# Patient Record
Sex: Male | Born: 1965 | Race: Black or African American | Hispanic: No | Marital: Single | Smoking: Former smoker
Health system: Southern US, Community
[De-identification: ages and names within clinical notes are randomized; demographics above are authoritative.]

## PROBLEM LIST (undated history)

## (undated) DIAGNOSIS — J45909 Unspecified asthma, uncomplicated: Secondary | ICD-10-CM

## (undated) DIAGNOSIS — K429 Umbilical hernia without obstruction or gangrene: Secondary | ICD-10-CM

## (undated) DIAGNOSIS — J189 Pneumonia, unspecified organism: Secondary | ICD-10-CM

## (undated) DIAGNOSIS — M1990 Unspecified osteoarthritis, unspecified site: Secondary | ICD-10-CM

## (undated) DIAGNOSIS — I10 Essential (primary) hypertension: Secondary | ICD-10-CM

## (undated) DIAGNOSIS — M4306 Spondylolysis, lumbar region: Secondary | ICD-10-CM

## (undated) DIAGNOSIS — C61 Malignant neoplasm of prostate: Secondary | ICD-10-CM

## (undated) DIAGNOSIS — E119 Type 2 diabetes mellitus without complications: Secondary | ICD-10-CM

## (undated) HISTORY — DX: Unspecified osteoarthritis, unspecified site: M19.90

## (undated) HISTORY — DX: Unspecified asthma, uncomplicated: J45.909

## (undated) HISTORY — DX: Essential (primary) hypertension: I10

---

## 1997-05-27 HISTORY — PX: SPINE SURGERY: SHX786

## 2016-06-20 ENCOUNTER — Ambulatory Visit: Payer: Self-pay | Admitting: Orthopedic Surgery

## 2016-06-27 ENCOUNTER — Encounter: Payer: Self-pay | Admitting: Orthopedic Surgery

## 2016-06-27 ENCOUNTER — Ambulatory Visit (INDEPENDENT_AMBULATORY_CARE_PROVIDER_SITE_OTHER): Payer: Self-pay | Admitting: Orthopedic Surgery

## 2016-06-27 ENCOUNTER — Ambulatory Visit (INDEPENDENT_AMBULATORY_CARE_PROVIDER_SITE_OTHER): Payer: 59

## 2016-06-27 VITALS — BP 169/111 | HR 93 | Wt 312.0 lb

## 2016-06-27 DIAGNOSIS — M25562 Pain in left knee: Secondary | ICD-10-CM

## 2016-06-27 DIAGNOSIS — M76899 Other specified enthesopathies of unspecified lower limb, excluding foot: Secondary | ICD-10-CM | POA: Diagnosis not present

## 2016-06-27 MED ORDER — PREDNISONE 10 MG PO TABS: 10.0000 mg | ORAL_TABLET | Freq: Two times a day (BID) | ORAL | 1 refills | Status: DC

## 2016-06-27 NOTE — Progress Notes (Signed)
Chief Complaint  Patient presents with  . 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 The knee is making noise. It's a dull pain is moderate in severity   Review of Systems  Musculoskeletal: Positive for back pain, joint pain and myalgias.  Neurological: Positive for tingling and sensory change.  All other systems reviewed and are negative.   Medical problems reported by the patient none  Surgeries reported by the patient 1998 back surgery  Family history reported by the patient no history of diabetes hypertension or heart disease Social History  Substance Use Topics  . Smoking status: Not on file  . Smokeless tobacco: Not on file  . Alcohol use Not on file   No outpatient prescriptions have been marked as taking for the 06/27/16 encounter (Office Visit) with Carole Civil, MD.    BP (!) 169/111   Pulse 93   Wt (!) 312 lb (141.5 kg)   Physical Exam  Constitutional: He is oriented to person, place, and time. He appears well-developed and well-nourished. No distress.  Cardiovascular: Normal rate and intact distal pulses.   Neurological: He is alert and oriented to person, place, and time.  Skin: Skin is warm and dry. No rash noted. He is not diaphoretic. No erythema. No pallor.  Psychiatric: He has a normal mood and affect. His behavior is normal. Judgment and thought content normal.    Ortho Exam No assistive gait needed no limping noted  Right knee is tender at the superior pole of patella with normal range of motion. All ligaments were tested and were stable. Strength was normal in the quadriceps tendon. Had normal skin without rash. Distally there was no peripheral edema normal pulses and sensation was intact and normal.  Right knee full range of motion and ligament  stable to anterior posterior drawer testing as well as collateral ligaments  ASSESSMENT: My personal interpretation of the images:  Is that he has moderate arthritis of the medial compartment  My diagnosis is quadriceps tendinitis    PLAN Plan is for prednisone 10 mg twice a day for 10 days of no improvement after 6 weeks and return for reevaluation  Arther Abbott, MD 06/27/2016 2:55 PM  .meds

## 2016-07-18 ENCOUNTER — Ambulatory Visit (INDEPENDENT_AMBULATORY_CARE_PROVIDER_SITE_OTHER): Payer: 59 | Admitting: Family Medicine

## 2016-07-18 ENCOUNTER — Encounter: Payer: Self-pay | Admitting: Family Medicine

## 2016-07-18 VITALS — BP 164/100 | HR 92 | Temp 98.8°F | Resp 20 | Ht 69.5 in | Wt 311.1 lb

## 2016-07-18 DIAGNOSIS — Z7689 Persons encountering health services in other specified circumstances: Secondary | ICD-10-CM

## 2016-07-18 DIAGNOSIS — Z1211 Encounter for screening for malignant neoplasm of colon: Secondary | ICD-10-CM | POA: Diagnosis not present

## 2016-07-18 DIAGNOSIS — Z23 Encounter for immunization: Secondary | ICD-10-CM

## 2016-07-18 DIAGNOSIS — M47816 Spondylosis without myelopathy or radiculopathy, lumbar region: Secondary | ICD-10-CM | POA: Insufficient documentation

## 2016-07-18 DIAGNOSIS — R030 Elevated blood-pressure reading, without diagnosis of hypertension: Secondary | ICD-10-CM | POA: Diagnosis not present

## 2016-07-18 NOTE — Progress Notes (Signed)
Chief Complaint  Patient presents with  . 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  Is overweight and this is discussed.  No colonoscopy in the past, this is scheduled Shots not up to date.  Getting a TdaP and a flu shot today  Recent visit to ortho for knee pain and is improving on a course of prednisone    Patient Active Problem List   Diagnosis Date Noted  . Morbid obesity ([facility]) 07/18/2016  . Elevated blood pressure reading 07/18/2016    Outpatient Encounter Prescriptions as of 07/18/2016  Medication Sig  . predniSONE (DELTASONE) 10 MG tablet Take 1 tablet (10 mg total) by mouth 2 (two) times daily with a meal.   No facility-administered encounter medications on file as of 07/18/2016.     Past Medical History:  Diagnosis Date  . Arthritis   . Asthma   . Hypertension     Past Surgical History:  Procedure Laterality Date  . SPINE SURGERY  05/1997   back fusion    Social History   Social History  . Marital status: Single    Spouse name: N/A  . Number of children: 0  . Years of education: 12   Occupational History  . truck driver    Social History Main Topics  . Smoking status: Never Smoker  . Smokeless tobacco: Never Used  . Alcohol use No  . Drug use: No  . Sexual activity: Not Currently   Other Topics Concern  . Not on file   Social History Narrative   Lives with brother    Family History  Problem Relation Age of Onset  . Heart disease Mother   . Stroke Mother   . Hypertension Mother   . Asthma Father   . Hypertension Brother     Review of Systems  Constitutional: Negative for chills, fever and weight loss.  HENT: Negative for congestion and hearing loss.   Eyes: Negative for blurred vision and pain.  Respiratory: Negative for cough and shortness of  breath.   Cardiovascular: Negative for chest pain and leg swelling.  Gastrointestinal: Negative for abdominal pain, constipation, diarrhea and heartburn.  Genitourinary: Negative for dysuria and frequency.  Musculoskeletal: Negative for falls, joint pain and myalgias.  Neurological: Negative for dizziness, seizures and headaches.  Psychiatric/Behavioral: Negative for depression. The patient is not nervous/anxious and does not have insomnia.     BP (!) 164/100 (BP Location: Right Arm, Patient Position: Sitting, Cuff Size: Large)   Pulse 92   Temp 98.8 F (37.1 C) (Oral)   Resp 20   Ht 5' 9.5" (1.765 m)   Wt (!) 311 lb 1.3 oz (141.1 kg)   SpO2 98%   BMI 45.28 kg/m   Physical Exam  Constitutional: He is oriented to person, place, and time. He appears well-developed and well-nourished.  obese  HENT:  Head: Normocephalic and atraumatic.  Right Ear: External ear normal.  Left Ear: External ear normal.  Mouth/Throat: Oropharynx is clear and moist.  Eyes: Conjunctivae are normal. Pupils are equal, round, and reactive to light.  Neck: Normal range of motion. Neck supple. No thyromegaly present.  Cardiovascular: Normal rate, regular rhythm and normal heart sounds.   Pulmonary/Chest: Effort normal and breath sounds normal. No respiratory distress.  Abdominal: Soft.  Bowel sounds are normal.  Musculoskeletal: Normal range of motion. He exhibits edema.  tr  Lymphadenopathy:    He has no cervical adenopathy.  Neurological: He is alert and oriented to person, place, and time.  Gait mildly antalgic  Skin: Skin is warm and dry.  Psychiatric: He has a normal mood and affect. His behavior is normal. Thought content normal.  Nursing note and vitals reviewed. ASSESSMENT/PLAN:   1. Encounter to establish care with new doctor   2. Elevated blood pressure reading Discussed likely hypertension.  Discussed lifestyle changes to reduce BP. - Hemoglobin A1c - Comprehensive metabolic panel -  CBC - Lipid panel - Vitamin D (25 hydroxy) - Urinalysis, Routine w reflex microscopic  3. Morbid obesity (HCC)  - Hemoglobin A1c - Comprehensive metabolic panel - CBC - Lipid panel - Vitamin D (25 hydroxy)  4. Colon cancer screening  - Ambulatory referral to Gastroenterology  5. Need for prophylactic vaccination and inoculation against influenza  - Flu Vaccine QUAD 36+ mos IM  6. Need for Tdap vaccination  - Tdap vaccine greater than or equal to 7yo IM   Patient Instructions  Need to followup on the blood pressure  Will schedule a colonoscopy  Need fasting blood work  See me in a month     Hypertension Hypertension, commonly called high blood pressure, is when the force of blood pumping through your arteries is too strong. Your arteries are the blood vessels that carry blood from your heart throughout your body. A blood pressure reading consists of a higher number over a lower number, such as 110/72. The higher number (systolic) is the pressure inside your arteries when your heart pumps. The lower number (diastolic) is the pressure inside your arteries when your heart relaxes. Ideally you want your blood pressure below 120/80. Hypertension forces your heart to work harder to pump blood. Your arteries may become narrow or stiff. Having untreated or uncontrolled hypertension can cause heart attack, stroke, kidney disease, and other problems. RISK FACTORS Some risk factors for high blood pressure are controllable. Others are not.  Risk factors you cannot control include:   Race. You may be at higher risk if you are African American.  Age. Risk increases with age.  Gender. Men are at higher risk than women before age 2 years. After age 16, women are at higher risk than men. Risk factors you can control include:  Not getting enough exercise or physical activity.  Being overweight.  Getting too much fat, sugar, calories, or salt in your diet.  Drinking too much  alcohol. SIGNS AND SYMPTOMS Hypertension does not usually cause signs or symptoms. Extremely high blood pressure (hypertensive crisis) may cause headache, anxiety, shortness of breath, and nosebleed. DIAGNOSIS To check if you have hypertension, your health care provider will measure your blood pressure while you are seated, with your arm held at the level of your heart. It should be measured at least twice using the same arm. Certain conditions can cause a difference in blood pressure between your right and left arms. A blood pressure reading that is higher than normal on one occasion does not mean that you need treatment. If it is not clear whether you have high blood pressure, you may be asked to return on a different day to have your blood pressure checked again. Or, you may be asked to monitor your blood pressure at home for 1 or more weeks. TREATMENT Treating high blood pressure includes making lifestyle changes and possibly taking medicine.  Living a healthy lifestyle can help lower high blood pressure. You may need to change some of your habits. Lifestyle changes may include:  Following the DASH diet. This diet is high in fruits, vegetables, and whole grains. It is low in salt, red meat, and added sugars.  Keep your sodium intake below 2,300 mg per day.  Getting at least 30-45 minutes of aerobic exercise at least 4 times per week.  Losing weight if necessary.  Not smoking.  Limiting alcoholic beverages.  Learning ways to reduce stress. Your health care provider may prescribe medicine if lifestyle changes are not enough to get your blood pressure under control, and if one of the following is true:  You are 106-64 years of age and your systolic blood pressure is above 140.  You are 60 years of age or older, and your systolic blood pressure is above 150.  Your diastolic blood pressure is above 90.  You have diabetes, and your systolic blood pressure is over [id number] or your diastolic blood  pressure is over 90.  You have kidney disease and your blood pressure is above 140/90.  You have heart disease and your blood pressure is above 140/90. Your personal target blood pressure may vary depending on your medical conditions, your age, and other factors. HOME CARE INSTRUCTIONS  Have your blood pressure rechecked as directed by your health care provider.   Take medicines only as directed by your health care provider. Follow the directions carefully. Blood pressure medicines must be taken as prescribed. The medicine does not work as well when you skip doses. Skipping doses also puts you at risk for problems.  Do not smoke.   Monitor your blood pressure at home as directed by your health care provider. SEEK MEDICAL CARE IF:   You think you are having a reaction to medicines taken.  You have recurrent headaches or feel dizzy.  You have swelling in your ankles.  You have trouble with your vision. SEEK IMMEDIATE MEDICAL CARE IF:  You develop a severe headache or confusion.  You have unusual weakness, numbness, or feel faint.  You have severe chest or abdominal pain.  You vomit repeatedly.  You have trouble breathing. MAKE SURE YOU:   Understand these instructions.  Will watch your condition.  Will get help right away if you are not doing well or get worse.   This information is not intended to replace advice given to you by your health care provider. Make sure you discuss any questions you have with your health care provider.   Document Released: 09/12/2005 Document Revised: 01/27/2015 Document Reviewed: 07/05/2013 Elsevier Interactive Patient Education 2016 [place] DASH stands for "Dietary Approaches to Stop Hypertension." The DASH eating plan is a healthy eating plan that has been shown to reduce high blood pressure (hypertension). Additional health benefits may include reducing the risk of type 2 diabetes mellitus, heart disease, and  stroke. The DASH eating plan may also help with weight loss. WHAT DO I NEED TO KNOW ABOUT THE DASH EATING PLAN? For the DASH eating plan, you will follow these general guidelines:  Choose foods with a percent daily value for sodium of less than 5% (as listed on the food label).  Use salt-free seasonings or herbs instead of table salt or sea salt.  Check with your health care provider or pharmacist before using salt substitutes.  Eat lower-sodium products, often labeled as "lower sodium" or "no salt added."  Eat fresh foods.  Eat more  vegetables, fruits, and low-fat dairy products.  Choose whole grains. Look for the word "whole" as the first word in the ingredient list.  Choose fish and skinless chicken or [place] more often than red meat. Limit fish, poultry, and meat to 6 oz (170 g) each day.  Limit sweets, desserts, sugars, and sugary drinks.  Choose heart-healthy fats.  Limit cheese to 1 oz (28 g) per day.  Eat more home-cooked food and less restaurant, buffet, and fast food.  Limit fried foods.  Cook foods using methods other than frying.  Limit canned vegetables. If you do use them, rinse them well to decrease the sodium.  When eating at a restaurant, ask that your food be prepared with less salt, or no salt if possible. WHAT FOODS CAN I EAT? Seek help from a dietitian for individual calorie needs. Grains Whole grain or whole wheat bread. Brown rice. Whole grain or whole wheat pasta. Quinoa, bulgur, and whole grain cereals. Low-sodium cereals. Corn or whole wheat flour tortillas. Whole grain cornbread. Whole grain crackers. Low-sodium crackers. Vegetables Fresh or frozen vegetables (raw, steamed, roasted, or grilled). Low-sodium or reduced-sodium tomato and vegetable juices. Low-sodium or reduced-sodium tomato sauce and paste. Low-sodium or reduced-sodium canned vegetables.  Fruits All fresh, canned (in natural juice), or frozen fruits. Meat and Other Protein  Products Ground beef (85% or leaner), grass-fed beef, or beef trimmed of fat. Skinless chicken or [place]. Ground chicken or [place]. Pork trimmed of fat. All fish and seafood. Eggs. Dried beans, peas, or lentils. Unsalted nuts and seeds. Unsalted canned beans. Dairy Low-fat dairy products, such as skim or 1% milk, 2% or reduced-fat cheeses, low-fat ricotta or cottage cheese, or plain low-fat yogurt. Low-sodium or reduced-sodium cheeses. Fats and Oils Tub margarines without trans fats. Light or reduced-fat mayonnaise and salad dressings (reduced sodium). Avocado. Safflower, olive, or canola oils. Natural peanut or almond butter. Other Unsalted popcorn and pretzels. The items listed above may not be a complete list of recommended foods or beverages. Contact your dietitian for more options. WHAT FOODS ARE NOT RECOMMENDED? Grains White bread. White pasta. White rice. Refined cornbread. Bagels and croissants. Crackers that contain trans fat. Vegetables Creamed or fried vegetables. Vegetables in a cheese sauce. Regular canned vegetables. Regular canned tomato sauce and paste. Regular tomato and vegetable juices. Fruits Dried fruits. Canned fruit in light or heavy syrup. Fruit juice. Meat and Other Protein Products Fatty cuts of meat. Ribs, chicken wings, bacon, sausage, bologna, salami, chitterlings, fatback, hot dogs, bratwurst, and packaged luncheon meats. Salted nuts and seeds. Canned beans with salt. Dairy Whole or 2% milk, cream, half-and-half, and cream cheese. Whole-fat or sweetened yogurt. Full-fat cheeses or blue cheese. Nondairy creamers and whipped toppings. Processed cheese, cheese spreads, or cheese curds. Condiments Onion and garlic salt, seasoned salt, table salt, and sea salt. Canned and packaged gravies. Worcestershire sauce. Tartar sauce. Barbecue sauce. Teriyaki sauce. Soy sauce, including reduced sodium. Steak sauce. Fish sauce. Oyster sauce. Cocktail sauce. Horseradish. Ketchup and  mustard. Meat flavorings and tenderizers. Bouillon cubes. Hot sauce. Tabasco sauce. Marinades. Taco seasonings. Relishes. Fats and Oils Butter, stick margarine, lard, shortening, ghee, and bacon fat. Coconut, palm kernel, or palm oils. Regular salad dressings. Other Pickles and olives. Salted popcorn and pretzels. The items listed above may not be a complete list of foods and beverages to avoid. Contact your dietitian for more information. WHERE CAN I FIND MORE INFORMATION? National Heart, Lung, and Blood Institute: travelstabloid.com   This information is not intended to replace  advice given to you by your health care provider. Make sure you discuss any questions you have with your health care provider.   Document Released: 09/01/2011 Document Revised: 10/03/2014 Document Reviewed: 07/17/2013 Elsevier Interactive Patient Education 2016 [employer]    Raylene Everts, MD

## 2016-07-18 NOTE — Patient Instructions (Addendum)
Need to followup on the blood pressure  Will schedule a colonoscopy  Need fasting blood work  See me in a month     Hypertension Hypertension, commonly called high blood pressure, is when the force of blood pumping through your arteries is too strong. Your arteries are the blood vessels that carry blood from your heart throughout your body. A blood pressure reading consists of a higher number over a lower number, such as 110/72. The higher number (systolic) is the pressure inside your arteries when your heart pumps. The lower number (diastolic) is the pressure inside your arteries when your heart relaxes. Ideally you want your blood pressure below 120/80. Hypertension forces your heart to work harder to pump blood. Your arteries may become narrow or stiff. Having untreated or uncontrolled hypertension can cause heart attack, stroke, kidney disease, and other problems. RISK FACTORS Some risk factors for high blood pressure are controllable. Others are not.  Risk factors you cannot control include:   Race. You may be at higher risk if you are African American.  Age. Risk increases with age.  Gender. Men are at higher risk than women before age 65 years. After age 24, women are at higher risk than men. Risk factors you can control include:  Not getting enough exercise or physical activity.  Being overweight.  Getting too much fat, sugar, calories, or salt in your diet.  Drinking too much alcohol. SIGNS AND SYMPTOMS Hypertension does not usually cause signs or symptoms. Extremely high blood pressure (hypertensive crisis) may cause headache, anxiety, shortness of breath, and nosebleed. DIAGNOSIS To check if you have hypertension, your health care provider will measure your blood pressure while you are seated, with your arm held at the level of your heart. It should be measured at least twice using the same arm. Certain conditions can cause a difference in blood pressure between your  right and left arms. A blood pressure reading that is higher than normal on one occasion does not mean that you need treatment. If it is not clear whether you have high blood pressure, you may be asked to return on a different day to have your blood pressure checked again. Or, you may be asked to monitor your blood pressure at home for 1 or more weeks. TREATMENT Treating high blood pressure includes making lifestyle changes and possibly taking medicine. Living a healthy lifestyle can help lower high blood pressure. You may need to change some of your habits. Lifestyle changes may include:  Following the DASH diet. This diet is high in fruits, vegetables, and whole grains. It is low in salt, red meat, and added sugars.  Keep your sodium intake below 2,300 mg per day.  Getting at least 30-45 minutes of aerobic exercise at least 4 times per week.  Losing weight if necessary.  Not smoking.  Limiting alcoholic beverages.  Learning ways to reduce stress. Your health care provider may prescribe medicine if lifestyle changes are not enough to get your blood pressure under control, and if one of the following is true:  You are 84-67 years of age and your systolic blood pressure is above 140.  You are 44 years of age or older, and your systolic blood pressure is above 150.  Your diastolic blood pressure is above 90.  You have diabetes, and your systolic blood pressure is over [id number] or your diastolic blood pressure is over 90.  You have kidney disease and your blood pressure is above 140/90.  You have heart disease  and your blood pressure is above 140/90. Your personal target blood pressure may vary depending on your medical conditions, your age, and other factors. HOME CARE INSTRUCTIONS  Have your blood pressure rechecked as directed by your health care provider.   Take medicines only as directed by your health care provider. Follow the directions carefully. Blood pressure medicines must be  taken as prescribed. The medicine does not work as well when you skip doses. Skipping doses also puts you at risk for problems.  Do not smoke.   Monitor your blood pressure at home as directed by your health care provider. SEEK MEDICAL CARE IF:   You think you are having a reaction to medicines taken.  You have recurrent headaches or feel dizzy.  You have swelling in your ankles.  You have trouble with your vision. SEEK IMMEDIATE MEDICAL CARE IF:  You develop a severe headache or confusion.  You have unusual weakness, numbness, or feel faint.  You have severe chest or abdominal pain.  You vomit repeatedly.  You have trouble breathing. MAKE SURE YOU:   Understand these instructions.  Will watch your condition.  Will get help right away if you are not doing well or get worse.   This information is not intended to replace advice given to you by your health care provider. Make sure you discuss any questions you have with your health care provider.   Document Released: 09/12/2005 Document Revised: 01/27/2015 Document Reviewed: 07/05/2013 Elsevier Interactive Patient Education 2016 [place] DASH stands for "Dietary Approaches to Stop Hypertension." The DASH eating plan is a healthy eating plan that has been shown to reduce high blood pressure (hypertension). Additional health benefits may include reducing the risk of type 2 diabetes mellitus, heart disease, and stroke. The DASH eating plan may also help with weight loss. WHAT DO I NEED TO KNOW ABOUT THE DASH EATING PLAN? For the DASH eating plan, you will follow these general guidelines:  Choose foods with a percent daily value for sodium of less than 5% (as listed on the food label).  Use salt-free seasonings or herbs instead of table salt or sea salt.  Check with your health care provider or pharmacist before using salt substitutes.  Eat lower-sodium products, often labeled as "lower sodium" or "no  salt added."  Eat fresh foods.  Eat more vegetables, fruits, and low-fat dairy products.  Choose whole grains. Look for the word "whole" as the first word in the ingredient list.  Choose fish and skinless chicken or [place] more often than red meat. Limit fish, poultry, and meat to 6 oz (170 g) each day.  Limit sweets, desserts, sugars, and sugary drinks.  Choose heart-healthy fats.  Limit cheese to 1 oz (28 g) per day.  Eat more home-cooked food and less restaurant, buffet, and fast food.  Limit fried foods.  Cook foods using methods other than frying.  Limit canned vegetables. If you do use them, rinse them well to decrease the sodium.  When eating at a restaurant, ask that your food be prepared with less salt, or no salt if possible. WHAT FOODS CAN I EAT? Seek help from a dietitian for individual calorie needs. Grains Whole grain or whole wheat bread. Brown rice. Whole grain or whole wheat pasta. Quinoa, bulgur, and whole grain cereals. Low-sodium cereals. Corn or whole wheat flour tortillas. Whole grain cornbread. Whole grain crackers. Low-sodium crackers. Vegetables Fresh or frozen vegetables (raw, steamed, roasted, or grilled). Low-sodium or reduced-sodium tomato and  vegetable juices. Low-sodium or reduced-sodium tomato sauce and paste. Low-sodium or reduced-sodium canned vegetables.  Fruits All fresh, canned (in natural juice), or frozen fruits. Meat and Other Protein Products Ground beef (85% or leaner), grass-fed beef, or beef trimmed of fat. Skinless chicken or [place]. Ground chicken or [place]. Pork trimmed of fat. All fish and seafood. Eggs. Dried beans, peas, or lentils. Unsalted nuts and seeds. Unsalted canned beans. Dairy Low-fat dairy products, such as skim or 1% milk, 2% or reduced-fat cheeses, low-fat ricotta or cottage cheese, or plain low-fat yogurt. Low-sodium or reduced-sodium cheeses. Fats and Oils Tub margarines without trans fats. Light or reduced-fat  mayonnaise and salad dressings (reduced sodium). Avocado. Safflower, olive, or canola oils. Natural peanut or almond butter. Other Unsalted popcorn and pretzels. The items listed above may not be a complete list of recommended foods or beverages. Contact your dietitian for more options. WHAT FOODS ARE NOT RECOMMENDED? Grains White bread. White pasta. White rice. Refined cornbread. Bagels and croissants. Crackers that contain trans fat. Vegetables Creamed or fried vegetables. Vegetables in a cheese sauce. Regular canned vegetables. Regular canned tomato sauce and paste. Regular tomato and vegetable juices. Fruits Dried fruits. Canned fruit in light or heavy syrup. Fruit juice. Meat and Other Protein Products Fatty cuts of meat. Ribs, chicken wings, bacon, sausage, bologna, salami, chitterlings, fatback, hot dogs, bratwurst, and packaged luncheon meats. Salted nuts and seeds. Canned beans with salt. Dairy Whole or 2% milk, cream, half-and-half, and cream cheese. Whole-fat or sweetened yogurt. Full-fat cheeses or blue cheese. Nondairy creamers and whipped toppings. Processed cheese, cheese spreads, or cheese curds. Condiments Onion and garlic salt, seasoned salt, table salt, and sea salt. Canned and packaged gravies. Worcestershire sauce. Tartar sauce. Barbecue sauce. Teriyaki sauce. Soy sauce, including reduced sodium. Steak sauce. Fish sauce. Oyster sauce. Cocktail sauce. Horseradish. Ketchup and mustard. Meat flavorings and tenderizers. Bouillon cubes. Hot sauce. Tabasco sauce. Marinades. Taco seasonings. Relishes. Fats and Oils Butter, stick margarine, lard, shortening, ghee, and bacon fat. Coconut, palm kernel, or palm oils. Regular salad dressings. Other Pickles and olives. Salted popcorn and pretzels. The items listed above may not be a complete list of foods and beverages to avoid. Contact your dietitian for more information. WHERE CAN I FIND MORE INFORMATION? National Heart, Lung, and  Blood Institute: travelstabloid.com   This information is not intended to replace advice given to you by your health care provider. Make sure you discuss any questions you have with your health care provider.   Document Released: 09/01/2011 Document Revised: 10/03/2014 Document Reviewed: 07/17/2013 Elsevier Interactive Patient Education [employer].

## 2016-08-10 ENCOUNTER — Telehealth: Payer: Self-pay

## 2016-08-10 NOTE — Telephone Encounter (Signed)
Pt received triage letter from DS. Please call [phone]

## 2016-08-11 ENCOUNTER — Telehealth: Payer: Self-pay

## 2016-08-11 LAB — URINALYSIS, ROUTINE W REFLEX MICROSCOPIC
BILIRUBIN URINE: NEGATIVE
Glucose, UA: NEGATIVE
Hgb urine dipstick: NEGATIVE
KETONES UR: NEGATIVE
Leukocytes, UA: NEGATIVE
NITRITE: NEGATIVE
PROTEIN: NEGATIVE
Specific Gravity, Urine: 1.023 (ref 1.001–1.035)
pH: 5 (ref 5.0–8.0)

## 2016-08-11 LAB — CBC
HCT: 44.2 % (ref 38.5–50.0)
Hemoglobin: 14.8 g/dL (ref 13.2–17.1)
MCH: 30.6 pg (ref 27.0–33.0)
MCHC: 33.5 g/dL (ref 32.0–36.0)
MCV: 91.3 fL (ref 80.0–100.0)
MPV: 10.7 fL (ref 7.5–12.5)
PLATELETS: 384 10*3/uL (ref 140–400)
RBC: 4.84 MIL/uL (ref 4.20–5.80)
RDW: 13.6 % (ref 11.0–15.0)
WBC: 9 10*3/uL (ref 3.8–10.8)

## 2016-08-11 LAB — HEMOGLOBIN A1C
Hgb A1c MFr Bld: 7 % — ABNORMAL HIGH (ref <5.7)
Mean Plasma Glucose: 154 mg/dL

## 2016-08-12 LAB — LIPID PANEL
CHOL/HDL RATIO: 4.6 ratio (ref <5.0)
CHOLESTEROL: 170 mg/dL (ref <200)
HDL: 37 mg/dL — AB (ref >40)
LDL Cholesterol: 117 mg/dL — ABNORMAL HIGH (ref <100)
Triglycerides: 78 mg/dL (ref <150)
VLDL: 16 mg/dL (ref <30)

## 2016-08-12 LAB — COMPREHENSIVE METABOLIC PANEL
ALBUMIN: 4 g/dL (ref 3.6–5.1)
ALT: 15 U/L (ref 9–46)
AST: 12 U/L (ref 10–35)
Alkaline Phosphatase: 74 U/L (ref 40–115)
BILIRUBIN TOTAL: 0.5 mg/dL (ref 0.2–1.2)
BUN: 19 mg/dL (ref 7–25)
CALCIUM: 9.6 mg/dL (ref 8.6–10.3)
CO2: 29 mmol/L (ref 20–31)
Chloride: 102 mmol/L (ref 98–110)
Creat: 1.24 mg/dL (ref 0.70–1.33)
Glucose, Bld: 131 mg/dL — ABNORMAL HIGH (ref 65–99)
Potassium: 4.3 mmol/L (ref 3.5–5.3)
Sodium: 138 mmol/L (ref 135–146)
Total Protein: 7.1 g/dL (ref 6.1–8.1)

## 2016-08-12 LAB — URINE CULTURE: Organism ID, Bacteria: NO GROWTH

## 2016-08-12 NOTE — Telephone Encounter (Signed)
PREPOPIK-DRINK WATER TO KEEP URINE LIGHT YELLOW.  Full Liquid Diet A high-calorie, high-protein supplement should be used to meet your nutritional requirements when the full liquid diet is continued for more than 2 or 3 days. If this diet is to be used for an extended period of time (more than 7 days), a multivitamin should be considered.  Breads and Starches  Allowed: None are allowed   Avoid: Any others.    Potatoes/Pasta/Rice  Allowed: ANY ITEM AS A SOUP OR SMALL PLATE OF MASHED POTATOES OR SCRAMBLED EGGS. (DO NOT EAT MORE THAN ONE SERVING ON THE DAY BEFORE COLONOSCOPY).      Vegetables  Allowed: Strained tomato or vegetable juice. Vegetables pureed in soup.   Avoid: Any others.    Fruit  Allowed: Any strained fruit juices and fruit drinks. Include 1 serving of citrus or vitamin C-enriched fruit juice daily.   Avoid: Any others.  Meat and Meat Substitutes  Allowed: Egg  Avoid: Any meat, fish, or fowl. All cheese.  Milk  Allowed: SOY Milk beverages, including milk shakes and instant breakfast mixes. Smooth yogurt.   Avoid: Any others. Avoid dairy products if not tolerated.    Soups and Combination Foods  Allowed: Broth, strained cream soups. Strained, broth-based soups.   Avoid: Any others.    Desserts and Sweets  Allowed: flavored gelatin, tapioca, ice cream, sherbet, smooth pudding, junket, fruit ices, frozen ice pops, pudding pops, frozen fudge pops, chocolate syrup. Sugar, honey, jelly, syrup.   Avoid: Any others.  Fats and Oils  Allowed: Margarine, butter, cream, sour cream, oils.   Avoid: Any others.  Beverages  Allowed: All.   Avoid: None.  Condiments  Allowed: Iodized salt, pepper, spices, flavorings. Cocoa powder.   Avoid: Any others.    SAMPLE MEAL PLAN Breakfast   cup orange juice.   1 OR 2 EGGS  1 cup milk.   1 cup beverage (coffee or tea).   Cream or sugar, if desired.    Midmorning Snack  2 SCRAMBLED OR HARD  BOILED EGG   Lunch  1 cup cream soup.    cup fruit juice.   1 cup milk.    cup custard.   1 cup beverage (coffee or tea).   Cream or sugar, if desired.    Midafternoon Snack  1 cup milk shake.  Dinner  1 cup cream soup.    cup fruit juice.   1 cup MILK    cup pudding.   1 cup beverage (coffee or tea).   Cream or sugar, if desired.  Evening Snack  1 cup supplement.  To increase calories, add sugar, cream, butter, or margarine if possible. Nutritional supplements will also increase the total calories.

## 2016-08-12 NOTE — Telephone Encounter (Signed)
Gastroenterology Pre-Procedure Review  Request Date: 08/11/2016  Requesting Physician: Dr. Oneida Alar   PATIENT REVIEW QUESTIONS: The patient responded to the following health history questions as indicated:    1. Diabetes Melitis: no 2. Joint replacements in the past 12 months: no 3. Major health problems in the past 3 months: no 4. Has an artificial valve or MVP: no 5. Has a defibrillator: no 6. Has been advised in past to take antibiotics in advance of a procedure like teeth cleaning: no 7. Family history of colon cancer: no  8. Alcohol Use: Maybe once a week he will have 4-5 beers 9. History of sleep apnea: no  10. History of coronary artery or other vascular stents placed within the last 12 months: no    MEDICATIONS & ALLERGIES:    Patient reports the following regarding taking any blood thinners:   Plavix? no Aspirin? no Coumadin? no Brilinta? no Xarelto? no Eliquis? no Pradaxa? no Savaysa? no Effient? no  Patient confirms/reports the following medications:  Current Outpatient Prescriptions  Medication Sig Dispense Refill  . naproxen sodium (ANAPROX) 220 MG tablet Take 220 mg by mouth 2 (two) times daily with a meal. Takes only as needed    . predniSONE (DELTASONE) 10 MG tablet Take 1 tablet (10 mg total) by mouth 2 (two) times daily with a meal. (Patient not taking: Reported on 08/11/2016) 20 tablet 1   No current facility-administered medications for this visit.     Patient confirms/reports the following allergies:  No Known Allergies  No orders of the defined types were placed in this encounter.   AUTHORIZATION INFORMATION Primary Insurance:   ID #:   Group #:  Pre-Cert / Auth required:  Pre-Cert / Auth #:   Secondary Insurance: ,  ID #: ,  Group #:  Pre-Cert / Auth required: Pre-Cert / Auth #:  SCHEDULE INFORMATION: Procedure has been scheduled as follows:  Date: 09/16/2016            Time:  1:00 PM Location: [facility] Short Stay  This  Gastroenterology Pre-Precedure Review Form is being routed to the following provider: Dr. Oneida Alar

## 2016-08-13 LAB — VITAMIN D 25 HYDROXY (VIT D DEFICIENCY, FRACTURES): VIT D 25 HYDROXY: 12 ng/mL — AB (ref 30–100)

## 2016-08-15 ENCOUNTER — Other Ambulatory Visit: Payer: Self-pay

## 2016-08-15 ENCOUNTER — Encounter: Payer: Self-pay | Admitting: Family Medicine

## 2016-08-15 DIAGNOSIS — Z1211 Encounter for screening for malignant neoplasm of colon: Secondary | ICD-10-CM

## 2016-08-15 MED ORDER — SOD PICOSULFATE-MAG OX-CIT ACD 10-3.5-12 MG-GM-GM PO PACK: 1.0000 | PACK | Freq: Once | ORAL | 0 refills | Status: AC

## 2016-08-15 NOTE — Telephone Encounter (Signed)
See separate triage.  

## 2016-08-15 NOTE — Telephone Encounter (Signed)
Rx sent to the pharmacy and instructions mailed to pt.  

## 2016-08-22 ENCOUNTER — Ambulatory Visit: Payer: 59 | Admitting: Family Medicine

## 2016-08-23 MED ORDER — PEG 3350-KCL-NA BICARB-NACL 420 G PO SOLR: 4000.0000 mL | ORAL | 0 refills | Status: DC

## 2016-08-23 NOTE — Addendum Note (Signed)
Addended by: Everardo All on: 08/23/2016 11:43 AM   Modules accepted: Orders

## 2016-08-23 NOTE — Telephone Encounter (Signed)
Prepopik not covered by insurance. Sent in [place] and mailed new instructions with note to use these instructions and to call if questions.

## 2016-09-13 ENCOUNTER — Telehealth: Payer: Self-pay

## 2016-09-13 NOTE — Telephone Encounter (Signed)
Info submitted online for the PA for the screening colonoscopy. Ref # [id number].

## 2016-09-16 ENCOUNTER — Encounter (HOSPITAL_COMMUNITY)
Admission: RE | Disposition: A | Discharge status: Home / Self Care | Payer: Self-pay | Source: Ambulatory Visit | Attending: Gastroenterology

## 2016-09-16 ENCOUNTER — Encounter (HOSPITAL_COMMUNITY): Payer: Self-pay | Admitting: *Deleted

## 2016-09-16 ENCOUNTER — Ambulatory Visit (HOSPITAL_COMMUNITY)
Admission: RE | Admit: 2016-09-16 | Discharge: 2016-09-16 | Disposition: A | Discharge status: Home / Self Care | Payer: 59 | Source: Ambulatory Visit | Attending: Gastroenterology | Admitting: Gastroenterology

## 2016-09-16 DIAGNOSIS — J45909 Unspecified asthma, uncomplicated: Secondary | ICD-10-CM | POA: Insufficient documentation

## 2016-09-16 DIAGNOSIS — D122 Benign neoplasm of ascending colon: Secondary | ICD-10-CM | POA: Insufficient documentation

## 2016-09-16 DIAGNOSIS — Q438 Other specified congenital malformations of intestine: Secondary | ICD-10-CM | POA: Diagnosis not present

## 2016-09-16 DIAGNOSIS — Z1211 Encounter for screening for malignant neoplasm of colon: Secondary | ICD-10-CM | POA: Insufficient documentation

## 2016-09-16 DIAGNOSIS — K648 Other hemorrhoids: Secondary | ICD-10-CM | POA: Diagnosis not present

## 2016-09-16 DIAGNOSIS — D124 Benign neoplasm of descending colon: Secondary | ICD-10-CM | POA: Diagnosis not present

## 2016-09-16 DIAGNOSIS — K635 Polyp of colon: Secondary | ICD-10-CM | POA: Diagnosis not present

## 2016-09-16 DIAGNOSIS — Z1212 Encounter for screening for malignant neoplasm of rectum: Secondary | ICD-10-CM | POA: Diagnosis not present

## 2016-09-16 DIAGNOSIS — I10 Essential (primary) hypertension: Secondary | ICD-10-CM | POA: Insufficient documentation

## 2016-09-16 DIAGNOSIS — K621 Rectal polyp: Secondary | ICD-10-CM

## 2016-09-16 DIAGNOSIS — Z7689 Persons encountering health services in other specified circumstances: Secondary | ICD-10-CM

## 2016-09-16 DIAGNOSIS — D123 Benign neoplasm of transverse colon: Secondary | ICD-10-CM

## 2016-09-16 HISTORY — PX: COLONOSCOPY: SHX5424

## 2016-09-16 HISTORY — PX: POLYPECTOMY: SHX5525

## 2016-09-16 SURGERY — COLONOSCOPY
Anesthesia: Moderate Sedation

## 2016-09-16 MED ORDER — MIDAZOLAM HCL 5 MG/5ML IJ SOLN
INTRAMUSCULAR | Status: DC | PRN
  Administered 2016-09-16: 1 mg via INTRAVENOUS
  Administered 2016-09-16 (×2): 2 mg via INTRAVENOUS

## 2016-09-16 MED ORDER — MEPERIDINE HCL 100 MG/ML IJ SOLN
INTRAMUSCULAR | Status: AC
  Filled 2016-09-16: qty 2

## 2016-09-16 MED ORDER — MEPERIDINE HCL 100 MG/ML IJ SOLN
INTRAMUSCULAR | Status: DC | PRN
  Administered 2016-09-16 (×3): 25 mg via INTRAVENOUS

## 2016-09-16 MED ORDER — MIDAZOLAM HCL 5 MG/5ML IJ SOLN
INTRAMUSCULAR | Status: AC
  Filled 2016-09-16: qty 10

## 2016-09-16 MED ORDER — STERILE WATER FOR IRRIGATION IR SOLN
Status: DC | PRN
  Administered 2016-09-16: 2.5 mL

## 2016-09-16 MED ORDER — SODIUM CHLORIDE 0.9 % IV SOLN
INTRAVENOUS | Status: DC
  Administered 2016-09-16: 1000 mL via INTRAVENOUS

## 2016-09-16 NOTE — Discharge Instructions (Signed)
You had 8 polyps removed. You have internal hemorrhoids.   CONTINUE YOUR WEIGHT LOSS EFFORTS. . WHILE I DO NOT WANT TO ALARM YOU, YOUR BODY MASS INDEX IS OVER 40 WHICH MEANS YOU ARE MORBIDLY OBESE.  OBESITY DRIVES CANCER GENES AND IS ASSOCIATED WITH AN INCREASE RISK FOR ALL CANCERS, INCLUDING ESOPHAGEAL AND COLON CANCER. LOSE TEN TO TWENTY POUNDS.  DRINK WATER TO KEEP YOUR URINE LIGHT YELLOW.  FOLLOW A HIGH FIBER DIET. AVOID ITEMS THAT CAUSE BLOATING & GAS. SEE INFO BELOW.  YOUR BIOPSY RESULTS WILL BE AVAILABLE IN MY CHART AFTER DEC 28 AND MY OFFICE WILL CONTACT YOU IN 10-14 DAYS WITH YOUR RESULTS.   Next colonoscopy in 3 years. YOUR SISTERS, BROTHERS, CHILDREN, AND PARENTS NEED TO HAVE A COLONOSCOPY STARTING AT THE AGE OF 40.    Colonoscopy Care After Read the instructions outlined below and refer to this sheet in the next week. These discharge instructions provide you with general information on caring for yourself after you leave the hospital. While your treatment has been planned according to the most current medical practices available, unavoidable complications occasionally occur. If you have any problems or questions after discharge, call DR. Jakyiah Briones, [phone].  ACTIVITY  You may resume your regular activity, but move at a slower pace for the next 24 hours.   Take frequent rest periods for the next 24 hours.   Walking will help get rid of the air and reduce the bloated feeling in your belly (abdomen).   No driving for 24 hours (because of the medicine (anesthesia) used during the test).   You may shower.   Do not sign any important legal documents or operate any machinery for 24 hours (because of the anesthesia used during the test).    NUTRITION  Drink plenty of fluids.   You may resume your normal diet as instructed by your doctor.   Begin with a light meal and progress to your normal diet. Heavy or fried foods are harder to digest and may make you feel sick to your  stomach (nauseated).   Avoid alcoholic beverages for 24 hours or as instructed.    MEDICATIONS  You may resume your normal medications.   WHAT YOU CAN EXPECT TODAY  Some feelings of bloating in the abdomen.   Passage of more gas than usual.   Spotting of blood in your stool or on the toilet paper  .  IF YOU HAD POLYPS REMOVED DURING THE COLONOSCOPY:  Eat a soft diet IF YOU HAVE NAUSEA, BLOATING, ABDOMINAL PAIN, OR VOMITING.    FINDING OUT THE RESULTS OF YOUR TEST Not all test results are available during your visit. DR. Oneida Alar WILL CALL YOU WITHIN 14 DAYS OF YOUR PROCEDUE WITH YOUR RESULTS. Do not assume everything is normal if you have not heard from DR. Jaysean Manville, CALL HER OFFICE AT [phone].  SEEK IMMEDIATE MEDICAL ATTENTION AND CALL THE OFFICE: [phone] IF:  You have more than a spotting of blood in your stool.   Your belly is swollen (abdominal distention).   You are nauseated or vomiting.   You have a temperature over 101F.   You have abdominal pain or discomfort that is severe or gets worse throughout the day.   High-Fiber Diet A high-fiber diet changes your normal diet to include more whole grains, legumes, fruits, and vegetables. Changes in the diet involve replacing refined carbohydrates with unrefined foods. The calorie level of the diet is essentially unchanged. The Dietary Reference Intake (recommended amount) for adult  males is 38 grams per day. For adult females, it is 25 grams per day. Pregnant and lactating women should consume 28 grams of fiber per day. Fiber is the intact part of a plant that is not broken down during digestion. Functional fiber is fiber that has been isolated from the plant to provide a beneficial effect in the body. PURPOSE  Increase stool bulk.   Ease and regulate bowel movements.   Lower cholesterol.   REDUCE RISK OF COLON CANCER  INDICATIONS THAT YOU NEED MORE FIBER  Constipation and hemorrhoids.   Uncomplicated  diverticulosis (intestine condition) and irritable bowel syndrome.   Weight management.   As a protective measure against hardening of the arteries (atherosclerosis), diabetes, and cancer.   GUIDELINES FOR INCREASING FIBER IN THE DIET  Start adding fiber to the diet slowly. A gradual increase of about 5 more grams (2 slices of whole-wheat bread, 2 servings of most fruits or vegetables, or 1 bowl of high-fiber cereal) per day is best. Too rapid an increase in fiber may result in constipation, flatulence, and bloating.   Drink enough water and fluids to keep your urine clear or pale yellow. Water, juice, or caffeine-free drinks are recommended. Not drinking enough fluid may cause constipation.   Eat a variety of high-fiber foods rather than one type of fiber.   Try to increase your intake of fiber through using high-fiber foods rather than fiber pills or supplements that contain small amounts of fiber.   The goal is to change the types of food eaten. Do not supplement your present diet with high-fiber foods, but replace foods in your present diet.   INCLUDE A VARIETY OF FIBER SOURCES  Replace refined and processed grains with whole grains, canned fruits with fresh fruits, and incorporate other fiber sources. White rice, white breads, and most bakery goods contain little or no fiber.   Brown whole-grain rice, buckwheat oats, and many fruits and vegetables are all good sources of fiber. These include: broccoli, Brussels sprouts, cabbage, cauliflower, beets, sweet potatoes, white potatoes (skin on), carrots, tomatoes, eggplant, squash, berries, fresh fruits, and dried fruits.   Cereals appear to be the richest source of fiber. Cereal fiber is found in whole grains and bran. Bran is the fiber-rich outer coat of cereal grain, which is largely removed in refining. In whole-grain cereals, the bran remains. In breakfast cereals, the largest amount of fiber is found in those with "bran" in their names.  The fiber content is sometimes indicated on the label.   You may need to include additional fruits and vegetables each day.   In baking, for 1 cup white flour, you may use the following substitutions:   1 cup whole-wheat flour minus 2 tablespoons.   1/2 cup white flour plus 1/2 cup whole-wheat flour.   Polyps, Colon  A polyp is extra tissue that grows inside your body. Colon polyps grow in the large intestine. The large intestine, also called the colon, is part of your digestive system. It is a long, hollow tube at the end of your digestive tract where your body makes and stores stool. Most polyps are not dangerous. They are benign. This means they are not cancerous. But over time, some types of polyps can turn into cancer. Polyps that are smaller than a pea are usually not harmful. But larger polyps could someday become or may already be cancerous. To be safe, doctors remove all polyps and test them.   PREVENTION There is not one sure way  to prevent polyps. You might be able to lower your risk of getting them if you:  Eat more fruits and vegetables and less fatty food.   Do not smoke.   Avoid alcohol.   Exercise every day.   Lose weight if you are overweight.   Eating more calcium and folate can also lower your risk of getting polyps. Some foods that are rich in calcium are milk, cheese, and broccoli. Some foods that are rich in folate are chickpeas, kidney beans, and spinach.   Hemorrhoids Hemorrhoids are dilated (enlarged) veins around the rectum. Sometimes clots will form in the veins. This makes them swollen and painful. These are called thrombosed hemorrhoids. Causes of hemorrhoids include:  Constipation.   Straining to have a bowel movement.   HEAVY LIFTING  HOME CARE INSTRUCTIONS  Eat a well balanced diet and drink 6 to 8 glasses of water every day to avoid constipation. You may also use a bulk laxative.   Avoid straining to have bowel movements.   Keep anal area  dry and clean.   Do not use a donut shaped pillow or sit on the toilet for long periods. This increases blood pooling and pain.   Move your bowels when your body has the urge; this will require less straining and will decrease pain and pressure.

## 2016-09-16 NOTE — Op Note (Signed)
[facility] Patient Name: Robert Mcintyre Procedure Date: 09/16/2016 12:40 PM MRN: [id number] Date of Birth: 1966/02/29 Attending MD: Barney Drain , MD CSN: [id number] Age: 50 Admit Type: Outpatient Procedure:                Colonoscopy WITH COLD FORCEPS/COLD SNARE/SNARE                            CAUTERY POLYPECTOMY Indications:              Screening for colorectal malignant neoplasm Providers:                Barney Drain, MD, Charlyne Petrin RN, RN, Isabella Stalling, Technician Referring MD:             Lysle Morales Medicines:                Meperidine 75 mg IV, Midazolam 5 mg IV Complications:            No immediate complications. Estimated Blood Loss:     Estimated blood loss was minimal                           . Procedure:                Pre-Anesthesia Assessment:                           - Prior to the procedure, a History and Physical                            was performed, and patient medications and                            allergies were reviewed. The patient's tolerance of                            previous anesthesia was also reviewed. The risks                            and benefits of the procedure and the sedation                            options and risks were discussed with the patient.                            All questions were answered, and informed consent                            was obtained. Prior Anticoagulants: The patient has                            taken naproxen. ASA Grade Assessment: II - A  patient with mild systemic disease. After reviewing                            the risks and benefits, the patient was deemed in                            satisfactory condition to undergo the procedure.                            After obtaining informed consent, the colonoscope                            was passed under direct vision. Throughout the                            procedure, the  patient's blood pressure, pulse, and                            oxygen saturations were monitored continuously. The                            Colonoscope was introduced through the anus and                            advanced to the the cecum, identified by                            appendiceal orifice and ileocecal valve. The                            ileocecal valve, appendiceal orifice, and rectum                            were photographed. The colonoscopy was somewhat                            difficult due to a tortuous colon. Successful                            completion of the procedure was aided by increasing                            the dose of sedation medication, straightening and                            shortening the scope to obtain bowel loop reduction                            and COLOWRAP. The patient tolerated the procedure                            fairly well. The quality of the bowel preparation  was excellent. Scope In: 1:18:37 PM Scope Out: 1:47:09 PM Scope Withdrawal Time: 0 hours 26 minutes 16 seconds  Total Procedure Duration: 0 hours 28 minutes 32 seconds  Findings:      Four sessile polyps were found in the rectum, descending colon, hepatic       flexure and ascending colon. The polyps were 3 to 5 mm in size. These       polyps were removed with a cold snare. Resection and retrieval were       complete. Bleeding after removal of hepatic flexure polyp and cautery       applied to base of site and hemostasis acheived.      Three sessile polyps were found in the descending colon and hepatic       flexure(2). The polyps were 5 to 7 mm in size. These polyps were removed       with a hot snare. Resection and retrieval were complete.      A 3 mm polyp was found in the sigmoid colon. The polyp was sessile. The       polyp was removed with a cold biopsy forceps. Resection and retrieval       were complete.      Internal  hemorrhoids were found. The hemorrhoids were moderate. Impression:               - EIGHT polyps removed                           - Internal hemorrhoids. Moderate Sedation:      Moderate (conscious) sedation was administered by the endoscopy nurse       and supervised by the endoscopist. The following parameters were       monitored: oxygen saturation, heart rate, blood pressure, and response       to care. Total physician intraservice time was 40 minutes. Recommendation:           - High fiber diet. LOSE WEIGHT.                           - Continue present medications.                           - Await pathology results.                           - Repeat colonoscopy in 3 years for surveillance                            WITH COLOWRAP.                           - Patient has a contact number available for                            emergencies. The signs and symptoms of potential                            delayed complications were discussed with the  patient. Return to normal activities tomorrow.                            Written discharge instructions were provided to the                            patient. Procedure Code(s):        --- Professional ---                           [phone], Colonoscopy, flexible; with removal of                            tumor(s), polyp(s), or other lesion(s) by snare                            technique                           45380, 59, Colonoscopy, flexible; with biopsy,                            single or multiple                           99152, Moderate sedation services provided by the                            same physician or other qualified health care                            professional performing the diagnostic or                            therapeutic service that the sedation supports,                            requiring the presence of an independent trained                            observer to  assist in the monitoring of the                            patient's level of consciousness and physiological                            status; initial 15 minutes of intraservice time,                            patient age 21 years or older                           [phone], Moderate sedation services; each additional                            15 minutes intraservice  time                           [phone], Moderate sedation services; each additional                            15 minutes intraservice time Diagnosis Code(s):        --- Professional ---                           Z12.11, Encounter for screening for malignant                            neoplasm of colon                           K62.1, Rectal polyp                           D12.2, Benign neoplasm of ascending colon                           D12.4, Benign neoplasm of descending colon                           D12.3, Benign neoplasm of transverse colon (hepatic                            flexure or splenic flexure)                           D12.5, Benign neoplasm of sigmoid colon                           K64.8, Other hemorrhoids CPT copyright 2016 [employer]. All rights reserved. The codes documented in this report are preliminary and upon coder review may  be revised to meet current compliance requirements. Barney Drain, MD Barney Drain, MD 09/16/2016 2:03:34 PM This report has been signed electronically. Number of Addenda: 0

## 2016-09-16 NOTE — H&P (Signed)
Primary Care Physician:  Raylene Everts, MD Primary Gastroenterologist:  Dr. Oneida Alar  Pre-Procedure History & Physical: HPI:  Robert Mcintyre is a 50 y.o. male here for [place].  Past Medical History:  Diagnosis Date  . Arthritis   . Asthma   . Hypertension     Past Surgical History:  Procedure Laterality Date  . SPINE SURGERY  05/1997   back fusion    Prior to Admission medications   Medication Sig Start Date End Date Taking? Authorizing Provider  naproxen sodium (ANAPROX) 220 MG tablet Take 220 mg by mouth 2 (two) times daily as needed.    Yes Historical Provider, MD    Allergies as of 08/15/2016  . (No Known Allergies)    Family History  Problem Relation Age of Onset  . Heart disease Mother   . Stroke Mother   . Hypertension Mother   . Asthma Father   . Hypertension Brother     Social History   Social History  . Marital status: Single    Spouse name: N/A  . Number of children: 0  . Years of education: 12   Occupational History  . truck driver    Social History Main Topics  . Smoking status: Never Smoker  . Smokeless tobacco: Never Used  . Alcohol use Yes     Comment: occasional  . Drug use: No  . Sexual activity: Not Currently   Other Topics Concern  . Not on file   Social History Narrative   Lives with brother    Review of Systems: See HPI, otherwise negative ROS   Physical Exam: BP (!) 151/93   Pulse 86   Temp 98.5 F (36.9 C) (Oral)   Resp 18   Ht 5\' 8"  (1.727 m)   Wt 300 lb (136.1 kg)   SpO2 100%   BMI 45.61 kg/m  General:   Alert,  pleasant and cooperative in NAD Head:  Normocephalic and atraumatic. Neck:  Supple; Lungs:  Clear throughout to auscultation.    Heart:  Regular rate and rhythm. Abdomen:  Soft, nontender and nondistended. Normal bowel sounds, without guarding, and without rebound.   Neurologic:  Alert and  oriented x4;  grossly normal neurologically.  Impression/Plan:      SCREENING  Plan:  1. TCS TODAY. DISCUSSED PROCEDURE, BENEFITS, & RISKS: < 1% chance of medication reaction, bleeding, perforation, or rupture of spleen/liver.

## 2016-09-20 ENCOUNTER — Encounter: Payer: Self-pay | Admitting: Family Medicine

## 2016-09-20 DIAGNOSIS — D126 Benign neoplasm of colon, unspecified: Secondary | ICD-10-CM | POA: Insufficient documentation

## 2016-09-21 ENCOUNTER — Encounter (HOSPITAL_COMMUNITY): Payer: Self-pay | Admitting: Gastroenterology

## 2016-09-22 ENCOUNTER — Telehealth: Payer: Self-pay | Admitting: Gastroenterology

## 2016-09-22 NOTE — Telephone Encounter (Signed)
ON RECALL  °

## 2016-09-22 NOTE — Telephone Encounter (Signed)
Please call pt. HE had MORE THAN THREE simple adenomas removed.   CONTINUE YOUR WEIGHT LOSS EFFORTS.   DRINK WATER TO KEEP YOUR URINE LIGHT YELLOW.  FOLLOW A HIGH FIBER DIET. AVOID ITEMS THAT CAUSE BLOATING & GAS.   Next colonoscopy in 3 years. YOUR SISTERS, BROTHERS, CHILDREN, AND PARENTS NEED TO HAVE A COLONOSCOPY STARTING AT THE AGE OF 40.

## 2016-09-22 NOTE — Telephone Encounter (Signed)
Pt is aware.  

## 2016-11-07 ENCOUNTER — Ambulatory Visit: Payer: 59 | Admitting: Family Medicine

## 2016-11-08 ENCOUNTER — Ambulatory Visit (INDEPENDENT_AMBULATORY_CARE_PROVIDER_SITE_OTHER): Payer: 59 | Admitting: Family Medicine

## 2016-11-08 ENCOUNTER — Encounter: Payer: Self-pay | Admitting: Family Medicine

## 2016-11-08 VITALS — BP 150/102 | HR 82 | Temp 98.9°F | Resp 18 | Ht 69.5 in | Wt 311.1 lb

## 2016-11-08 DIAGNOSIS — R030 Elevated blood-pressure reading, without diagnosis of hypertension: Secondary | ICD-10-CM

## 2016-11-08 NOTE — Progress Notes (Signed)
    Chief Complaint  Patient presents with  . 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 Tubular adenoma of colon 09/20/2016  . Morbid obesity ([place]) 07/18/2016  . Elevated blood pressure reading 07/18/2016  . 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 naproxen sodium (ANAPROX) 220 MG tablet Take 220 mg by mouth 2 (two) times daily as needed.    No facility-administered encounter medications on file as of 11/08/2016.     No Known Allergies  Review of Systems  Constitutional: Negative for activity change and appetite change.  HENT: Negative for congestion and dental problem.   Eyes: Negative for photophobia and visual disturbance.  Respiratory: Negative for cough and shortness of breath.   Cardiovascular: Negative for chest pain and palpitations.  Gastrointestinal: Negative for constipation and diarrhea.  Genitourinary: Negative for difficulty urinating and frequency.  Musculoskeletal: Negative for arthralgias and back pain.  Neurological: Negative for dizziness and headaches.  Hematological: Does not bruise/bleed easily.  Psychiatric/Behavioral: Positive for sleep disturbance. Negative for dysphoric mood.    BP (!) 150/102 (BP Location: Right Arm, Patient Position: Sitting, Cuff Size: Large)   Pulse 82   Temp 98.9 F (37.2 C) (Temporal)   Resp 18   Ht 5' 9.5" (1.765 m)   Wt (!) 311 lb 1.9 oz (141.1 kg)   SpO2 97%   BMI 45.29 kg/m   Physical Exam  Constitutional: He is oriented to person, place, and time. He appears well-developed and well-nourished.  obese  HENT:  Head: Normocephalic and atraumatic.  Right Ear: External ear normal.  Left Ear: External ear normal.    Mouth/Throat: Oropharynx is clear and moist.  Eyes: Conjunctivae are normal. Pupils are equal, round, and reactive to light.  Neck: Normal range of motion. Neck supple. No thyromegaly present.  Cardiovascular: Normal rate, regular rhythm and normal heart sounds.   Pulmonary/Chest: Effort normal and breath sounds normal. No respiratory distress.  Abdominal: Soft. Bowel sounds are normal.  Musculoskeletal: Normal range of motion. He exhibits edema.  trace  Lymphadenopathy:    He has no cervical adenopathy.  Neurological: He is alert and oriented to person, place, and time.  Gait normal  Skin: Skin is warm and dry.  Psychiatric: He has a normal mood and affect. His behavior is normal. Thought content normal.  Nursing note and vitals reviewed.   ASSESSMENT/PLAN:  1. Elevated blood pressure reading  - CBC - Comprehensive metabolic panel - Hemoglobin A1c - Lipid panel - VITAMIN D 25 Hydroxy (Vit-D Deficiency, Fractures) - Urinalysis, Routine w reflex microscopic  2. Morbid obesity ([place])  -   Patient Instructions  Need to try to lose weight Make better choices regarding fast food and portions Try to walk daily when you are able Need to check labs and blood pressure in 3 months Call sooner for problems    Raylene Everts, MD

## 2016-11-08 NOTE — Patient Instructions (Addendum)
Need to try to lose weight Make better choices regarding fast food and portions Try to walk daily when you are able Need to check labs and blood pressure in 3 months Call sooner for problems

## 2017-02-03 ENCOUNTER — Ambulatory Visit: Payer: 59 | Admitting: Family Medicine

## 2017-03-20 ENCOUNTER — Ambulatory Visit (INDEPENDENT_AMBULATORY_CARE_PROVIDER_SITE_OTHER): Payer: 59 | Admitting: Family Medicine

## 2017-03-20 ENCOUNTER — Ambulatory Visit (HOSPITAL_COMMUNITY)
Admission: RE | Admit: 2017-03-20 | Discharge: 2017-03-20 | Disposition: A | Discharge status: Home / Self Care | Payer: 59 | Source: Ambulatory Visit | Attending: Family Medicine | Admitting: Family Medicine

## 2017-03-20 ENCOUNTER — Encounter: Payer: Self-pay | Admitting: Family Medicine

## 2017-03-20 VITALS — BP 158/108 | HR 88 | Temp 98.1°F | Resp 20 | Ht 70.0 in | Wt 323.0 lb

## 2017-03-20 DIAGNOSIS — I10 Essential (primary) hypertension: Secondary | ICD-10-CM | POA: Diagnosis not present

## 2017-03-20 DIAGNOSIS — M47816 Spondylosis without myelopathy or radiculopathy, lumbar region: Secondary | ICD-10-CM | POA: Diagnosis not present

## 2017-03-20 DIAGNOSIS — Z981 Arthrodesis status: Secondary | ICD-10-CM | POA: Insufficient documentation

## 2017-03-20 DIAGNOSIS — R030 Elevated blood-pressure reading, without diagnosis of hypertension: Secondary | ICD-10-CM

## 2017-03-20 DIAGNOSIS — M545 Low back pain: Secondary | ICD-10-CM | POA: Diagnosis present

## 2017-03-20 MED ORDER — AMLODIPINE BESYLATE 5 MG PO TABS: 5.0000 mg | ORAL_TABLET | Freq: Every day | ORAL | 3 refills | Status: DC

## 2017-03-20 MED ORDER — MELOXICAM 15 MG PO TABS: 15.0000 mg | ORAL_TABLET | Freq: Every day | ORAL | 0 refills | Status: DC

## 2017-03-20 MED ORDER — GABAPENTIN 300 MG PO CAPS: 300.0000 mg | ORAL_CAPSULE | Freq: Every day | ORAL | 0 refills | Status: DC

## 2017-03-20 NOTE — Patient Instructions (Signed)
Activity as tolerated Walk every day that you are able  Take the mobic daily This is an anti inflammatory pain medicine Take with food Do not take advil or aleve while on the mobic  Take the gabapentin at night an hour before bed If it does not cause drowsiness, take twice a day This is for nerve pain  Take the amlodipine once a day This is for blood pressure  Get the back x ray today I have ordered PT Let me know when you need time off

## 2017-03-20 NOTE — Progress Notes (Signed)
Chief Complaint  Patient presents with  . Follow-up   Patient is here for routine follow-up. He is complaining of low back pain today. He has a history of a lumbar fusion 20 years ago. For the last month and a half he's had increased back pain. He has a "stinging" sensation around his incision. Pain with movement. Pain into both legs. He has continued working. He takes occasional Aleve. He had no injury or accident, but thinks this may have followed a twisting movement at work. He states usually his back pain is about a "4", now it is going up to about a "9". He has difficulty sleeping at night. No bowel or bladder complaint. He's had a history of elevated blood pressure. 4 out of his 5 last blood pressures have been elevated. He has been resistant to the diagnosis of hypertension. Today explained to him he has hypertension, and needs medication. I'm starting him on amlodipine 5 mg daily. He will follow up in 2 weeks for blood pressure check. Patient has morbid obesity. He's gained weight since his last visit. I discussed with him the importance of diet and exercise to try to reduce his weight and better control his blood pressure. He has not been exercising because of his back pain.  Patient Active Problem List   Diagnosis Date Noted  . Tubular adenoma of colon 09/20/2016  . Morbid obesity ([place]) 07/18/2016  . Elevated blood pressure reading 07/18/2016  . 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    Outpatient Encounter Prescriptions as of 03/20/2017  Medication Sig  . naproxen sodium (ANAPROX) 220 MG tablet Take 220 mg by mouth 2 (two) times daily as needed.   Marland Kitchen amLODipine (NORVASC) 5 MG tablet Take 1 tablet (5 mg total) by mouth daily.  Marland Kitchen gabapentin (NEURONTIN) 300 MG capsule Take 1 capsule (300 mg total) by mouth at bedtime.  . meloxicam (MOBIC) 15 MG tablet Take 1 tablet (15 mg total) by mouth daily.   No facility-administered encounter medications on file as of  03/20/2017.     No Known Allergies  Review of Systems  Constitutional: Positive for activity change and unexpected weight change.  HENT: Negative.  Negative for congestion and dental problem.   Eyes: Negative.  Negative for visual disturbance.  Respiratory: Negative.  Negative for cough and shortness of breath.   Cardiovascular: Negative.  Negative for chest pain and palpitations.  Gastrointestinal: Negative for constipation and diarrhea.  Genitourinary: Negative for difficulty urinating and frequency.  Musculoskeletal: Positive for arthralgias and back pain.    BP (!) 158/108 (BP Location: Right Arm, Patient Position: Sitting, Cuff Size: Large)   Pulse 88   Temp 98.1 F (36.7 C) (Temporal)   Resp 20   Ht 5\' 10"  (1.778 m)   Wt (!) 323 lb 0.6 oz (146.5 kg)   SpO2 97%   BMI 46.35 kg/m   Physical Exam  Constitutional: He is oriented to person, place, and time. He appears well-developed and well-nourished.  obese  HENT:  Head: Normocephalic and atraumatic.  Right Ear: External ear normal.  Left Ear: External ear normal.  Mouth/Throat: Oropharynx is clear and moist.  Eyes: Conjunctivae are normal. Pupils are equal, round, and reactive to light.  Neck: Normal range of motion. Neck supple. No thyromegaly present.  Cardiovascular: Normal rate, regular rhythm and normal heart sounds.   Pulmonary/Chest: Effort normal and breath sounds normal. No respiratory distress.  Abdominal: Soft. Bowel sounds are normal.  Musculoskeletal: Normal range  of motion. He exhibits edema.  trace edema. Back is straight and symmetric. Well healed lumbar scar. Tenderness diffusely throughout the lumbar spine, lumbar muscles, and posterior pelvis. No muscle spasm. Range of motion is intact. Strength sensation reflexes normal in both lower extremities. Straight leg raise does not elicit radicular pain  Lymphadenopathy:    He has no cervical adenopathy.  Neurological: He is alert and oriented to person,  place, and time.  Gait normal  Skin: Skin is warm and dry.  Psychiatric: He has a normal mood and affect. His behavior is normal. Thought content normal.  Nursing note and vitals reviewed.   ASSESSMENT/PLAN:  1. Spondylosis of lumbar region without myelopathy or radiculopathy Likely mechanical back pain. - Ambulatory referral to Physical Therapy - DG Lumbar Spine Complete; Future  2. Elevated blood pressure reading Diagnosed with essential hypertension. To start amlodipine daily. 3. Morbid obesity Discussed the importance of diet and exercise.  Patient Instructions  Activity as tolerated Walk every day that you are able  Take the mobic daily This is an anti inflammatory pain medicine Take with food Do not take advil or aleve while on the mobic  Take the gabapentin at night an hour before bed If it does not cause drowsiness, take twice a day This is for nerve pain  Take the amlodipine once a day This is for blood pressure  Get the back x ray today I have ordered PT Let me know when you need time off     Raylene Everts, MD

## 2017-03-22 ENCOUNTER — Telehealth: Payer: Self-pay

## 2017-03-22 NOTE — Telephone Encounter (Signed)
-----   Message from Raylene Everts, MD sent at 03/20/2017 10:41 AM EDT ----- Back x ray looks pretty good.  Hopeful that he will see improvement with conservative treatment.

## 2017-03-22 NOTE — Telephone Encounter (Signed)
Called Robert Mcintyre, aware of x ray results.

## 2017-06-16 ENCOUNTER — Ambulatory Visit: Payer: 59 | Admitting: Family Medicine

## 2017-07-12 ENCOUNTER — Ambulatory Visit: Payer: 59 | Admitting: Family Medicine

## 2017-07-17 ENCOUNTER — Ambulatory Visit: Payer: 59 | Admitting: Family Medicine

## 2017-07-24 ENCOUNTER — Telehealth: Payer: Self-pay | Admitting: Family Medicine

## 2017-07-24 NOTE — Telephone Encounter (Signed)
Moved patient up on the schedule do to scheduling conflict Left message.

## 2017-07-28 ENCOUNTER — Telehealth: Payer: Self-pay | Admitting: *Deleted

## 2017-07-28 ENCOUNTER — Ambulatory Visit: Payer: 59 | Admitting: Family Medicine

## 2017-07-28 ENCOUNTER — Encounter: Payer: Self-pay | Admitting: Family Medicine

## 2017-07-28 ENCOUNTER — Ambulatory Visit (INDEPENDENT_AMBULATORY_CARE_PROVIDER_SITE_OTHER): Payer: BLUE CROSS/BLUE SHIELD | Admitting: Family Medicine

## 2017-07-28 VITALS — BP 142/90 | HR 106 | Resp 16 | Ht 70.0 in | Wt 324.4 lb

## 2017-07-28 DIAGNOSIS — G8929 Other chronic pain: Secondary | ICD-10-CM

## 2017-07-28 DIAGNOSIS — J208 Acute bronchitis due to other specified organisms: Secondary | ICD-10-CM

## 2017-07-28 DIAGNOSIS — M4726 Other spondylosis with radiculopathy, lumbar region: Secondary | ICD-10-CM | POA: Diagnosis not present

## 2017-07-28 DIAGNOSIS — Z23 Encounter for immunization: Secondary | ICD-10-CM | POA: Diagnosis not present

## 2017-07-28 DIAGNOSIS — M545 Low back pain, unspecified: Secondary | ICD-10-CM

## 2017-07-28 DIAGNOSIS — I10 Essential (primary) hypertension: Secondary | ICD-10-CM | POA: Diagnosis not present

## 2017-07-28 MED ORDER — TRAMADOL HCL 50 MG PO TABS: 100.0000 mg | ORAL_TABLET | Freq: Four times a day (QID) | ORAL | 0 refills | Status: DC | PRN

## 2017-07-28 MED ORDER — METHOCARBAMOL 750 MG PO TABS: 750.0000 mg | ORAL_TABLET | Freq: Four times a day (QID) | ORAL | 0 refills | Status: DC | PRN

## 2017-07-28 MED ORDER — BENZONATATE 200 MG PO CAPS: 200.0000 mg | ORAL_CAPSULE | Freq: Two times a day (BID) | ORAL | 0 refills | Status: DC | PRN

## 2017-07-28 NOTE — Telephone Encounter (Signed)
When does this patient need to follow up?

## 2017-07-28 NOTE — Progress Notes (Signed)
Chief Complaint  Patient presents with  . Cough    chest congestion non productive, SOB, rib pain x 3 days   . Back Pain    radiating lower back pain into legs. rates pain 7/10   Here with 2 separate medical problems. First he states that he has been having a cough for 5 days.  He has some clear runny nose, some stuffy nose.  No sore throat.  Yesterday headaches.  No fever sweats or chills.  He does feel very tired.  His chest feels congested but is not coughing up any sputum.  He has tried to work in spite of this illness but today feels quite exhausted.  No underlying COPD, cigarette smoking, asthma or known allergies Patient also states he has chronic low back pain.  This is become more painful, and more limiting gradually for the last month or so.  He has pain in his low back every day.  When he exerts himself the pain radiates into his both legs, left greater than right.  Sometimes his legs will feel numb or weak.  He states that he has difficulty with performing his job when his back is bothering him.  He is taking naproxen for pain.  Previously was on Mobic.  He does not feel like either 1 of these  helps very much.  He did have back surgery about 20 years ago.  This feels lower than his prior back problem, with a little less leg problem.  I discussed with him that this is been going on for many months.  It is worse.  I think that he needs to see a spine specialist.  He likely needs additional imaging.  I am going to provide him with Robaxin as needed and tramadol as needed, and a few days off of work.  I am going to place a referral to [facility].  Activity limits are discussed with him.  Patient Active Problem List   Diagnosis Date Noted  . Essential hypertension 03/20/2017  . Tubular adenoma of colon 09/20/2016  . Morbid obesity ([place]) 07/18/2016  . 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 meloxicam (MOBIC) 15 MG tablet Take 1 tablet (15 mg total) by mouth daily.  . naproxen sodium (ANAPROX) 220 MG tablet Take 220 mg by mouth 2 (two) times daily as needed.   Marland Kitchen amLODipine (NORVASC) 5 MG tablet Take 1 tablet (5 mg total) by mouth daily.  . benzonatate (TESSALON) 200 MG capsule Take 1 capsule (200 mg total) by mouth 2 (two) times daily as needed for cough.  . gabapentin (NEURONTIN) 300 MG capsule Take 1 capsule (300 mg total) by mouth at bedtime. (Patient not taking: Reported on 07/28/2017)  . methocarbamol (ROBAXIN-750) 750 MG tablet Take 1 tablet (750 mg total) by mouth every 6 (six) hours as needed for muscle spasms.  . traMADol (ULTRAM) 50 MG tablet Take 2 tablets (100 mg total) by mouth every 6 (six) hours as needed.   No facility-administered encounter medications on file as of 07/28/2017.     No Known Allergies  Review of Systems  Constitutional: Positive for fatigue. Negative for activity change, appetite change, chills, fever and unexpected weight change.  HENT: Positive for congestion and rhinorrhea. Negative for postnasal drip, sinus pain and sore throat.   Eyes: Negative for redness and visual disturbance.  Respiratory: Positive for cough. Negative for shortness  of breath.   Cardiovascular: Negative for chest pain, palpitations and leg swelling.  Gastrointestinal: Negative for diarrhea, nausea and vomiting.  Musculoskeletal: Positive for back pain and gait problem. Negative for neck pain and neck stiffness.  Skin: Negative for rash.  Neurological: Positive for weakness and headaches. Negative for dizziness.  Psychiatric/Behavioral: Positive for sleep disturbance. Negative for dysphoric mood. The patient is not nervous/anxious.     BP (!) 142/90   Pulse (!) 106   Resp 16   Ht 5\' 10"  (1.778 m)   Wt (!) 324 lb 6.4 oz (147.1 kg)   SpO2 95%   BMI 46.55 kg/m   Physical Exam  Constitutional: He is oriented to person, place, and time. He appears  well-developed and well-nourished.  Obese.  Acutely ill, holding tissue to mouth.  Frequent coughing.  HENT:  Head: Normocephalic and atraumatic.  Right Ear: External ear normal.  Left Ear: External ear normal.  Mouth/Throat: Oropharynx is clear and moist.  Clear rhinorrhea  Eyes: Pupils are equal, round, and reactive to light. Conjunctivae are normal.  Neck: Normal range of motion. Neck supple. No thyromegaly present.  Cardiovascular: Normal rate, regular rhythm and normal heart sounds.   Pulmonary/Chest: Effort normal and breath sounds normal. No respiratory distress.  Few anterior rhonchi  Abdominal: Soft. Bowel sounds are normal.  Musculoskeletal: Normal range of motion. He exhibits edema.  trace edema. Back is straight and symmetric. Well healed lumbar scar. Tenderness diffusely throughout the lumbar spine, lumbar muscles, and posterior pelvis. No muscle spasm. Range of motion is intact. Strength sensation reflexes normal in both lower extremities. Straight leg raise does not elicit radicular pain  Lymphadenopathy:    He has no cervical adenopathy.  Neurological: He is alert and oriented to person, place, and time.  Gait stooped, mildly antalgic  Skin: Skin is warm and dry.  Psychiatric: He has a normal mood and affect. His behavior is normal. Thought content normal.  Nursing note and vitals reviewed.   ASSESSMENT/PLAN:  1. Need for immunization against influenza - Flu Vaccine QUAD 36+ mos IM  2. Essential hypertension Not well controlled, although patient is sick in sleep deprived  3. Viral bronchitis Discussed need to avoid antibiotics.  Symptomatic care  4. Osteoarthritis of spine with radiculopathy, lumbar region  5. Chronic lumbosacral pain With pattern of worsening pain and radiculopathy.  Recommend referral to back specialty.   Patient Instructions  Rest  Push fluids Take the cough medicine as needed  I have prescribed robaxin as a muscle relaxer I have  prescribed tramadol for pain These are to be used as needed for low back pain Use ice or heat Gentle stretching I will refer to [facility]  Off work next week      Raylene Everts, MD

## 2017-07-28 NOTE — Telephone Encounter (Signed)
Every 3 mo

## 2017-07-28 NOTE — Patient Instructions (Signed)
Rest  Push fluids Take the cough medicine as needed  I have prescribed robaxin as a muscle relaxer I have prescribed tramadol for pain These are to be used as needed for low back pain Use ice or heat Gentle stretching I will refer to [facility]  Off work next week

## 2017-07-31 NOTE — Telephone Encounter (Signed)
Patient is scheduled for 10/30/17 at 8:20. patient aware.

## 2017-10-09 DIAGNOSIS — M545 Low back pain: Secondary | ICD-10-CM | POA: Diagnosis not present

## 2017-10-09 DIAGNOSIS — Z981 Arthrodesis status: Secondary | ICD-10-CM | POA: Diagnosis not present

## 2017-10-09 DIAGNOSIS — Z6841 Body Mass Index (BMI) 40.0 and over, adult: Secondary | ICD-10-CM | POA: Diagnosis not present

## 2017-10-09 DIAGNOSIS — I10 Essential (primary) hypertension: Secondary | ICD-10-CM | POA: Diagnosis not present

## 2017-10-13 ENCOUNTER — Ambulatory Visit: Payer: BLUE CROSS/BLUE SHIELD | Admitting: Urology

## 2017-10-13 DIAGNOSIS — C61 Malignant neoplasm of prostate: Secondary | ICD-10-CM | POA: Diagnosis not present

## 2017-10-13 DIAGNOSIS — R350 Frequency of micturition: Secondary | ICD-10-CM

## 2017-10-13 DIAGNOSIS — N5201 Erectile dysfunction due to arterial insufficiency: Secondary | ICD-10-CM

## 2017-10-13 DIAGNOSIS — R3912 Poor urinary stream: Secondary | ICD-10-CM | POA: Diagnosis not present

## 2017-10-13 DIAGNOSIS — R972 Elevated prostate specific antigen [PSA]: Secondary | ICD-10-CM

## 2017-10-13 DIAGNOSIS — N401 Enlarged prostate with lower urinary tract symptoms: Secondary | ICD-10-CM

## 2017-10-13 DIAGNOSIS — N471 Phimosis: Secondary | ICD-10-CM | POA: Diagnosis not present

## 2017-10-19 ENCOUNTER — Other Ambulatory Visit (HOSPITAL_COMMUNITY): Payer: Self-pay | Admitting: Neurosurgery

## 2017-10-19 DIAGNOSIS — M5440 Lumbago with sciatica, unspecified side: Secondary | ICD-10-CM

## 2017-10-30 ENCOUNTER — Ambulatory Visit
Admission: RE | Admit: 2017-10-30 | Discharge: 2017-10-30 | Disposition: A | Discharge status: Home / Self Care | Payer: BLUE CROSS/BLUE SHIELD | Source: Ambulatory Visit | Attending: Neurosurgery | Admitting: Neurosurgery

## 2017-10-30 ENCOUNTER — Ambulatory Visit: Payer: BLUE CROSS/BLUE SHIELD | Admitting: Family Medicine

## 2017-10-30 DIAGNOSIS — M5126 Other intervertebral disc displacement, lumbar region: Secondary | ICD-10-CM | POA: Diagnosis not present

## 2017-10-30 DIAGNOSIS — M5440 Lumbago with sciatica, unspecified side: Secondary | ICD-10-CM

## 2017-10-30 MED ORDER — GADOBENATE DIMEGLUMINE 529 MG/ML IV SOLN
20.0000 mL | Freq: Once | INTRAVENOUS | Status: AC | PRN
  Administered 2017-10-30: 20 mL via INTRAVENOUS

## 2017-11-03 ENCOUNTER — Other Ambulatory Visit: Payer: BLUE CROSS/BLUE SHIELD

## 2017-11-06 DIAGNOSIS — Z024 Encounter for examination for driving license: Secondary | ICD-10-CM | POA: Diagnosis not present

## 2017-11-14 DIAGNOSIS — M545 Low back pain: Secondary | ICD-10-CM | POA: Diagnosis not present

## 2017-11-14 DIAGNOSIS — I10 Essential (primary) hypertension: Secondary | ICD-10-CM | POA: Diagnosis not present

## 2017-11-14 DIAGNOSIS — Z981 Arthrodesis status: Secondary | ICD-10-CM | POA: Diagnosis not present

## 2017-11-14 DIAGNOSIS — Z6841 Body Mass Index (BMI) 40.0 and over, adult: Secondary | ICD-10-CM | POA: Diagnosis not present

## 2017-11-28 DIAGNOSIS — G8929 Other chronic pain: Secondary | ICD-10-CM | POA: Diagnosis not present

## 2017-11-28 DIAGNOSIS — R200 Anesthesia of skin: Secondary | ICD-10-CM | POA: Diagnosis not present

## 2017-11-28 DIAGNOSIS — M5440 Lumbago with sciatica, unspecified side: Secondary | ICD-10-CM | POA: Diagnosis not present

## 2017-11-30 DIAGNOSIS — R200 Anesthesia of skin: Secondary | ICD-10-CM | POA: Diagnosis not present

## 2017-12-01 ENCOUNTER — Telehealth: Payer: Self-pay | Admitting: Family Medicine

## 2017-12-01 DIAGNOSIS — M79606 Pain in leg, unspecified: Secondary | ICD-10-CM

## 2017-12-01 NOTE — Telephone Encounter (Signed)
Neuro referral placed per Dr. Meda Coffee

## 2017-12-04 ENCOUNTER — Encounter: Payer: Self-pay | Admitting: Family Medicine

## 2017-12-15 ENCOUNTER — Other Ambulatory Visit: Payer: Self-pay | Admitting: Urology

## 2017-12-15 ENCOUNTER — Ambulatory Visit (HOSPITAL_COMMUNITY)
Admission: RE | Admit: 2017-12-15 | Discharge: 2017-12-15 | Disposition: A | Discharge status: Home / Self Care | Payer: BLUE CROSS/BLUE SHIELD | Source: Ambulatory Visit | Attending: Urology | Admitting: Urology

## 2017-12-15 DIAGNOSIS — C61 Malignant neoplasm of prostate: Secondary | ICD-10-CM | POA: Diagnosis not present

## 2017-12-15 DIAGNOSIS — R972 Elevated prostate specific antigen [PSA]: Secondary | ICD-10-CM | POA: Diagnosis not present

## 2017-12-15 MED ORDER — LIDOCAINE HCL (PF) 2 % IJ SOLN
10.0000 mL | Freq: Once | INTRAMUSCULAR | Status: AC
  Administered 2017-12-15: 10 mL

## 2017-12-15 MED ORDER — LIDOCAINE HCL (PF) 1 % IJ SOLN
INTRAMUSCULAR | Status: AC
  Administered 2017-12-15: 2.1 mL
  Filled 2017-12-15: qty 5

## 2017-12-15 MED ORDER — CEFTRIAXONE SODIUM 1 G IJ SOLR
1.0000 g | Freq: Once | INTRAMUSCULAR | Status: AC
  Administered 2017-12-15: 1 g via INTRAMUSCULAR

## 2017-12-15 MED ORDER — CEFTRIAXONE SODIUM 1 G IJ SOLR
INTRAMUSCULAR | Status: AC
Start: 2017-12-15 — End: 2017-12-15
  Administered 2017-12-15: 1 g via INTRAMUSCULAR
  Filled 2017-12-15: qty 10

## 2017-12-15 MED ORDER — LIDOCAINE HCL (PF) 2 % IJ SOLN
INTRAMUSCULAR | Status: AC
  Administered 2017-12-15: 10 mL
  Filled 2017-12-15: qty 10

## 2017-12-15 NOTE — Discharge Instructions (Signed)
Transrectal Ultrasound-Guided Biopsy A transrectal ultrasound-guided biopsy is a procedure to take samples of tissue from your prostate. Ultrasound images are used to guide the procedure. It is usually done to check the prostate gland for cancer. What happens before the procedure?  Do not eat or drink after midnight on the night before your procedure.  Take medicines as your doctor tells you.  Your doctor may have you stop taking some medicines 5-7 days before the procedure.  You will be given an enema before your procedure. During an enema, a liquid is put into your butt (rectum) to clear out waste.  You may have lab tests the day of your procedure.  Make plans to have someone drive you home. What happens during the procedure?  You will be given medicine to help you relax before the procedure. An IV tube will be put into one of your veins. It will be used to give fluids and medicine.  You will be given medicine to reduce the risk of infection (antibiotic).  You will be placed on your side.  A probe with gel will be put in your butt. This is used to take pictures of your prostate and the area around it.  A medicine to numb the area is put into your prostate.  A biopsy needle is then inserted and guided to your prostate.  Samples of prostate tissue are taken. The needle is removed.  The samples are sent to a lab to be checked. Results are usually back in 2-3 days. What happens after the procedure?  You will be taken to a room where you will be watched until you are doing okay.  You may have some pain in the area around your butt. You will be given medicines for this.  You may be able to go home the same day. Sometimes, an overnight stay in the hospital is needed. This information is not intended to replace advice given to you by your health care provider. Make sure you discuss any questions you have with your health care provider. Document Released: 08/31/2009 Document Revised:  02/18/2016 Document Reviewed: 05/01/2013 Elsevier Interactive Patient Education  2018 [employer]  

## 2017-12-15 NOTE — Consent Form (Signed)
CLINICAL DATA:    Ultrasound was provided for use by the ordering physician, and a technical  charge was applied by the performing facility.  No radiologist  interpretation/professional services rendered.   

## 2017-12-20 ENCOUNTER — Other Ambulatory Visit: Payer: Self-pay | Admitting: Urology

## 2017-12-21 ENCOUNTER — Other Ambulatory Visit: Payer: Self-pay | Admitting: Urology

## 2017-12-21 DIAGNOSIS — R972 Elevated prostate specific antigen [PSA]: Secondary | ICD-10-CM

## 2017-12-21 DIAGNOSIS — C61 Malignant neoplasm of prostate: Secondary | ICD-10-CM

## 2017-12-22 ENCOUNTER — Ambulatory Visit (HOSPITAL_COMMUNITY)
Admission: RE | Admit: 2017-12-22 | Discharge: 2017-12-22 | Disposition: A | Discharge status: Home / Self Care | Payer: BLUE CROSS/BLUE SHIELD | Source: Ambulatory Visit | Attending: Urology | Admitting: Urology

## 2017-12-22 ENCOUNTER — Other Ambulatory Visit: Payer: Self-pay | Admitting: Urology

## 2017-12-22 ENCOUNTER — Encounter (HOSPITAL_COMMUNITY): Payer: Self-pay

## 2017-12-22 DIAGNOSIS — R972 Elevated prostate specific antigen [PSA]: Secondary | ICD-10-CM | POA: Insufficient documentation

## 2017-12-22 DIAGNOSIS — M129 Arthropathy, unspecified: Secondary | ICD-10-CM | POA: Diagnosis not present

## 2017-12-22 DIAGNOSIS — C61 Malignant neoplasm of prostate: Secondary | ICD-10-CM

## 2017-12-22 MED ORDER — TECHNETIUM TC 99M MEDRONATE IV KIT: 20.0000 | PACK | Freq: Once | INTRAVENOUS | Status: DC | PRN

## 2018-01-12 ENCOUNTER — Ambulatory Visit (HOSPITAL_COMMUNITY)
Admission: RE | Admit: 2018-01-12 | Discharge: 2018-01-12 | Disposition: A | Discharge status: Home / Self Care | Payer: BLUE CROSS/BLUE SHIELD | Source: Ambulatory Visit | Attending: Urology | Admitting: Urology

## 2018-01-12 ENCOUNTER — Encounter (HOSPITAL_COMMUNITY): Payer: Self-pay

## 2018-01-12 DIAGNOSIS — R972 Elevated prostate specific antigen [PSA]: Secondary | ICD-10-CM | POA: Insufficient documentation

## 2018-01-12 DIAGNOSIS — N400 Enlarged prostate without lower urinary tract symptoms: Secondary | ICD-10-CM | POA: Diagnosis not present

## 2018-01-12 DIAGNOSIS — K429 Umbilical hernia without obstruction or gangrene: Secondary | ICD-10-CM | POA: Insufficient documentation

## 2018-01-12 DIAGNOSIS — C61 Malignant neoplasm of prostate: Secondary | ICD-10-CM | POA: Diagnosis not present

## 2018-01-12 MED ORDER — IOPAMIDOL (ISOVUE-300) INJECTION 61%
100.0000 mL | Freq: Once | INTRAVENOUS | Status: AC | PRN
  Administered 2018-01-12: 100 mL via INTRAVENOUS

## 2018-02-16 ENCOUNTER — Ambulatory Visit: Payer: BLUE CROSS/BLUE SHIELD | Admitting: Urology

## 2018-02-16 DIAGNOSIS — R972 Elevated prostate specific antigen [PSA]: Secondary | ICD-10-CM

## 2018-02-16 DIAGNOSIS — C775 Secondary and unspecified malignant neoplasm of intrapelvic lymph nodes: Secondary | ICD-10-CM

## 2018-02-16 DIAGNOSIS — C61 Malignant neoplasm of prostate: Secondary | ICD-10-CM | POA: Diagnosis not present

## 2018-02-21 ENCOUNTER — Other Ambulatory Visit: Payer: Self-pay | Admitting: Urology

## 2018-02-21 DIAGNOSIS — C61 Malignant neoplasm of prostate: Secondary | ICD-10-CM

## 2018-02-23 ENCOUNTER — Ambulatory Visit (HOSPITAL_COMMUNITY)
Admission: RE | Admit: 2018-02-23 | Discharge: 2018-02-23 | Disposition: A | Discharge status: Home / Self Care | Payer: BLUE CROSS/BLUE SHIELD | Source: Ambulatory Visit | Attending: Urology | Admitting: Urology

## 2018-02-23 DIAGNOSIS — C61 Malignant neoplasm of prostate: Secondary | ICD-10-CM

## 2018-02-23 MED ORDER — AXUMIN (FLUCICLOVINE F 18) INJECTION
11.0000 | Freq: Once | INTRAVENOUS | Status: AC
  Administered 2018-02-23: 11 via INTRAVENOUS

## 2018-02-26 ENCOUNTER — Other Ambulatory Visit (HOSPITAL_COMMUNITY): Payer: BLUE CROSS/BLUE SHIELD

## 2018-02-27 DIAGNOSIS — C61 Malignant neoplasm of prostate: Secondary | ICD-10-CM | POA: Diagnosis not present

## 2018-02-28 ENCOUNTER — Ambulatory Visit: Payer: Self-pay | Admitting: Diagnostic Neuroimaging

## 2018-03-05 ENCOUNTER — Other Ambulatory Visit: Payer: Self-pay | Admitting: Urology

## 2018-03-12 DIAGNOSIS — M6281 Muscle weakness (generalized): Secondary | ICD-10-CM | POA: Diagnosis not present

## 2018-03-12 DIAGNOSIS — C61 Malignant neoplasm of prostate: Secondary | ICD-10-CM | POA: Diagnosis not present

## 2018-03-26 DIAGNOSIS — M6281 Muscle weakness (generalized): Secondary | ICD-10-CM | POA: Diagnosis not present

## 2018-03-26 DIAGNOSIS — C61 Malignant neoplasm of prostate: Secondary | ICD-10-CM | POA: Diagnosis not present

## 2018-04-05 ENCOUNTER — Encounter (HOSPITAL_COMMUNITY): Payer: Self-pay

## 2018-04-05 NOTE — Patient Instructions (Signed)
Your procedure is scheduled on: Monday, April 09, 2018   Surgery Time:  7:15AM-10:45AM   Report to [facility] Main  Entrance    Report to admitting at 5:30 AM   Call this number if you have problems the morning of surgery [phone]   Do not eat food or drink liquids :After Midnight.   Do NOT smoke after Midnight   Take these medicines the morning of surgery with A SIP OF WATER: Amlodipine, Tamsulosin                               You may not have any metal on your body including jewelry, and body piercings             Do not wear lotions, powders, perfumes/cologne, or deodorant                           Men may shave face and neck.   Do not bring valuables to the hospital. [place].   Contacts, dentures or bridgework may not be worn into surgery.   Leave suitcase in the car. After surgery it may be brought to your room.   Special Instructions: Bring a copy of your healthcare power of attorney and living will documents         the day of surgery if you haven't scanned them in before.              Please read over the following fact sheets you were given:  [facility] - Preparing for Surgery Before surgery, you can play an important role.  Because skin is not sterile, your skin needs to be as free of germs as possible.  You can reduce the number of germs on your skin by washing with CHG (chlorahexidine gluconate) soap before surgery.  CHG is an antiseptic cleaner which kills germs and bonds with the skin to continue killing germs even after washing. Please DO NOT use if you have an allergy to CHG or antibacterial soaps.  If your skin becomes reddened/irritated stop using the CHG and inform your nurse when you arrive at Short Stay. Do not shave (including legs and underarms) for at least 48 hours prior to the first CHG shower.  You may shave your face/neck.  Please follow these instructions carefully:  1.  Shower  with CHG Soap the night before surgery and the  morning of surgery.  2.  If you choose to wash your hair, wash your hair first as usual with your normal  shampoo.  3.  After you shampoo, rinse your hair and body thoroughly to remove the shampoo.                             4.  Use CHG as you would any other liquid soap.  You can apply chg directly to the skin and wash.  Gently with a scrungie or clean washcloth.  5.  Apply the CHG Soap to your body ONLY FROM THE NECK DOWN.   Do   not use on face/ open                           Wound or  open sores. Avoid contact with eyes, ears mouth and   genitals (private parts).                       Wash face,  Genitals (private parts) with your normal soap.             6.  Wash thoroughly, paying special attention to the area where your    surgery  will be performed.  7.  Thoroughly rinse your body with warm water from the neck down.  8.  DO NOT shower/wash with your normal soap after using and rinsing off the CHG Soap.                9.  Pat yourself dry with a clean towel.            10.  Wear clean pajamas.            11.  Place clean sheets on your bed the night of your first shower and do not  sleep with pets. Day of Surgery : Do not apply any lotions/deodorants the morning of surgery.  Please wear clean clothes to the hospital/surgery center.  FAILURE TO FOLLOW THESE INSTRUCTIONS MAY RESULT IN THE CANCELLATION OF YOUR SURGERY  PATIENT SIGNATURE_________________________________  NURSE SIGNATURE__________________________________  ________________________________________________________________________   Adam Phenix  An incentive spirometer is a tool that can help keep your lungs clear and active. This tool measures how well you are filling your lungs with each breath. Taking long deep breaths may help reverse or decrease the chance of developing breathing (pulmonary) problems (especially infection) following:  A long period of time when  you are unable to move or be active. BEFORE THE PROCEDURE   If the spirometer includes an indicator to show your best effort, your nurse or respiratory therapist will set it to a desired goal.  If possible, sit up straight or lean slightly forward. Try not to slouch.  Hold the incentive spirometer in an upright position. INSTRUCTIONS FOR USE  1. Sit on the edge of your bed if possible, or sit up as far as you can in bed or on a chair. 2. Hold the incentive spirometer in an upright position. 3. Breathe out normally. 4. Place the mouthpiece in your mouth and seal your lips tightly around it. 5. Breathe in slowly and as deeply as possible, raising the piston or the ball toward the top of the column. 6. Hold your breath for 3-5 seconds or for as long as possible. Allow the piston or ball to fall to the bottom of the column. 7. Remove the mouthpiece from your mouth and breathe out normally. 8. Rest for a few seconds and repeat Steps 1 through 7 at least 10 times every 1-2 hours when you are awake. Take your time and take a few normal breaths between deep breaths. 9. The spirometer may include an indicator to show your best effort. Use the indicator as a goal to work toward during each repetition. 10. After each set of 10 deep breaths, practice coughing to be sure your lungs are clear. If you have an incision (the cut made at the time of surgery), support your incision when coughing by placing a pillow or rolled up towels firmly against it. Once you are able to get out of bed, walk around indoors and cough well. You may stop using the incentive spirometer when instructed by your caregiver.  RISKS AND COMPLICATIONS  Take your time so you  do not get dizzy or light-headed.  If you are in pain, you may need to take or ask for pain medication before doing incentive spirometry. It is harder to take a deep breath if you are having pain. AFTER USE  Rest and breathe slowly and easily.  It can be  helpful to keep track of a log of your progress. Your caregiver can provide you with a simple table to help with this. If you are using the spirometer at home, follow these instructions: [place] IF:   You are having difficultly using the spirometer.  You have trouble using the spirometer as often as instructed.  Your pain medication is not giving enough relief while using the spirometer.  You develop fever of 100.5 F (38.1 C) or higher. SEEK IMMEDIATE MEDICAL CARE IF:   You cough up bloody sputum that had not been present before.  You develop fever of 102 F (38.9 C) or greater.  You develop worsening pain at or near the incision site. MAKE SURE YOU:   Understand these instructions.  Will watch your condition.  Will get help right away if you are not doing well or get worse. Document Released: 01/23/2007 Document Revised: 12/05/2011 Document Reviewed: 03/26/2007 [employer] Patient Information 2014 [employer], [place].   ________________________________________________________________________  WHAT IS A BLOOD TRANSFUSION? Blood Transfusion Information  A transfusion is the replacement of blood or some of its parts. Blood is made up of multiple cells which provide different functions.  Red blood cells carry oxygen and are used for blood loss replacement.  White blood cells fight against infection.  Platelets control bleeding.  Plasma helps clot blood.  Other blood products are available for specialized needs, such as hemophilia or other clotting disorders. BEFORE THE TRANSFUSION  Who gives blood for transfusions?   Healthy volunteers who are fully evaluated to make sure their blood is safe. This is blood bank blood. Transfusion therapy is the safest it has ever been in the practice of medicine. Before blood is taken from a donor, a complete history is taken to make sure that person has no history of diseases nor engages in risky social behavior (examples are  intravenous drug use or sexual activity with multiple partners). The donor's travel history is screened to minimize risk of transmitting infections, such as malaria. The donated blood is tested for signs of infectious diseases, such as HIV and hepatitis. The blood is then tested to be sure it is compatible with you in order to minimize the chance of a transfusion reaction. If you or a relative donates blood, this is often done in anticipation of surgery and is not appropriate for emergency situations. It takes many days to process the donated blood. RISKS AND COMPLICATIONS Although transfusion therapy is very safe and saves many lives, the main dangers of transfusion include:   Getting an infectious disease.  Developing a transfusion reaction. This is an allergic reaction to something in the blood you were given. Every precaution is taken to prevent this. The decision to have a blood transfusion has been considered carefully by your caregiver before blood is given. Blood is not given unless the benefits outweigh the risks. AFTER THE TRANSFUSION  Right after receiving a blood transfusion, you will usually feel much better and more energetic. This is especially true if your red blood cells have gotten low (anemic). The transfusion raises the level of the red blood cells which carry oxygen, and this usually causes an energy increase.  The nurse administering  the transfusion will monitor you carefully for complications. HOME CARE INSTRUCTIONS  No special instructions are needed after a transfusion. You may find your energy is better. Speak with your caregiver about any limitations on activity for underlying diseases you may have. SEEK MEDICAL CARE IF:   Your condition is not improving after your transfusion.  You develop redness or irritation at the intravenous (IV) site. SEEK IMMEDIATE MEDICAL CARE IF:  Any of the following symptoms occur over the next 12 hours:  Shaking chills.  You have a  temperature by mouth above 102 F (38.9 C), not controlled by medicine.  Chest, back, or muscle pain.  People around you feel you are not acting correctly or are confused.  Shortness of breath or difficulty breathing.  Dizziness and fainting.  You get a rash or develop hives.  You have a decrease in urine output.  Your urine turns a dark color or changes to pink, red, or brown. Any of the following symptoms occur over the next 10 days:  You have a temperature by mouth above 102 F (38.9 C), not controlled by medicine.  Shortness of breath.  Weakness after normal activity.  The white part of the eye turns yellow (jaundice).  You have a decrease in the amount of urine or are urinating less often.  Your urine turns a dark color or changes to pink, red, or brown. Document Released: 09/09/2000 Document Revised: 12/05/2011 Document Reviewed: 04/28/2008 [facility] Patient Information 2014 [place], [place].  _______________________________________________________________________

## 2018-04-06 ENCOUNTER — Encounter (HOSPITAL_COMMUNITY)
Admission: RE | Admit: 2018-04-06 | Discharge: 2018-04-06 | Disposition: A | Discharge status: Home / Self Care | Payer: BLUE CROSS/BLUE SHIELD | Source: Ambulatory Visit | Attending: Urology | Admitting: Urology

## 2018-04-06 ENCOUNTER — Other Ambulatory Visit: Payer: Self-pay

## 2018-04-06 ENCOUNTER — Encounter (HOSPITAL_COMMUNITY): Payer: Self-pay

## 2018-04-06 DIAGNOSIS — Z01818 Encounter for other preprocedural examination: Secondary | ICD-10-CM | POA: Diagnosis not present

## 2018-04-06 DIAGNOSIS — Z0183 Encounter for blood typing: Secondary | ICD-10-CM | POA: Diagnosis not present

## 2018-04-06 DIAGNOSIS — Z01812 Encounter for preprocedural laboratory examination: Secondary | ICD-10-CM | POA: Insufficient documentation

## 2018-04-06 DIAGNOSIS — C61 Malignant neoplasm of prostate: Secondary | ICD-10-CM | POA: Insufficient documentation

## 2018-04-06 HISTORY — DX: Umbilical hernia without obstruction or gangrene: K42.9

## 2018-04-06 HISTORY — DX: Malignant neoplasm of prostate: C61

## 2018-04-06 HISTORY — DX: Morbid (severe) obesity due to excess calories: E66.01

## 2018-04-06 HISTORY — DX: Spondylolysis, lumbar region: M43.06

## 2018-04-06 LAB — BASIC METABOLIC PANEL
Anion gap: 9 (ref 5–15)
BUN: 13 mg/dL (ref 6–20)
CALCIUM: 9.7 mg/dL (ref 8.9–10.3)
CO2: 28 mmol/L (ref 22–32)
Chloride: 102 mmol/L (ref 98–111)
Creatinine, Ser: 1.24 mg/dL (ref 0.61–1.24)
GFR calc non Af Amer: >60 mL/min (ref >60)
GLUCOSE: 213 mg/dL — AB (ref 70–99)
Potassium: 4 mmol/L (ref 3.5–5.1)
Sodium: 139 mmol/L (ref 135–145)

## 2018-04-06 LAB — CBC
HCT: 44.1 % (ref 39.0–52.0)
Hemoglobin: 14.5 g/dL (ref 13.0–17.0)
MCH: 30.3 pg (ref 26.0–34.0)
MCHC: 32.9 g/dL (ref 30.0–36.0)
MCV: 92.1 fL (ref 78.0–100.0)
PLATELETS: 347 10*3/uL (ref 150–400)
RBC: 4.79 MIL/uL (ref 4.22–5.81)
RDW: 12.7 % (ref 11.5–15.5)
WBC: 7.1 10*3/uL (ref 4.0–10.5)

## 2018-04-06 LAB — ABO/RH: ABO/RH(D): B POS

## 2018-04-06 NOTE — H&P (Signed)
CC/HPI: CC: Prostate Cancer    [name] is a 52 year old gentleman who was initially diagnosed with Gleason 6 prostate cancer in 2011 at [facility] around age 52. His PSA was 13 at that time and he did not follow up with urology. He presented to Dr. Jeffie Pollock in January 2019 with worsening LUTS. His PSA was noted to have increased to above 20 and was 21.9 when repeated. He underwent a TRUS biopsy of the prostate on 12/15/17 that demonstrated upgraded Gleason 3+4=7 adenocarcinoma with 5 out of 12 biopsy cores positive for malignancy.   He works as a Forensic scientist. He does take care of his older brother who can live independently but does not drive and is unable to fully care for himself. However, he does not need to be with him every night.   Family history: None.   Imaging studies:  Bone scan (12/22/17): No obvious metastatic disease.  CT scan pelvis (01/12/18): Isolated non-specific 11 mm left external iliac lymph node.  Fluciclovine PET (02/23/18): No evidence of metastatic disease. Specifically, no uptake in the borderline enlarged left external iliac lymph node.   PMH: He has a history of hypertension.  PSH: No abdominal surgery.   TNM stage: cT1c N0 M0  PSA: 21.9  Gleason score: 3+4=7  Biopsy (12/15/17): 5/12 cores positive  Left: L lateral apex (5%, 3+3=6), L lateral mid (60%, 3+3=6)  Right: R apex (60%, 3+3=6), R lateral mid (5%, 3+3=6), R lateral base (10%, 3+4=7)  Prostate volume: 45 cc   Nomogram  OC disease: 25%  EPE: 72%  SVI: 7%  LNI: 6%  PFS (5 year, 10 year): 61%, 46%   Urinary function: IPSS is 11. His most bothersome symptoms are nocturia, weak stream, and frequency.  Erectile function: SHIM score is 1. He has erectile dysfunction that has been refractory to PDE 5 inhibitors.     ALLERGIES: None   MEDICATIONS: Tamsulosin Hcl 0.4 mg capsule 1 capsule PO Daily  Norvasc 5 mg tablet tablet PO Daily     GU PSH: Complex Uroflow - 02/16/2018 Prostate Needle Biopsy -  12/15/2017, about 2011    NON-GU PSH: Back surgery - 1998 Surgical Pathology, Gross And Microscopic Examination For Prostate Needle - 12/15/2017    GU PMH: Prostate Cancer (Worsening), He has T1c N(?0-1) M0 Gleason 7(3+4) high risk prostate cancer with moderate LUTs and severe ED. I am going to get an Axumin scan to try to better clarify the nodal status and I will repeat the PSA today since I think the last level was an error. I am going try to have him seen in the [facility], but if the nodes are not clearly involved on Axumin he will be best served by surgical therapy I think. If the nodes are positive, he will either need ADT and XRT or possibly a clinical trial with ADT and surgery. - 02/16/2018, - 12/15/2017, He has a history of small volume Gleason 6 prostate cancer with a PSA of 13 in 2011 and no subsequent f/u. I am going to get a PSA today and have him return for a prostate [place] and biopsy. I have reviewed the risks of bleeding, infection and voiding difficulty. Levaquin sent. , - 10/13/2017 BPH w/LUTS, He has BPH with Moderate to severe LUT's. I am going to start him on tamsulosin and reviewed the side effects. - 10/13/2017 ED due to arterial insufficiency, He has ED but we need to sort out the prostate cancer issue before  addressing this condition. - 10/13/2017 Phimosis, He has moderate to severe phimosis and may require circumcision at some point. - 10/13/2017 Urinary Frequency - 10/13/2017 Weak Urinary Stream - 10/13/2017    NON-GU PMH: Hypertension    FAMILY HISTORY: None   SOCIAL HISTORY: Marital Status: Single Preferred Language: English; Race: Black or African American Current Smoking Status: Patient has never smoked.   Tobacco Use Assessment Completed: Used Tobacco in last 30 days? Has never drank.  Drinks 1 caffeinated drink per day. 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    REVIEW OF SYSTEMS:    GU Review Male:   Patient denies have to strain to urinate , burning/ pain with  urination, get up at night to urinate, leakage of urine, frequent urination, trouble starting your streams, hard to postpone urination, and stream starts and stops.  Gastrointestinal (Upper):   Patient denies nausea and vomiting.  Gastrointestinal (Lower):   Patient denies diarrhea and constipation.  Constitutional:   Patient denies fever, night sweats, weight loss, and fatigue.  Skin:   Patient denies skin rash/ lesion and itching.  Eyes:   Patient denies blurred vision and double vision.  Ears/ Nose/ Throat:   Patient denies sore throat and sinus problems.  Hematologic/Lymphatic:   Patient denies swollen glands and easy bruising.  Cardiovascular:   Patient denies leg swelling and chest pains.  Respiratory:   Patient denies cough and shortness of breath.  Endocrine:   Patient denies excessive thirst.  Musculoskeletal:   Patient denies back pain and joint pain.  Neurological:   Patient denies headaches and dizziness.  Psychologic:   Patient denies depression and anxiety.   VITAL SIGNS:      Weight 300 lb / 136.08 kg  Height 69 in / 175.26 cm  BMI 44.3 kg/m    MULTI-SYSTEM PHYSICAL EXAMINATION:    Constitutional: Well-nourished. No physical deformities. Normally developed. Good grooming.  Neck: Neck symmetrical, not swollen. Normal tracheal position.  Respiratory: No labored breathing, no use of accessory muscles. Normal breath sounds. Clear bilaterally.  Cardiovascular: Regular rate and rhythm. No murmur, no gallop. Normal temperature, normal extremity pulses, no swelling, no varicosities.  Lymphatic: No enlargement of neck, axillae, groin.  Skin: No paleness, no jaundice, no cyanosis. No lesion, no ulcer, no rash.  Neurologic / Psychiatric: Oriented to time, oriented to place, oriented to person. No depression, no anxiety, no agitation.  Gastrointestinal: Obese, soft, nondistended.  Eyes: Normal conjunctivae. Normal eyelids.  Ears, Nose, Mouth, and Throat: Left ear no scars, no  lesions, no masses. Right ear no scars, no lesions, no masses. Nose no scars, no lesions, no masses. Normal hearing. Normal lips.  Musculoskeletal: Normal gait and station of head and neck.     ASSESSMENT:      ICD-10 Details  1 GU:   Prostate Cancer - C61    PLAN:       1. High risk, localized prostate cancer:  He does wish to proceed with surgical therapy and has been scheduled for a bilateral nerve-sparing robot assisted laparoscopic radical prostatectomy and pelvic lymphadenectomy.

## 2018-04-06 NOTE — Pre-Procedure Instructions (Signed)
BMP results 04/09/2018 faxed to Dr. Alinda Money via epic.

## 2018-04-07 ENCOUNTER — Other Ambulatory Visit: Payer: Self-pay | Admitting: Family Medicine

## 2018-04-08 MED ORDER — DEXTROSE 5 % IV SOLN
3.0000 g | INTRAVENOUS | Status: AC
  Administered 2018-04-09: 3 g via INTRAVENOUS
  Filled 2018-04-08: qty 3

## 2018-04-08 NOTE — Anesthesia Preprocedure Evaluation (Addendum)
Anesthesia Evaluation  Patient identified by MRN, date of birth, ID band Patient awake    Reviewed: Allergy & Precautions, NPO status , Patient's Chart, lab work & pertinent test results  Airway Mallampati: II  TM Distance: >3 FB Neck ROM: Full    Dental   Pulmonary asthma ,    breath sounds clear to auscultation       Cardiovascular hypertension, Pt. on medications  Rhythm:Regular Rate:Normal     Neuro/Psych negative neurological ROS     GI/Hepatic negative GI ROS, Neg liver ROS,   Endo/Other  Morbid obesity  Renal/GU negative Renal ROS     Musculoskeletal  (+) Arthritis ,   Abdominal   Peds  Hematology negative hematology ROS (+)   Anesthesia Other Findings   Reproductive/Obstetrics                            Lab Results  Component Value Date   WBC 7.1 04/06/2018   HGB 14.5 04/06/2018   HCT 44.1 04/06/2018   MCV 92.1 04/06/2018   PLT 347 04/06/2018   Lab Results  Component Value Date   CREATININE 1.24 04/06/2018   BUN 13 04/06/2018   NA 139 04/06/2018   K 4.0 04/06/2018   CL 102 04/06/2018   CO2 28 04/06/2018    Anesthesia Physical Anesthesia Plan  ASA: III  Anesthesia Plan: General   Post-op Pain Management:    Induction: Intravenous  PONV Risk Score and Plan: 3 and Ondansetron, Dexamethasone, Treatment may vary due to age or medical condition and Midazolam  Airway Management Planned: Oral ETT  Additional Equipment:   Intra-op Plan:   Post-operative Plan: Extubation in OR  Informed Consent: I have reviewed the patients History and Physical, chart, labs and discussed the procedure including the risks, benefits and alternatives for the proposed anesthesia with the patient or authorized representative who has indicated his/her understanding and acceptance.   Dental advisory given  Plan Discussed with: CRNA  Anesthesia Plan Comments:        Anesthesia  Quick Evaluation

## 2018-04-09 ENCOUNTER — Other Ambulatory Visit: Payer: Self-pay

## 2018-04-09 ENCOUNTER — Encounter (HOSPITAL_COMMUNITY)
Admission: RE | Disposition: A | Discharge status: Home / Self Care | Payer: Self-pay | Source: Ambulatory Visit | Attending: Urology

## 2018-04-09 ENCOUNTER — Ambulatory Visit (HOSPITAL_COMMUNITY): Payer: BLUE CROSS/BLUE SHIELD | Admitting: Anesthesiology

## 2018-04-09 ENCOUNTER — Observation Stay (HOSPITAL_COMMUNITY)
Admission: RE | Admit: 2018-04-09 | Discharge: 2018-04-10 | Disposition: A | Discharge status: Home / Self Care | Payer: BLUE CROSS/BLUE SHIELD | Source: Ambulatory Visit | Attending: Urology | Admitting: Urology

## 2018-04-09 ENCOUNTER — Encounter (HOSPITAL_COMMUNITY): Payer: Self-pay | Admitting: *Deleted

## 2018-04-09 DIAGNOSIS — I10 Essential (primary) hypertension: Secondary | ICD-10-CM | POA: Diagnosis not present

## 2018-04-09 DIAGNOSIS — Z6841 Body Mass Index (BMI) 40.0 and over, adult: Secondary | ICD-10-CM | POA: Insufficient documentation

## 2018-04-09 DIAGNOSIS — Z8546 Personal history of malignant neoplasm of prostate: Secondary | ICD-10-CM | POA: Diagnosis present

## 2018-04-09 DIAGNOSIS — J45909 Unspecified asthma, uncomplicated: Secondary | ICD-10-CM | POA: Diagnosis not present

## 2018-04-09 DIAGNOSIS — C61 Malignant neoplasm of prostate: Principal | ICD-10-CM | POA: Insufficient documentation

## 2018-04-09 DIAGNOSIS — Z79899 Other long term (current) drug therapy: Secondary | ICD-10-CM | POA: Insufficient documentation

## 2018-04-09 HISTORY — PX: LYMPHADENECTOMY: SHX5960

## 2018-04-09 HISTORY — PX: ROBOT ASSISTED LAPAROSCOPIC RADICAL PROSTATECTOMY: SHX5141

## 2018-04-09 LAB — HEMOGLOBIN AND HEMATOCRIT, BLOOD
HCT: 40.6 % (ref 39.0–52.0)
Hemoglobin: 13 g/dL (ref 13.0–17.0)

## 2018-04-09 LAB — TYPE AND SCREEN
ABO/RH(D): B POS
Antibody Screen: NEGATIVE

## 2018-04-09 SURGERY — XI ROBOTIC ASSISTED LAPAROSCOPIC RADICAL PROSTATECTOMY LEVEL 3
Anesthesia: General

## 2018-04-09 MED ORDER — LACTATED RINGERS IV SOLN
INTRAVENOUS | Status: DC | PRN
  Administered 2018-04-09 (×2): via INTRAVENOUS

## 2018-04-09 MED ORDER — DIPHENHYDRAMINE HCL 50 MG/ML IJ SOLN: 12.5000 mg | Freq: Four times a day (QID) | INTRAMUSCULAR | Status: DC | PRN

## 2018-04-09 MED ORDER — LIDOCAINE 2% (20 MG/ML) 5 ML SYRINGE
INTRAMUSCULAR | Status: AC
  Filled 2018-04-09: qty 5

## 2018-04-09 MED ORDER — MIDAZOLAM HCL 2 MG/2ML IJ SOLN
INTRAMUSCULAR | Status: AC
  Filled 2018-04-09: qty 2

## 2018-04-09 MED ORDER — PROPOFOL 10 MG/ML IV BOLUS
INTRAVENOUS | Status: AC
  Filled 2018-04-09: qty 20

## 2018-04-09 MED ORDER — KCL IN DEXTROSE-NACL 20-5-0.45 MEQ/L-%-% IV SOLN
INTRAVENOUS | Status: DC
  Administered 2018-04-09 – 2018-04-10 (×3): via INTRAVENOUS
  Filled 2018-04-09 (×3): qty 1000

## 2018-04-09 MED ORDER — LACTATED RINGERS IV SOLN
INTRAVENOUS | Status: DC
  Administered 2018-04-09: 08:00:00 via INTRAVENOUS

## 2018-04-09 MED ORDER — SODIUM CHLORIDE 0.9 % IR SOLN
Status: DC | PRN
  Administered 2018-04-09: 1000 mL via INTRAVESICAL

## 2018-04-09 MED ORDER — PHENYLEPHRINE 40 MCG/ML (10ML) SYRINGE FOR IV PUSH (FOR BLOOD PRESSURE SUPPORT)
PREFILLED_SYRINGE | INTRAVENOUS | Status: AC
  Filled 2018-04-09: qty 10

## 2018-04-09 MED ORDER — DEXAMETHASONE SODIUM PHOSPHATE 10 MG/ML IJ SOLN
INTRAMUSCULAR | Status: AC
  Filled 2018-04-09: qty 1

## 2018-04-09 MED ORDER — FENTANYL CITRATE (PF) 250 MCG/5ML IJ SOLN
INTRAMUSCULAR | Status: AC
  Filled 2018-04-09: qty 5

## 2018-04-09 MED ORDER — ROCURONIUM BROMIDE 10 MG/ML (PF) SYRINGE
PREFILLED_SYRINGE | INTRAVENOUS | Status: AC
  Filled 2018-04-09: qty 10

## 2018-04-09 MED ORDER — DIPHENHYDRAMINE HCL 12.5 MG/5ML PO ELIX: 12.5000 mg | ORAL_SOLUTION | Freq: Four times a day (QID) | ORAL | Status: DC | PRN

## 2018-04-09 MED ORDER — KETOROLAC TROMETHAMINE 15 MG/ML IJ SOLN
15.0000 mg | Freq: Four times a day (QID) | INTRAMUSCULAR | Status: DC
  Administered 2018-04-09 – 2018-04-10 (×4): 15 mg via INTRAVENOUS
  Filled 2018-04-09 (×4): qty 1

## 2018-04-09 MED ORDER — ROCURONIUM BROMIDE 100 MG/10ML IV SOLN
INTRAVENOUS | Status: DC | PRN
  Administered 2018-04-09: 20 mg via INTRAVENOUS
  Administered 2018-04-09: 30 mg via INTRAVENOUS
  Administered 2018-04-09: 20 mg via INTRAVENOUS
  Administered 2018-04-09: 70 mg via INTRAVENOUS

## 2018-04-09 MED ORDER — BUPIVACAINE-EPINEPHRINE 0.25% -1:200000 IJ SOLN
INTRAMUSCULAR | Status: DC | PRN
  Administered 2018-04-09: 30 mL

## 2018-04-09 MED ORDER — FENTANYL CITRATE (PF) 100 MCG/2ML IJ SOLN
INTRAMUSCULAR | Status: DC | PRN
  Administered 2018-04-09: 150 ug via INTRAVENOUS
  Administered 2018-04-09 (×2): 50 ug via INTRAVENOUS

## 2018-04-09 MED ORDER — SUCCINYLCHOLINE CHLORIDE 200 MG/10ML IV SOSY
PREFILLED_SYRINGE | INTRAVENOUS | Status: AC
  Filled 2018-04-09: qty 10

## 2018-04-09 MED ORDER — DEXAMETHASONE SODIUM PHOSPHATE 10 MG/ML IJ SOLN
INTRAMUSCULAR | Status: DC | PRN
  Administered 2018-04-09: 10 mg via INTRAVENOUS

## 2018-04-09 MED ORDER — ONDANSETRON HCL 4 MG/2ML IJ SOLN: 4.0000 mg | Freq: Once | INTRAMUSCULAR | Status: DC | PRN

## 2018-04-09 MED ORDER — ONDANSETRON HCL 4 MG/2ML IJ SOLN
INTRAMUSCULAR | Status: DC | PRN
  Administered 2018-04-09: 4 mg via INTRAVENOUS

## 2018-04-09 MED ORDER — AMLODIPINE BESYLATE 5 MG PO TABS
5.0000 mg | ORAL_TABLET | Freq: Every day | ORAL | Status: DC
  Administered 2018-04-10: 5 mg via ORAL
  Filled 2018-04-09: qty 1

## 2018-04-09 MED ORDER — BUPIVACAINE-EPINEPHRINE (PF) 0.25% -1:200000 IJ SOLN
INTRAMUSCULAR | Status: AC
  Filled 2018-04-09: qty 30

## 2018-04-09 MED ORDER — ORAL CARE MOUTH RINSE
15.0000 mL | Freq: Two times a day (BID) | OROMUCOSAL | Status: DC
  Administered 2018-04-09: 15 mL via OROMUCOSAL

## 2018-04-09 MED ORDER — TRAMADOL HCL 50 MG PO TABS: 50.0000 mg | ORAL_TABLET | Freq: Four times a day (QID) | ORAL | 0 refills | Status: DC | PRN

## 2018-04-09 MED ORDER — ACETAMINOPHEN 325 MG PO TABS: 650.0000 mg | ORAL_TABLET | ORAL | Status: DC | PRN

## 2018-04-09 MED ORDER — ONDANSETRON HCL 4 MG/2ML IJ SOLN
INTRAMUSCULAR | Status: AC
  Filled 2018-04-09: qty 2

## 2018-04-09 MED ORDER — DOCUSATE SODIUM 100 MG PO CAPS
100.0000 mg | ORAL_CAPSULE | Freq: Two times a day (BID) | ORAL | Status: DC
  Administered 2018-04-09 – 2018-04-10 (×2): 100 mg via ORAL
  Filled 2018-04-09 (×2): qty 1

## 2018-04-09 MED ORDER — SUGAMMADEX SODIUM 500 MG/5ML IV SOLN
INTRAVENOUS | Status: DC | PRN
  Administered 2018-04-09: 100 mg via INTRAVENOUS
  Administered 2018-04-09: 300 mg via INTRAVENOUS

## 2018-04-09 MED ORDER — EPHEDRINE SULFATE 50 MG/ML IJ SOLN
INTRAMUSCULAR | Status: DC | PRN
  Administered 2018-04-09: 10 mg via INTRAVENOUS

## 2018-04-09 MED ORDER — HEPARIN SODIUM (PORCINE) 1000 UNIT/ML IJ SOLN
INTRAMUSCULAR | Status: AC
  Filled 2018-04-09: qty 1

## 2018-04-09 MED ORDER — SULFAMETHOXAZOLE-TRIMETHOPRIM 800-160 MG PO TABS: 1.0000 | ORAL_TABLET | Freq: Two times a day (BID) | ORAL | 0 refills | Status: DC

## 2018-04-09 MED ORDER — CEFAZOLIN SODIUM-DEXTROSE 1-4 GM/50ML-% IV SOLN
1.0000 g | Freq: Three times a day (TID) | INTRAVENOUS | Status: AC
  Administered 2018-04-09 (×2): 1 g via INTRAVENOUS
  Filled 2018-04-09 (×2): qty 50

## 2018-04-09 MED ORDER — PROPOFOL 10 MG/ML IV BOLUS
INTRAVENOUS | Status: DC | PRN
  Administered 2018-04-09: 200 mg via INTRAVENOUS

## 2018-04-09 MED ORDER — FENTANYL CITRATE (PF) 100 MCG/2ML IJ SOLN
25.0000 ug | INTRAMUSCULAR | Status: DC | PRN
  Administered 2018-04-09 (×2): 50 ug via INTRAVENOUS

## 2018-04-09 MED ORDER — MIDAZOLAM HCL 5 MG/5ML IJ SOLN
INTRAMUSCULAR | Status: DC | PRN
  Administered 2018-04-09: 2 mg via INTRAVENOUS

## 2018-04-09 MED ORDER — SODIUM CHLORIDE 0.9 % IV SOLN
INTRAVENOUS | Status: DC | PRN
  Administered 2018-04-09: 50 ug/min via INTRAVENOUS

## 2018-04-09 MED ORDER — PHENYLEPHRINE HCL 10 MG/ML IJ SOLN
INTRAMUSCULAR | Status: DC | PRN
  Administered 2018-04-09 (×2): 80 ug via INTRAVENOUS
  Administered 2018-04-09 (×2): 120 ug via INTRAVENOUS

## 2018-04-09 MED ORDER — FENTANYL CITRATE (PF) 100 MCG/2ML IJ SOLN
INTRAMUSCULAR | Status: AC
  Filled 2018-04-09: qty 2

## 2018-04-09 MED ORDER — SUGAMMADEX SODIUM 500 MG/5ML IV SOLN
INTRAVENOUS | Status: AC
  Filled 2018-04-09: qty 5

## 2018-04-09 MED ORDER — STERILE WATER FOR IRRIGATION IR SOLN
Status: DC | PRN
  Administered 2018-04-09: 1000 mL

## 2018-04-09 MED ORDER — LACTATED RINGERS IV SOLN
INTRAVENOUS | Status: DC | PRN
  Administered 2018-04-09: 1000 mL

## 2018-04-09 MED ORDER — LIDOCAINE HCL (CARDIAC) PF 100 MG/5ML IV SOSY
PREFILLED_SYRINGE | INTRAVENOUS | Status: DC | PRN
  Administered 2018-04-09: 100 mg via INTRAVENOUS

## 2018-04-09 MED ORDER — ENOXAPARIN SODIUM 40 MG/0.4ML ~~LOC~~ SOLN: 40.0000 mg | SUBCUTANEOUS | Status: DC

## 2018-04-09 MED ORDER — SODIUM CHLORIDE 0.9 % IV BOLUS
1000.0000 mL | Freq: Once | INTRAVENOUS | Status: AC
  Administered 2018-04-09: 1000 mL via INTRAVENOUS

## 2018-04-09 MED ORDER — MORPHINE SULFATE (PF) 4 MG/ML IV SOLN
2.0000 mg | INTRAVENOUS | Status: DC | PRN
  Administered 2018-04-09: 2 mg via INTRAVENOUS
  Administered 2018-04-09 (×2): 4 mg via INTRAVENOUS
  Filled 2018-04-09 (×3): qty 1

## 2018-04-09 SURGICAL SUPPLY — 57 items
ADH SKN CLS APL DERMABOND .7 (GAUZE/BANDAGES/DRESSINGS) ×2
APPLICATOR COTTON TIP 6IN STRL (MISCELLANEOUS) ×3 IMPLANT
CATH FOLEY 2WAY SLVR 18FR 30CC (CATHETERS) ×3 IMPLANT
CATH ROBINSON RED A/P 16FR (CATHETERS) ×3 IMPLANT
CATH ROBINSON RED A/P 8FR (CATHETERS) ×3 IMPLANT
CATH TIEMANN FOLEY 18FR 5CC (CATHETERS) ×3 IMPLANT
CHLORAPREP W/TINT 26ML (MISCELLANEOUS) ×3 IMPLANT
CLIP VESOLOCK LG 6/CT PURPLE (CLIP) ×6 IMPLANT
COVER SURGICAL LIGHT HANDLE (MISCELLANEOUS) ×3 IMPLANT
COVER TIP SHEARS 8 DVNC (MISCELLANEOUS) ×2 IMPLANT
COVER TIP SHEARS 8MM DA VINCI (MISCELLANEOUS) ×1
CUTTER ECHEON FLEX ENDO 45 340 (ENDOMECHANICALS) ×3 IMPLANT
DECANTER SPIKE VIAL GLASS SM (MISCELLANEOUS) ×3 IMPLANT
DERMABOND ADVANCED (GAUZE/BANDAGES/DRESSINGS) ×1
DERMABOND ADVANCED .7 DNX12 (GAUZE/BANDAGES/DRESSINGS) IMPLANT
DRAPE ARM DVNC X/XI (DISPOSABLE) ×8 IMPLANT
DRAPE COLUMN DVNC XI (DISPOSABLE) ×2 IMPLANT
DRAPE DA VINCI XI ARM (DISPOSABLE) ×4
DRAPE DA VINCI XI COLUMN (DISPOSABLE) ×1
DRAPE SURG IRRIG POUCH 19X23 (DRAPES) ×3 IMPLANT
DRSG TEGADERM 4X4.75 (GAUZE/BANDAGES/DRESSINGS) ×3 IMPLANT
ELECT PENCIL ROCKER SW 15FT (MISCELLANEOUS) ×1 IMPLANT
ELECT REM PT RETURN 15FT ADLT (MISCELLANEOUS) ×3 IMPLANT
GLOVE BIO SURGEON STRL SZ 6.5 (GLOVE) ×3 IMPLANT
GLOVE BIOGEL M STRL SZ7.5 (GLOVE) ×6 IMPLANT
GOWN STRL REUS W/TWL LRG LVL3 (GOWN DISPOSABLE) ×9 IMPLANT
HOLDER FOLEY CATH W/STRAP (MISCELLANEOUS) ×3 IMPLANT
IRRIG SUCT STRYKERFLOW 2 WTIP (MISCELLANEOUS) ×3
IRRIGATION SUCT STRKRFLW 2 WTP (MISCELLANEOUS) ×2 IMPLANT
IV LACTATED RINGERS 1000ML (IV SOLUTION) ×3 IMPLANT
NDL SAFETY ECLIPSE 18X1.5 (NEEDLE) ×2 IMPLANT
NEEDLE HYPO 18GX1.5 SHARP (NEEDLE) ×3
PACK ROBOT UROLOGY CUSTOM (CUSTOM PROCEDURE TRAY) ×3 IMPLANT
RELOAD STAPLE 45 4.1 GRN THCK (STAPLE) ×2 IMPLANT
SEAL CANN UNIV 5-8 DVNC XI (MISCELLANEOUS) ×8 IMPLANT
SEAL XI 5MM-8MM UNIVERSAL (MISCELLANEOUS) ×4
SOLUTION ELECTROLUBE (MISCELLANEOUS) ×3 IMPLANT
STAPLE RELOAD 45 GRN (STAPLE) ×2 IMPLANT
STAPLE RELOAD 45MM GREEN (STAPLE) ×3
SUT ETHILON 3 0 PS 1 (SUTURE) ×3 IMPLANT
SUT MNCRL 3 0 RB1 (SUTURE) ×2 IMPLANT
SUT MNCRL 3 0 VIOLET RB1 (SUTURE) ×2 IMPLANT
SUT MNCRL AB 4-0 PS2 18 (SUTURE) ×6 IMPLANT
SUT MONOCRYL 3 0 RB1 (SUTURE) ×2
SUT VIC AB 0 CT1 27 (SUTURE) ×3
SUT VIC AB 0 CT1 27XBRD ANTBC (SUTURE) ×2 IMPLANT
SUT VIC AB 0 UR5 27 (SUTURE) ×3 IMPLANT
SUT VIC AB 2-0 SH 27 (SUTURE) ×3
SUT VIC AB 2-0 SH 27X BRD (SUTURE) ×2 IMPLANT
SUT VIC AB 3-0 SH 27 (SUTURE) ×3
SUT VIC AB 3-0 SH 27XBRD (SUTURE) IMPLANT
SUT VICRYL 0 UR6 27IN ABS (SUTURE) ×6 IMPLANT
SYR 27GX1/2 1ML LL SAFETY (SYRINGE) ×3 IMPLANT
TOWEL OR 17X26 10 PK STRL BLUE (TOWEL DISPOSABLE) ×3 IMPLANT
TOWEL OR NON WOVEN STRL DISP B (DISPOSABLE) ×3 IMPLANT
TUBING INSUFFLATION 10FT LAP (TUBING) IMPLANT
WATER STERILE IRR 1000ML POUR (IV SOLUTION) ×6 IMPLANT

## 2018-04-09 NOTE — Transfer of Care (Signed)
Immediate Anesthesia Transfer of Care Note  Patient: Robert Mcintyre  Procedure(s) Performed: XI ROBOTIC ASSISTED LAPAROSCOPIC RADICAL PROSTATECTOMY LEVEL 3 (N/A ) LYMPHADENECTOMY (Bilateral )  Patient Location: PACU  Anesthesia Type:General  Level of Consciousness: sedated  Airway & Oxygen Therapy: Patient connected to face mask oxygen  Post-op Assessment: Report given to RN and Post -op Vital signs reviewed and stable  Post vital signs: Reviewed and stable  Last Vitals:  Vitals Value Taken Time  BP 131/72 04/09/2018 11:10 AM  Temp    Pulse 95 04/09/2018 11:12 AM  Resp 20 04/09/2018 11:12 AM  SpO2 96 % 04/09/2018 11:12 AM  Vitals shown include unvalidated device data.  Last Pain:  Vitals:   04/09/18 0550  TempSrc: Oral      Patients Stated Pain Goal: 4 (41/44/36 0165)  Complications: No apparent anesthesia complications

## 2018-04-09 NOTE — Op Note (Signed)
Preoperative diagnosis: Clinically localized adenocarcinoma of the prostate (clinical stage T1c N0 M0)  Postoperative diagnosis: Clinically localized adenocarcinoma of the prostate (clinical stage T1c N0 M0)  Procedure:  1. Robotic assisted laparoscopic radical prostatectomy (bilateral nerve sparing) 2. Bilateral robotic assisted laparoscopic pelvic lymphadenectomy  Surgeon: Pryor Curia. M.D.  Assistant: Debbrah Alar, PA-C  An assistant was required for this surgical procedure.  The duties of the assistant included but were not limited to suctioning, passing suture, camera manipulation, retraction. This procedure would not be able to be performed without an Environmental consultant.  Resident: Dr. Lewie Loron  Anesthesia: General  Complications: None  EBL: 300 mL  IVF:  2000 mL crystalloid  Specimens: 1. Prostate and seminal vesicles 2. Right pelvic lymph nodes 3. Left pelvic lymph nodes  Disposition of specimens: Pathology  Drains: 1. 20 Fr coude catheter 2. # 19 Blake pelvic drain  Indication: Robert Mcintyre is a 52 y.o. year old patient with clinically localized prostate cancer.  After a thorough review of the management options for treatment of prostate cancer, he elected to proceed with surgical therapy and the above procedure(s).  We have discussed the potential benefits and risks of the procedure, side effects of the proposed treatment, the likelihood of the patient achieving the goals of the procedure, and any potential problems that might occur during the procedure or recuperation. Informed consent has been obtained.  Description of procedure:  The patient was taken to the operating room and a general anesthetic was administered. He was given preoperative antibiotics, placed in the dorsal lithotomy position, and prepped and draped in the usual sterile fashion. Next a preoperative timeout was performed. A urethral catheter was placed into the bladder and a site was selected near  the umbilicus for placement of the camera port. This was placed using a standard open Hassan technique which allowed entry into the peritoneal cavity under direct vision and without difficulty. An 8 mm robotic port was placed and a pneumoperitoneum established. The camera was then used to inspect the abdomen and there was no evidence of any intra-abdominal injuries or other abnormalities. The remaining abdominal ports were then placed. 8 mm robotic ports were placed in the right lower quadrant, left lower quadrant, and far left lateral abdominal wall. A 5 mm port was placed in the right upper quadrant and a 12 mm port was placed in the right lateral abdominal wall for laparoscopic assistance. All ports were placed under direct vision without difficulty. The surgical cart was then docked.   Utilizing the cautery scissors, the bladder was reflected posteriorly allowing entry into the space of Retzius and identification of the endopelvic fascia and prostate. The periprostatic fat was then removed from the prostate allowing full exposure of the endopelvic fascia. The endopelvic fascia was then incised from the apex back to the base of the prostate bilaterally and the underlying levator muscle fibers were swept laterally off the prostate thereby isolating the dorsal venous complex. The dorsal vein was then stapled and divided with a 45 mm Flex Echelon stapler. Attention then turned to the bladder neck which was divided anteriorly thereby allowing entry into the bladder and exposure of the urethral catheter. The catheter balloon was deflated and the catheter was brought into the operative field and used to retract the prostate anteriorly. The posterior bladder neck was then examined and was divided allowing further dissection between the bladder and prostate posteriorly until the vasa deferentia and seminal vessels were identified. The vasa deferentia were  isolated, divided, and lifted anteriorly. The seminal vesicles  were dissected down to their tips with care to control the seminal vascular arterial blood supply. These structures were then lifted anteriorly and the space between Denonvillier's fascia and the anterior rectum was developed with a combination of sharp and blunt dissection. This isolated the vascular pedicles of the prostate.  The lateral prostatic fascia was then sharply incised allowing release of the neurovascular bundles bilaterally. The vascular pedicles of the prostate were then ligated with Weck clips between the prostate and neurovascular bundles and divided with sharp cold scissor dissection resulting in neurovascular bundle preservation. The neurovascular bundles were then separated off the apex of the prostate and urethra bilaterally.  The urethra was then sharply transected allowing the prostate specimen to be disarticulated. The pelvis was copiously irrigated and hemostasis was ensured. There was no evidence for rectal injury.  Attention then turned to the right pelvic sidewall. The fibrofatty tissue between the external iliac vein, confluence of the iliac vessels, hypogastric artery, and Cooper's ligament was dissected free from the pelvic sidewall with care to preserve the obturator nerve. Weck clips were used for lymphostasis and hemostasis. An identical procedure was performed on the contralateral side and the lymphatic packets were removed for permanent pathologic analysis.  Attention then turned to the urethral anastomosis. A 2-0 Vicryl slip knot was placed between Denonvillier's fascia, the posterior bladder neck, and the posterior urethra to reapproximate these structures. A double-armed 3-0 Monocryl suture was then used to perform a 360 running tension-free anastomosis between the bladder neck and urethra. A new urethral catheter was then placed into the bladder and irrigated. There were no blood clots within the bladder and the anastomosis appeared to be watertight. A #19 Blake drain  was then brought through the left lateral 8 mm port site and positioned appropriately within the pelvis. It was secured to the skin with a nylon suture. The surgical cart was then undocked. The right lateral 12 mm port site was closed at the fascial level with a 0 Vicryl suture placed laparoscopically. All remaining ports were then removed under direct vision. The prostate specimen was removed intact within the Endopouch retrieval bag via the periumbilical camera port site. This fascial opening was closed with two running 0 Vicryl sutures. 0.25% Marcaine was then injected into all port sites and all incisions were reapproximated at the skin level with 4-0 Monocryl subcuticular sutures and Dermabond. The patient appeared to tolerate the procedure well and without complications. The patient was able to be extubated and transferred to the recovery unit in satisfactory condition.   Pryor Curia MD

## 2018-04-09 NOTE — Discharge Instructions (Signed)

## 2018-04-09 NOTE — Anesthesia Postprocedure Evaluation (Signed)
Anesthesia Post Note  Patient: Robert Mcintyre  Procedure(s) Performed: XI ROBOTIC ASSISTED LAPAROSCOPIC RADICAL PROSTATECTOMY LEVEL 3 (N/A ) LYMPHADENECTOMY (Bilateral )     Patient location during evaluation: PACU Anesthesia Type: General Level of consciousness: awake and alert Pain management: pain level controlled Vital Signs Assessment: post-procedure vital signs reviewed and stable Respiratory status: spontaneous breathing, nonlabored ventilation, respiratory function stable and patient connected to nasal cannula oxygen Cardiovascular status: blood pressure returned to baseline and stable Postop Assessment: no apparent nausea or vomiting Anesthetic complications: no    Last Vitals:  Vitals:   04/09/18 1200 04/09/18 1215  BP: 140/74 120/62  Pulse: 95 94  Resp: (!) 22 20  Temp: 36.4 C 36.5 C  SpO2: 95% 92%    Last Pain:  Vitals:   04/09/18 1200  TempSrc:   PainSc: 5                  Tiajuana Amass

## 2018-04-09 NOTE — Anesthesia Procedure Notes (Signed)
Procedure Name: Intubation Date/Time: 04/09/2018 7:28 AM Performed by: Colin Rhein, CRNA Pre-anesthesia Checklist: Patient identified, Suction available and Patient being monitored Patient Re-evaluated:Patient Re-evaluated prior to induction Oxygen Delivery Method: Circle system utilized Preoxygenation: Pre-oxygenation with 100% oxygen Induction Type: IV induction Ventilation: Mask ventilation with difficulty Laryngoscope Size: Mac and 4 Grade View: Grade III Tube type: Oral Tube size: 7.5 mm Number of attempts: 1 Airway Equipment and Method: Patient positioned with wedge pillow Placement Confirmation: ETT inserted through vocal cords under direct vision,  positive ETCO2 and breath sounds checked- equal and bilateral Secured at: 23 cm Tube secured with: Tape Dental Injury: Teeth and Oropharynx as per pre-operative assessment

## 2018-04-09 NOTE — Progress Notes (Signed)
Patient ID: Robert Mcintyre, male   DOB: 12-Aug-1966, 52 y.o.   MRN: [id number]  Post-op note  Subjective: The patient is doing well.  No complaints.  Objective: Vital signs in last 24 hours: Temp:  [97.5 F (36.4 C)-98.4 F (36.9 C)] 97.5 F (36.4 C) (07/15 1355) Pulse Rate:  [88-102] 88 (07/15 1355) Resp:  [11-22] 18 (07/15 1355) BP: (120-165)/(62-97) 127/75 (07/15 1355) SpO2:  [89 %-99 %] 99 % (07/15 1355) Weight:  [139.3 kg (307 lb)] 139.3 kg (307 lb) (07/15 0603)  Intake/Output from previous day: No intake/output data recorded. Intake/Output this shift: Total I/O In: 4012.5 [P.O.:480; I.V.:3482.5; IV Piggyback:50] Out: 710 [Urine:450; Drains:60; Blood:200]  Physical Exam:  General: Alert and oriented. Abdomen: Soft, Nondistended. Incisions: Clean and dry. GU: Urine clear.  Lab Results: Recent Labs    04/09/18 1150  HGB 13.0  HCT 40.6    Assessment/Plan: POD#0   1) Continue to monitor, ambulate, IS   Pryor Curia. MD   LOS: 0 days   Gianna Calef,LES 04/09/2018, 6:08 PM

## 2018-04-10 DIAGNOSIS — Z6841 Body Mass Index (BMI) 40.0 and over, adult: Secondary | ICD-10-CM | POA: Diagnosis not present

## 2018-04-10 DIAGNOSIS — C61 Malignant neoplasm of prostate: Secondary | ICD-10-CM | POA: Diagnosis not present

## 2018-04-10 DIAGNOSIS — Z79899 Other long term (current) drug therapy: Secondary | ICD-10-CM | POA: Diagnosis not present

## 2018-04-10 DIAGNOSIS — I10 Essential (primary) hypertension: Secondary | ICD-10-CM | POA: Diagnosis not present

## 2018-04-10 LAB — HEMOGLOBIN AND HEMATOCRIT, BLOOD
HEMATOCRIT: 38.6 % — AB (ref 39.0–52.0)
HEMOGLOBIN: 12.3 g/dL — AB (ref 13.0–17.0)

## 2018-04-10 MED ORDER — SODIUM CHLORIDE 0.9% FLUSH: 3.0000 mL | INTRAVENOUS | Status: DC | PRN

## 2018-04-10 MED ORDER — BISACODYL 10 MG RE SUPP
10.0000 mg | Freq: Once | RECTAL | Status: AC
  Administered 2018-04-10: 10 mg via RECTAL
  Filled 2018-04-10: qty 1

## 2018-04-10 MED ORDER — SODIUM CHLORIDE 0.9 % IV SOLN: 250.0000 mL | INTRAVENOUS | Status: DC | PRN

## 2018-04-10 MED ORDER — SODIUM CHLORIDE 0.9% FLUSH: 3.0000 mL | Freq: Two times a day (BID) | INTRAVENOUS | Status: DC

## 2018-04-10 MED ORDER — TRAMADOL HCL 50 MG PO TABS
50.0000 mg | ORAL_TABLET | Freq: Four times a day (QID) | ORAL | Status: DC | PRN
  Administered 2018-04-10: 50 mg via ORAL
  Filled 2018-04-10: qty 1

## 2018-04-10 NOTE — Progress Notes (Signed)
Patient ID: Robert Mcintyre, male   DOB: 11-26-1965, 52 y.o.   MRN: [id number]  1 Day Post-Op Subjective: The patient is doing well.  No nausea or vomiting. Pain is adequately controlled.  Objective: Vital signs in last 24 hours: Temp:  [97.5 F (36.4 C)-99 F (37.2 C)] 99 F (37.2 C) (07/16 0530) Pulse Rate:  [79-102] 79 (07/16 0530) Resp:  [11-22] 17 (07/16 0530) BP: (120-161)/(62-88) 143/88 (07/16 0530) SpO2:  [89 %-99 %] 95 % (07/16 0530)  Intake/Output from previous day: 07/15 0701 - 07/16 0700 In: 6452.5 [P.O.:1320; I.V.:4982.5; IV Piggyback:150] Out: 8563 [Urine:2450; Drains:90; Blood:200] Intake/Output this shift: No intake/output data recorded.  Physical Exam:  General: Alert and oriented. CV: RRR Lungs: Clear bilaterally. GI: Soft, Nondistended. Incisions: Clean, dry, and intact Urine: Clear Extremities: Nontender, no erythema, no edema.  Lab Results: Recent Labs    04/09/18 1150 04/10/18 0542  HGB 13.0 12.3*  HCT 40.6 38.6*      Assessment/Plan: POD# 1 s/p robotic prostatectomy.  1) SL IVF 2) Ambulate, Incentive spirometry 3) Transition to oral pain medication 4) Dulcolax suppository 5) D/C pelvic drain 6) Plan for likely discharge later today   Robert Mcintyre. MD   LOS: 0 days   Robert Mcintyre,LES 04/10/2018, 8:29 AM

## 2018-04-10 NOTE — Plan of Care (Signed)
  Problem: Education: Goal: Knowledge of the procedure and recovery process will improve Outcome: Adequate for Discharge   Problem: Bowel/Gastric: Goal: Gastrointestinal status for postoperative course will improve Outcome: Adequate for Discharge   Problem: Pain Management: Goal: General experience of comfort will improve Outcome: Adequate for Discharge   Problem: Skin Integrity: Goal: Demonstration of wound healing without infection will improve Outcome: Adequate for Discharge   Problem: Urinary Elimination: Goal: Ability to avoid or minimize complications of infection will improve Outcome: Adequate for Discharge Goal: Ability to achieve and maintain urine output will improve Outcome: Adequate for Discharge Goal: Home care management will improve Outcome: Adequate for Discharge   Problem: Clinical Measurements: Goal: Ability to maintain clinical measurements within normal limits will improve Outcome: Adequate for Discharge Goal: Will remain free from infection Outcome: Adequate for Discharge Goal: Diagnostic test results will improve Outcome: Adequate for Discharge Goal: Respiratory complications will improve Outcome: Adequate for Discharge Goal: Cardiovascular complication will be avoided Outcome: Adequate for Discharge   Problem: Activity: Goal: Risk for activity intolerance will decrease Outcome: Adequate for Discharge   Problem: Nutrition: Goal: Adequate nutrition will be maintained Outcome: Adequate for Discharge   Problem: Education: Goal: Knowledge of General Education information will improve Outcome: Adequate for Discharge

## 2018-04-10 NOTE — Discharge Summary (Signed)
[facility] Discharge Summary  Admit date: 04/09/2018  Discharge date and time: 04/10/18   Discharge to: Home  Discharge Service: Urology  Discharge Attending Physician: Dutch Gray, MD  Discharge Diagnoses: Prostate cancer  Secondary Diagnosis: Active Problems:   Prostate cancer [facility])   OR Procedures: Procedure(s): XI ROBOTIC ASSISTED LAPAROSCOPIC RADICAL PROSTATECTOMY LEVEL 3 LYMPHADENECTOMY 04/09/2018   Ancillary Procedures: None   Discharge Day Services: The patient was seen and examined by the Urology team both in the morning and immediately prior to discharge.  Vital signs and laboratory values were stable and within normal limits.  The physical exam was benign and unchanged and all surgical wounds were examined.  Discharge instructions were explained and all questions answered.  Subjective  No acute events overnight. Pain Controlled. No fever or chills.  Objective Patient Vitals for the past 8 hrs:  BP Temp Temp src Pulse Resp SpO2  04/10/18 1331 (!) 154/89 98.7 F (37.1 C) Oral 78 16 97 %  04/10/18 1004 (!) 149/96 98 F (36.7 C) Oral 77 (!) 24 98 %   Total I/O In: 400 [P.O.:400] Out: -   General Appearance:        No acute distress Lungs:                      Normal work of breathing on room air Heart:                                Regular rate and rhythm Abdomen:                         Soft, non-tender, non-distended, incisions c/d/i Extremities:                      Warm and well perfused   Hospital Course:  The patient underwent RALP with b/l PNLD on 04/09/2018.  The patient tolerated the procedure well, was extubated in the OR, and afterwards was taken to the PACU for routine post-surgical care. When stable the patient was transferred to the floor.   The patient did well postoperatively.  The patient's diet was slowly advanced and at the time of discharge was tolerating a regular diet.  The patient was discharged home 1 Day Post-Op, at which point was  tolerating a regular solid diet, was making adequate urine, having adequate pain control with P.O. pain medication, and could ambulate without difficulty. The patient will follow up with [place] for post op check and TOV.  Condition at Discharge: Improved  Discharge Medications:  Allergies as of 04/10/2018      Reactions   Eggs Or Egg-derived Products Nausea And Vomiting      Medication List    STOP taking these medications   meloxicam 15 MG tablet Commonly known as:  MOBIC   tamsulosin 0.4 MG Caps capsule Commonly known as:  FLOMAX     TAKE these medications   amLODipine 5 MG tablet Commonly known as:  NORVASC Take 1 tablet (5 mg total) by mouth daily.   benzonatate 200 MG capsule Commonly known as:  TESSALON Take 1 capsule (200 mg total) by mouth 2 (two) times daily as needed for cough.   gabapentin 300 MG capsule Commonly known as:  NEURONTIN Take 1 capsule (300 mg total) by mouth at bedtime.   methocarbamol 750 MG tablet Commonly known as:  ROBAXIN-750 Take 1  tablet (750 mg total) by mouth every 6 (six) hours as needed for muscle spasms.   sulfamethoxazole-trimethoprim 800-160 MG tablet Commonly known as:  BACTRIM DS,SEPTRA DS Take 1 tablet by mouth 2 (two) times daily. Start the day prior to foley removal appointment   traMADol 50 MG tablet Commonly known as:  ULTRAM Take 1-2 tablets (50-100 mg total) by mouth every 6 (six) hours as needed for moderate pain or severe pain. What changed:    how much to take  reasons to take this

## 2018-04-10 NOTE — Progress Notes (Signed)
Urology Progress Note   1 Day Post-Op  Subjective: NAEON AFVSS UOP adequate Drain output low Ambulating Tolerating CLD No flatus yet  Objective: Vital signs in last 24 hours: Temp:  [97.5 F (36.4 C)-99 F (37.2 C)] 99 F (37.2 C) (07/16 0530) Pulse Rate:  [79-102] 79 (07/16 0530) Resp:  [11-22] 17 (07/16 0530) BP: (120-161)/(62-88) 143/88 (07/16 0530) SpO2:  [89 %-99 %] 95 % (07/16 0530)  Intake/Output from previous day: 07/15 0701 - 07/16 0700 In: 6452.5 [P.O.:1320; I.V.:4982.5; IV Piggyback:150] Out: 6389 [Urine:2450; Drains:90; Blood:200] Intake/Output this shift: No intake/output data recorded.  Physical Exam:  General: Alert and oriented CV: RRR Lungs: Clear Abdomen: Soft, appropriately tender. Incisions c/d/i. JP drain with thin s/s output GU: Foley in place draining clear yellow urine Ext: NT, No erythema  Lab Results: Recent Labs    04/09/18 1150 04/10/18 0542  HGB 13.0 12.3*  HCT 40.6 38.6*   BMET No results for input(s): NA, K, CL, CO2, GLUCOSE, BUN, CREATININE, CALCIUM in the last 72 hours.   Studies/Results: No results found.  Assessment/Plan:  52 y.o. male POD# 1 s/p robotic prostatectomy. Patient doing well.  1) SL IVF 2) Ambulate, Incentive spirometry 3) Transition to oral pain medication 4) Dulcolax suppository 5) Discontinue JP drain 6) Plan for possible discharge later today   Dispo: Floor   LOS: 0 days   Case Rob Bunting 04/10/2018, 7:30 AM

## 2018-05-07 DIAGNOSIS — M6281 Muscle weakness (generalized): Secondary | ICD-10-CM | POA: Diagnosis not present

## 2018-05-07 DIAGNOSIS — N393 Stress incontinence (female) (male): Secondary | ICD-10-CM | POA: Diagnosis not present

## 2018-05-21 DIAGNOSIS — M6281 Muscle weakness (generalized): Secondary | ICD-10-CM | POA: Diagnosis not present

## 2018-05-21 DIAGNOSIS — N393 Stress incontinence (female) (male): Secondary | ICD-10-CM | POA: Diagnosis not present

## 2018-05-23 DIAGNOSIS — N393 Stress incontinence (female) (male): Secondary | ICD-10-CM | POA: Diagnosis not present

## 2018-05-23 DIAGNOSIS — R350 Frequency of micturition: Secondary | ICD-10-CM | POA: Diagnosis not present

## 2018-05-25 ENCOUNTER — Telehealth: Payer: Self-pay | Admitting: Family Medicine

## 2018-05-25 ENCOUNTER — Other Ambulatory Visit: Payer: Self-pay

## 2018-05-25 ENCOUNTER — Encounter: Payer: Self-pay | Admitting: Family Medicine

## 2018-05-25 ENCOUNTER — Ambulatory Visit: Payer: BLUE CROSS/BLUE SHIELD | Admitting: Family Medicine

## 2018-05-25 VITALS — BP 156/94 | HR 92 | Temp 98.2°F | Ht 68.5 in | Wt 310.0 lb

## 2018-05-25 DIAGNOSIS — C61 Malignant neoplasm of prostate: Secondary | ICD-10-CM | POA: Diagnosis not present

## 2018-05-25 DIAGNOSIS — M5442 Lumbago with sciatica, left side: Secondary | ICD-10-CM

## 2018-05-25 DIAGNOSIS — R739 Hyperglycemia, unspecified: Secondary | ICD-10-CM

## 2018-05-25 DIAGNOSIS — I10 Essential (primary) hypertension: Secondary | ICD-10-CM

## 2018-05-25 DIAGNOSIS — G8929 Other chronic pain: Secondary | ICD-10-CM

## 2018-05-25 MED ORDER — AMLODIPINE BESYLATE 5 MG PO TABS: 10.0000 mg | ORAL_TABLET | Freq: Every day | ORAL | 1 refills | Status: DC

## 2018-05-25 NOTE — Telephone Encounter (Signed)
Copied from [place] [id number]. Topic: Quick Communication - See Telephone Encounter >> May 25, 2018 12:34 PM Blase Mess A wrote: CRM for notification. See Telephone encounter for: 05/25/18. Patient has a question regarding amLODipine (NORVASC) 5 MG tablet [[id number]]   and doseage.  Please call [phone]

## 2018-05-25 NOTE — Patient Instructions (Addendum)
  Start amlodipine 1/2 tab (5mg ) once a day for a week, then increase to 1 tab (10mg ) once a day   If you have lab work done today you will be contacted with your lab results within the next 2 weeks.  If you have not heard from [place] then please contact us. The fastest way to get your results is to register for My Chart.   IF you received an x-ray today, you will receive an invoice from [facility]. Please contact [facility] at [phone] with questions or concerns regarding your invoice.   IF you received labwork today, you will receive an invoice from [place]. Please contact [facility] at [phone] with questions or concerns regarding your invoice.   Our billing staff will not be able to assist you with questions regarding bills from these companies.  You will be contacted with the lab results as soon as they are available. The fastest way to get your results is to activate your My Chart account. Instructions are located on the last page of this paperwork. If you have not heard from [place] regarding the results in 2 weeks, please contact this office.

## 2018-05-25 NOTE — Progress Notes (Signed)
8/30/20199:27 AM  Robert Mcintyre 09/28/1965, 52 y.o. male [id number]  Chief Complaint  Patient presents with  . Establish Care    having reoccuring back pain, need Norvasc refilled and Toviaz 54m    HPI:   Patient is a 52y.o. male with past medical history significant for HTN, prostate cancer, morbid obesity, who presents today to establish care  Previous PCP Dr YBlanchie ServeLast visit 06/2017  Patient Care Team: SRutherford Guys MD as PCP - General (Family Medicine) BRaynelle Bring MD as Consulting Physician (Urology) FDanie Binder MD as Consulting Physician (Gastroenterology) HCarole Civil MD as Consulting Physician (Orthopedic Surgery)  Prostate cancer 03/2018 radical prostatectomy and lymphadenopathy Feeling better, recovering well Sees urology next month for repeat PSA and next steps Just started toviaz  Has HTN diagnosed about 2 years ago Currently on amlodipine, ran out a while ago Reports that BP was never less than 140/90 while on amlodipine Denies any swelling  a1c in 07/2016 of 7.0, patient was not informed/aware  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 Status post L4-5 posterior decompression and interbody fusion. 2. Mild L5-S1 adjacent segment disease with small disc bulge and annular fissure. 3. No canal stenosis or neural foraminal narrowing any level. Had EMG done with neurology - patient reports normal EMG Has burning in low back after standing long periods of time, has numbness and tingling in left foot  Fall Risk  05/25/2018 07/28/2017 07/18/2016  Falls in the past year? No No No     [facility] 05/25/2018 07/28/2017 03/20/2017  Decreased Interest 0 0 0  Down, Depressed, Hopeless 0 0 0  PHQ - 2 Score 0 0 0    Allergies  Allergen Reactions  .  Eggs Or Egg-Derived Products Nausea And Vomiting    Prior to Admission medications   Medication Sig Start Date End Date Taking? Authorizing Provider  amLODipine (NORVASC) 5 MG tablet Take 1 tablet (5 mg total) by mouth daily. 03/20/17  Yes NRaylene Everts MD  fesoterodine (TOVIAZ) 4 MG TB24 tablet Take 4 mg by mouth daily.   Yes [provider]    Past Medical History:  Diagnosis Date  . Arthritis   . Asthma   . Hypertension   . [place]   . Periumbilical hernia    pt unaware  . [place]   . Spondylolysis, lumbar region     Past Surgical History:  Procedure Laterality Date  . COLONOSCOPY N/A 09/16/2016   Procedure: COLONOSCOPY;  Surgeon: SDanie Binder MD;  [facility];  Service: Endoscopy;  Laterality: N/A;  1:00 PM  . LYMPHADENECTOMY Bilateral 04/09/2018   Procedure: LYMPHADENECTOMY;  Surgeon: BRaynelle Bring MD;  Location: [facility];  Service: Urology;  Laterality: Bilateral;  . POLYPECTOMY  09/16/2016   Procedure: POLYPECTOMY;  Surgeon: SDanie Binder MD;  [facility];  Service: Endoscopy;;  ascending colon polyp, hepatic flexure polyp, descending colon polyp,   . ROBOT ASSISTED LAPAROSCOPIC RADICAL PROSTATECTOMY N/A 04/09/2018   Procedure: XI ROBOTIC ASSISTED LAPAROSCOPIC RADICAL PROSTATECTOMY LEVEL 3;  Surgeon: BRaynelle Bring MD;  [facility];  Service: Urology;  Laterality: N/A;  ONLY NEEDS 210 MIN FOR ALL PROCEDURES  . SPINE SURGERY  05/1997   back fusion    Social History  Tobacco Use  . Smoking status: Never Smoker  . Smokeless tobacco: Never Used  Substance Use Topics  . Alcohol use: Yes    Comment: occasional    Family History  Problem Relation Age of Onset  . Heart disease Mother   . Stroke Mother   . Hypertension Mother   . Diabetes Mother   . Asthma Father   . Hypertension Brother     Review of Systems  Constitutional: Negative for chills and fever.  Respiratory: Negative for cough  and shortness of breath.   Cardiovascular: Negative for chest pain, palpitations and leg swelling.  Gastrointestinal: Negative for abdominal pain, nausea and vomiting.  Genitourinary: Positive for frequency and urgency.  Musculoskeletal: Positive for back pain.  Neurological: Positive for tingling.     OBJECTIVE:  Blood pressure (!) 156/94, pulse 92, temperature 98.2 F (36.8 C), temperature source Oral, height 5' 8.5" (1.74 m), weight (!) 310 lb (140.6 kg), SpO2 95 %. Body mass index is 46.45 kg/m.   Physical Exam  Constitutional: He is oriented to person, place, and time. He appears well-developed and well-nourished.  HENT:  Head: Normocephalic and atraumatic.  Mouth/Throat: Oropharynx is clear and moist.  Eyes: Pupils are equal, round, and reactive to light. Conjunctivae and EOM are normal.  Neck: Neck supple.  Cardiovascular: Normal rate and regular rhythm. Exam reveals no gallop and no friction rub.  No murmur heard. Pulmonary/Chest: Effort normal and breath sounds normal. He has no wheezes. He has no rales.  Musculoskeletal: He exhibits no edema.  Neurological: He is alert and oriented to person, place, and time.  Skin: Skin is warm and dry.  Psychiatric: He has a normal mood and affect.  Nursing note and vitals reviewed.   ASSESSMENT and PLAN  1. Essential hypertension Uncontrolled in setting of being off meds. restarting meds today, discussed LFM. - Lipid panel - TSH - CMP14+EGFR - CBC  2. Morbid obesity ([place]) Discussed LFM, consider referral specially if DM - Lipid panel - TSH  3. Prostate cancer (HCC) Recent diagnosis, doing well from surgery, has upcoming appt with urology - CMP14+EGFR  4. Hyperglycemia Checking labs today, medications will be adjusted as needed.  - Lipid panel - TSH - Hemoglobin A1c  5. Chronic bilateral low back pain with left-sided sciatica Mri and emg reassuring. Discussed PT and weight loss - Ambulatory referral to Physical  Therapy  Other orders - amLODipine (NORVASC) 5 MG tablet; Take 2 tablets (10 mg total) by mouth daily.  Return in about 4 weeks (around 06/22/2018).    Rutherford Guys, MD Primary Care at [place], [place] [address] Ph.  [phone] Fax [phone]

## 2018-05-26 LAB — CBC
Hematocrit: 45.1 % (ref 37.5–51.0)
Hemoglobin: 14.4 g/dL (ref 13.0–17.7)
MCH: 29 pg (ref 26.6–33.0)
MCHC: 31.9 g/dL (ref 31.5–35.7)
MCV: 91 fL (ref 79–97)
Platelets: 336 10*3/uL (ref 150–450)
RBC: 4.96 x10E6/uL (ref 4.14–5.80)
RDW: 12.6 % (ref 12.3–15.4)
WBC: 7.5 10*3/uL (ref 3.4–10.8)

## 2018-05-26 LAB — CMP14+EGFR
ALT: 14 IU/L (ref 0–44)
AST: 13 IU/L (ref 0–40)
Albumin/Globulin Ratio: 1.3 (ref 1.2–2.2)
Albumin: 4.4 g/dL (ref 3.5–5.5)
Alkaline Phosphatase: 104 IU/L (ref 39–117)
BUN/Creatinine Ratio: 13 (ref 9–20)
BUN: 15 mg/dL (ref 6–24)
Bilirubin Total: 0.4 mg/dL (ref 0.0–1.2)
CO2: 23 mmol/L (ref 20–29)
Calcium: 9.5 mg/dL (ref 8.7–10.2)
Chloride: 100 mmol/L (ref 96–106)
Creatinine, Ser: 1.12 mg/dL (ref 0.76–1.27)
GFR calc Af Amer: 86 mL/min/{1.73_m2} (ref >59)
GFR calc non Af Amer: 74 mL/min/{1.73_m2} (ref >59)
Globulin, Total: 3.5 g/dL (ref 1.5–4.5)
Glucose: 123 mg/dL — ABNORMAL HIGH (ref 65–99)
Potassium: 4.2 mmol/L (ref 3.5–5.2)
Sodium: 141 mmol/L (ref 134–144)
Total Protein: 7.9 g/dL (ref 6.0–8.5)

## 2018-05-26 LAB — LIPID PANEL
Chol/HDL Ratio: 4.8 ratio (ref 0.0–5.0)
Cholesterol, Total: 190 mg/dL (ref 100–199)
HDL: 40 mg/dL (ref >39)
LDL Calculated: 137 mg/dL — ABNORMAL HIGH (ref 0–99)
Triglycerides: 65 mg/dL (ref 0–149)
VLDL Cholesterol Cal: 13 mg/dL (ref 5–40)

## 2018-05-26 LAB — TSH: TSH: 1.81 u[IU]/mL (ref 0.450–4.500)

## 2018-05-26 LAB — HEMOGLOBIN A1C
Est. average glucose Bld gHb Est-mCnc: 163 mg/dL
Hgb A1c MFr Bld: 7.3 % — ABNORMAL HIGH (ref 4.8–5.6)

## 2018-05-26 MED ORDER — METFORMIN HCL ER 500 MG PO TB24: 500.0000 mg | ORAL_TABLET | Freq: Every day | ORAL | 1 refills | Status: DC

## 2018-05-26 MED ORDER — AMLODIPINE BESYLATE 10 MG PO TABS: 10.0000 mg | ORAL_TABLET | Freq: Every day | ORAL | 1 refills | Status: DC

## 2018-05-26 NOTE — Telephone Encounter (Signed)
Dr. Pamella Pert spoke with the patient.

## 2018-05-30 DIAGNOSIS — R350 Frequency of micturition: Secondary | ICD-10-CM | POA: Diagnosis not present

## 2018-06-04 ENCOUNTER — Other Ambulatory Visit: Payer: Self-pay

## 2018-06-04 ENCOUNTER — Ambulatory Visit (HOSPITAL_COMMUNITY): Payer: BLUE CROSS/BLUE SHIELD | Attending: Family Medicine | Admitting: Physical Therapy

## 2018-06-04 DIAGNOSIS — M5416 Radiculopathy, lumbar region: Secondary | ICD-10-CM | POA: Insufficient documentation

## 2018-06-04 NOTE — Therapy (Signed)
[place], [place], [address] Phone: [phone]   Fax:  [phone]  Physical Therapy Evaluation  Patient Details  Name: Robert Mcintyre MRN: [id number] Date of Birth: 02/18/1966 Referring Provider: Grant Fontana    Encounter Date: 06/04/2018  PT End of Session - 06/04/18 1212    Visit Number  1    Number of Visits  8    Date for PT Re-Evaluation  07/04/18    Authorization - Visit Number  5    Authorization - Number of Visits  25    PT Start Time  2831    PT Stop Time  1208    PT Time Calculation (min)  44 min    Activity Tolerance  Patient tolerated treatment well    Behavior During Therapy  [facility] for tasks assessed/performed       Past Medical History:  Diagnosis Date  . Arthritis   . Asthma   . Hypertension   . Morbid obesity ([place])   . Periumbilical hernia    pt unaware  . Prostate cancer ([place])   . Spondylolysis, lumbar region     Past Surgical History:  Procedure Laterality Date  . COLONOSCOPY N/A 09/16/2016   Procedure: COLONOSCOPY;  Surgeon: Danie Binder, MD;  [facility];  Service: Endoscopy;  Laterality: N/A;  1:00 PM  . LYMPHADENECTOMY Bilateral 04/09/2018   Procedure: LYMPHADENECTOMY;  Surgeon: Raynelle Bring, MD;  [facility];  Service: Urology;  Laterality: Bilateral;  . POLYPECTOMY  09/16/2016   Procedure: POLYPECTOMY;  Surgeon: Danie Binder, MD;  [facility];  Service: Endoscopy;;  ascending colon polyp, hepatic flexure polyp, descending colon polyp,   . ROBOT ASSISTED LAPAROSCOPIC RADICAL PROSTATECTOMY N/A 04/09/2018   Procedure: XI ROBOTIC ASSISTED LAPAROSCOPIC RADICAL PROSTATECTOMY LEVEL 3;  Surgeon: Raynelle Bring, MD;  [facility];  Service: Urology;  Laterality: N/A;  ONLY NEEDS 210 MIN FOR ALL PROCEDURES  . SPINE SURGERY  05/1997   back fusion    There were no vitals filed for this visit.   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  He has had back surgery with L5-S1 fusion in September 1998.  He dis well for the first several years but in the past year he has has increased low back pain B with constant toe tingling in his left leg.  He has increased radiculopathy down both legs if he has overdone.      Pertinent History  1998 lumbar fusion, April 09 2018 prostate surgery.      Limitations  Sitting;Lifting;Standing;Walking    How long can you sit comfortably?  10-15 minutes     How long can you stand comfortably?  10-15 minutes     How long can you walk comfortably?  does not walk much     Patient Stated Goals  improved function, less pain     Currently in Pain?  Yes    Pain Score  4    worst 8/10; least 2/10   Pain Location  Back    Pain Orientation  Lower    Pain Descriptors / Indicators  Numbness    Pain Type  Chronic pain    Pain Radiating Towards  toes especially Left     Pain Onset  More than a month ago    Pain Frequency  Constant    Aggravating Factors  lifting     Pain Relieving Factors  lie down     Effect of Pain on Daily Activities  limites         [facility] PT Assessment - 06/04/18 0001      Assessment   Medical Diagnosis  B lumbar radiculopathy    Referring Provider  Grant Fontana     Onset Date/Surgical Date  05/28/17    Next MD Visit  07/11/2018    Prior Therapy  after fusion       Precautions   Precautions  Back      Restrictions   Weight Bearing Restrictions  No      Balance Screen   Has the patient fallen in the past 6 months  No    Has the patient had a decrease in activity level because of a fear of falling?   Yes    Is the patient reluctant to leave their home because of a fear of falling?   No      Home Environment   Living Environment  Private residence    Type of [place] Access  Stairs to enter   increased pain going up      Prior Function   Level of Independence  Independent    Vocation  Full time  employment    Vocation Requirements  truck driver     Leisure  none       Cognition   Overall Cognitive Status  Within Functional Limits for tasks assessed      Observation/Other Assessments   Focus on Therapeutic Outcomes (FOTO)   51      Functional Tests   Functional tests  Single leg stance;Sit to Stand      Single Leg Stance   Comments  Rt: 5 seconds; LT 20       Sit to Stand   Comments  5 x 11.06      ROM / Strength   AROM / PROM / Strength  AROM;Strength      AROM   AROM Assessment Site  Lumbar    Lumbar Flexion  finges 5 " from floor    reps increase sx with going down worse    Lumbar Extension  28   reps cause      Strength   Strength Assessment Site  Hip;Knee;Ankle    Right/Left Hip  Right;Left    Right Hip Extension  3/5    Right Hip ABduction  4+/5    Left Hip Extension  3/5    Left Hip ABduction  4+/5    Right/Left Knee  Right;Left    Right Knee Flexion  5/5    Right Knee Extension  4+/5    Left Knee Flexion  5/5    Left Knee Extension  5/5    Right/Left Ankle  Right;Left    Right Ankle Dorsiflexion  5/5    Left Ankle Dorsiflexion  5/5      Flexibility   Soft Tissue Assessment /Muscle Length  yes    Hamstrings  LT 150; RT:158                Objective measurements completed on examination: See above findings.      [facility] Adult PT Treatment/Exercise - 06/04/18 0001      Exercises   Exercises  Lumbar      Lumbar Exercises: Stretches   Standing Extension  5 reps      Lumbar Exercises: Seated  Other Seated Lumbar Exercises  Ab set, sitting tall x 5 each              PT Education - 06/04/18 1211    Education Details  HEP, the importance of sitting tall, walking and using proper body mechanics.  Order lumbar ice pack and lumbar roll from [employer].     Person(s) Educated  Patient    Methods  Explanation;Demonstration;Handout    Comprehension  Verbalized understanding;Returned demonstration       PT Short Term Goals -  06/04/18 1220      PT SHORT TERM GOAL #1   Title  Pt will be able to verbalize and demonstrate the importance of proper sitting to decrease stress off lumbar spine.     Time  2    Period  Weeks    Status  New    Target Date  06/18/18      PT SHORT TERM GOAL #2   Title  PT to be able to verbalize and demonstrate proper lifting techniques to decrease stress on low back.     Time  2    Period  Weeks    Status  New      PT SHORT TERM GOAL #3   Title  PT to state that he is no longer having constant tingling in his left foot to demonstrate decreased stress on lumbar nerves.    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     Time  4    Period  Weeks    Status  New    Target Date  07/02/18      PT LONG TERM GOAL #2   Title  PT gluteal maximus mm to be at least 4/5 B to allow pt to go up a flight of steps without any difficulty.     Time  4    Period  Weeks    Status  New      PT LONG TERM GOAL #3   Title  PT to be able to walk for 40 minutes with back pain no greater than 3/10 to allow pt to shop in relative comfort.    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  He has been referred to skilled physical therapy for treatment of his low back pain.  Evaluation demonstrates decreased mm strength, decreased balance, decreased flexibility, poor sitting posture and decreased knowledge of proper body mechanics.  Robert Mcintyre will benefit from skilled physical therapy to address these issues.  The patient has recently had surgery on his prostate  and is currently seeing a therapist in [employer] for pelvic floor strengthening .  He will see if the therapist that his is currently seeing feels comfortable treating his back issues as  well.      History and Personal Factors relevant to plan of care:  L5-S1 fusion, prostate cancer with recent surgery.     Clinical Presentation  Evolving    Clinical Decision Making  Moderate    Rehab Potential  Good    PT Frequency  2x / week    PT Duration  4 weeks    PT Treatment/Interventions  ADLs/Self Care Home Management;Manual techniques;Therapeutic exercise;Therapeutic activities;Balance training;Neuromuscular re-education;Functional mobility training    PT Next Visit Plan  begin lumbar stabilization to include, heel raises, functional squat, bridging, SL abduction, SL clam and prone hip extension.  Please give as HEP.  Progress functional stability.     PT Home Exercise Plan  sitting ab set, sitting tall as an exercise, back extension.     Consulted and Agree with Plan of Care  Patient       Patient will benefit from skilled therapeutic intervention in order to improve the following deficits and impairments:  Decreased activity tolerance, Decreased balance, Decreased knowledge of precautions, Decreased range of motion, Decreased strength, Difficulty walking, Improper body mechanics, Pain, Postural dysfunction  Visit Diagnosis: Radiculopathy, lumbar region - Plan: PT plan of care cert/re-cert     Problem List Patient Active Problem List   Diagnosis Date Noted  . Prostate cancer ([place]) 04/09/2018  . Essential hypertension 03/20/2017  . Tubular adenoma of colon 09/20/2016  . Morbid obesity ([place]) 07/18/2016  . 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    Rayetta Humphrey, PT CLT [phone] 06/04/2018, 12:28 PM  [place], [place], [address] Phone: [phone]   Fax:  [phone]  Name: Robert Mcintyre MRN:  [id number] Date of Birth: 12-Aug-1966

## 2018-07-05 DIAGNOSIS — C61 Malignant neoplasm of prostate: Secondary | ICD-10-CM | POA: Diagnosis not present

## 2018-07-11 DIAGNOSIS — C61 Malignant neoplasm of prostate: Secondary | ICD-10-CM | POA: Diagnosis not present

## 2018-07-11 DIAGNOSIS — N393 Stress incontinence (female) (male): Secondary | ICD-10-CM | POA: Diagnosis not present

## 2018-07-11 DIAGNOSIS — N5201 Erectile dysfunction due to arterial insufficiency: Secondary | ICD-10-CM | POA: Diagnosis not present

## 2018-08-28 ENCOUNTER — Other Ambulatory Visit: Payer: Self-pay | Admitting: Family Medicine

## 2018-08-31 MED ORDER — AMLODIPINE BESYLATE 10 MG PO TABS: 10.0000 mg | ORAL_TABLET | Freq: Every day | ORAL | 0 refills | Status: DC

## 2018-08-31 NOTE — Telephone Encounter (Signed)
[place] called and spoke to [employer], Merchant navy officer. I advised the patient wanted Amlodipine 10 mg in stead of taking 2 of the 5 mg that was sent in on 08/28/18. I advised a 10 mg tab is listed on his profile and says it was sent on 05/26/18 #90/1 refill. Dawn says the 10 mg Amlodipine is not showing on his profile at the pharmacy, only the 5 mg, which he has not picked up. I asked if she will cancel the 5 mg out of his profile and I will submit the Amlodipine 10 mg, she verbalized understanding.

## 2018-08-31 NOTE — Addendum Note (Signed)
Addended by: Matilde Sprang on: 08/31/2018 10:15 AM   Modules accepted: Orders

## 2018-08-31 NOTE — Telephone Encounter (Signed)
Pt called back and stated that he would like to take one 10 mg tablet instead of two 5mg  tables. Please advise

## 2018-10-16 DIAGNOSIS — E119 Type 2 diabetes mellitus without complications: Secondary | ICD-10-CM | POA: Diagnosis not present

## 2018-10-16 DIAGNOSIS — X58XXXA Exposure to other specified factors, initial encounter: Secondary | ICD-10-CM | POA: Diagnosis not present

## 2018-10-16 DIAGNOSIS — H5712 Ocular pain, left eye: Secondary | ICD-10-CM | POA: Diagnosis not present

## 2018-10-16 DIAGNOSIS — Z79899 Other long term (current) drug therapy: Secondary | ICD-10-CM | POA: Diagnosis not present

## 2018-10-16 DIAGNOSIS — I10 Essential (primary) hypertension: Secondary | ICD-10-CM | POA: Diagnosis not present

## 2018-10-16 DIAGNOSIS — S0502XA Injury of conjunctiva and corneal abrasion without foreign body, left eye, initial encounter: Secondary | ICD-10-CM | POA: Diagnosis not present

## 2018-10-16 DIAGNOSIS — Z7984 Long term (current) use of oral hypoglycemic drugs: Secondary | ICD-10-CM | POA: Diagnosis not present

## 2018-10-17 DIAGNOSIS — H10013 Acute follicular conjunctivitis, bilateral: Secondary | ICD-10-CM | POA: Diagnosis not present

## 2018-10-26 ENCOUNTER — Encounter (HOSPITAL_COMMUNITY): Payer: Self-pay | Admitting: Physical Therapy

## 2018-10-26 NOTE — Therapy (Signed)
[place], [place], [address] Phone: [phone]   Fax:  [phone]  Patient Details  Name: Robert Mcintyre MRN: [id number] Date of Birth: 26-Oct-1965 Referring Provider:  No ref. provider found  Encounter Date: 10/26/2018   PHYSICAL THERAPY DISCHARGE SUMMARY  Visits from Start of Care: 1 Current functional level related to goals / functional outcomes: Unknown    Remaining deficits: unknown   Education / Equipment: HEP Plan: Patient agrees to discharge.  Patient goals were not met. Patient is being discharged due to not returning since the last visit.  ?????    Seen for evaluation only Rayetta Humphrey, [place] CLT [phone] 10/26/2018, 4:17 PM  [place] [address] [place], [place], [address] Phone: [phone]   Fax:  [phone]

## 2018-12-14 DIAGNOSIS — H10013 Acute follicular conjunctivitis, bilateral: Secondary | ICD-10-CM | POA: Diagnosis not present

## 2019-01-02 ENCOUNTER — Telehealth: Payer: Self-pay | Admitting: Family Medicine

## 2019-01-02 ENCOUNTER — Other Ambulatory Visit: Payer: Self-pay

## 2019-01-02 DIAGNOSIS — I10 Essential (primary) hypertension: Secondary | ICD-10-CM

## 2019-01-02 DIAGNOSIS — R030 Elevated blood-pressure reading, without diagnosis of hypertension: Secondary | ICD-10-CM

## 2019-01-02 MED ORDER — AMLODIPINE BESYLATE 10 MG PO TABS
10.0000 mg | ORAL_TABLET | Freq: Every day | ORAL | 0 refills | Status: DC
Start: 2019-01-02 — End: 2019-01-25

## 2019-01-02 MED ORDER — METFORMIN HCL ER 500 MG PO TB24: 500.0000 mg | ORAL_TABLET | Freq: Every day | ORAL | 0 refills | Status: DC

## 2019-01-02 NOTE — Telephone Encounter (Signed)
Copied from [place] [id number]. Topic: Quick Communication - Rx Refill/Question >> Jan 02, 2019  2:30 PM Izola Price, [place] A wrote: Medication: metFORMIN (GLUCOPHAGE XR) 500 MG 24 hr tablet, amLODipine (NORVASC) 10 MG tablet   Has the patient contacted their pharmacy? Yes (Agent: If no, request that the patient contact the pharmacy for the refill.) (Agent: If yes, when and what did the pharmacy advise?)Contact PCP  Preferred Pharmacy (with phone number or street name): [facility] [address], [place] [phone] (Phone) [phone] (Fax)    Agent: Please be advised that RX refills may take up to 3 business days. We ask that you follow-up with your pharmacy.

## 2019-01-02 NOTE — Telephone Encounter (Signed)
Rx sent to pharmacy   

## 2019-01-24 ENCOUNTER — Ambulatory Visit: Payer: BLUE CROSS/BLUE SHIELD

## 2019-01-25 ENCOUNTER — Other Ambulatory Visit: Payer: Self-pay

## 2019-01-25 ENCOUNTER — Telehealth (INDEPENDENT_AMBULATORY_CARE_PROVIDER_SITE_OTHER): Payer: BLUE CROSS/BLUE SHIELD | Admitting: Family Medicine

## 2019-01-25 DIAGNOSIS — I10 Essential (primary) hypertension: Secondary | ICD-10-CM

## 2019-01-25 DIAGNOSIS — C61 Malignant neoplasm of prostate: Secondary | ICD-10-CM

## 2019-01-25 DIAGNOSIS — M4726 Other spondylosis with radiculopathy, lumbar region: Secondary | ICD-10-CM

## 2019-01-25 DIAGNOSIS — E119 Type 2 diabetes mellitus without complications: Secondary | ICD-10-CM

## 2019-01-25 DIAGNOSIS — E1165 Type 2 diabetes mellitus with hyperglycemia: Secondary | ICD-10-CM | POA: Insufficient documentation

## 2019-01-25 MED ORDER — AMLODIPINE BESYLATE 10 MG PO TABS: 10.0000 mg | ORAL_TABLET | Freq: Every day | ORAL | 1 refills | Status: DC

## 2019-01-25 MED ORDER — METFORMIN HCL ER 500 MG PO TB24: 500.0000 mg | ORAL_TABLET | Freq: Every day | ORAL | 0 refills | Status: DC

## 2019-01-25 NOTE — Progress Notes (Signed)
pt is needing refills on pended mediation and a recheck on a1c for DOT

## 2019-01-25 NOTE — Progress Notes (Signed)
Virtual Visit Note  I connected with patient on 01/25/19 at 209pm by phone and verified that I am speaking with the correct person using two identifiers. Robert Mcintyre is currently located driving and patient is currently with them during visit. The provider, Rutherford Guys, MD is located in their office at time of visit.  I discussed the limitations, risks, security and privacy concerns of performing an evaluation and management service by telephone and the availability of in person appointments. I also discussed with the patient that there may be a patient responsible charge related to this service. The patient expressed understanding and agreed to proceed.   CC: medication refill  HPI ? Patient is a 53 y.o. male with past medical history significant for HTN,  prostate cancer, morbid obesity, diabetes, DDD s/p laminectomy, colonic polyps who presents for followup  Last OV Aug 2019 Increased amlodipine 10mg  and started metformin XR 500mg   Does not check BP nor cbgs at home Tolerating medications well  Thinks he has gained about 10-15 lbs since he has been out of work due to covid19 Denies any increase in thirist or urination Denies any chest pain, SOB, edema, nausea or vomiting  Continues to see urologist for surveillance of prostate cancer  Has chronic numbness of right toes from his back, however no changes Overall back pain is stable, intermittent   Allergies  Allergen Reactions  . Eggs Or Egg-Derived Products Nausea And Vomiting    Prior to Admission medications   Medication Sig Start Date End Date Taking? Authorizing Provider  amLODipine (NORVASC) 10 MG tablet Take 1 tablet (10 mg total) by mouth daily. 01/02/19   Rutherford Guys, MD  metFORMIN (GLUCOPHAGE XR) 500 MG 24 hr tablet Take 1 tablet (500 mg total) by mouth daily with breakfast. 01/02/19   Rutherford Guys, MD    Past Medical History:  Diagnosis Date  . Arthritis   . Asthma   . Hypertension   . Morbid  obesity ([place])   . Periumbilical hernia    pt unaware  . Prostate cancer ([place])   . Spondylolysis, lumbar region     Past Surgical History:  Procedure Laterality Date  . COLONOSCOPY N/A 09/16/2016   Procedure: COLONOSCOPY;  Surgeon: Danie Binder, MD;  [facility];  Service: Endoscopy;  Laterality: N/A;  1:00 PM  . LYMPHADENECTOMY Bilateral 04/09/2018   Procedure: LYMPHADENECTOMY;  Surgeon: Raynelle Bring, MD;  [facility];  Service: Urology;  Laterality: Bilateral;  . POLYPECTOMY  09/16/2016   Procedure: POLYPECTOMY;  Surgeon: Danie Binder, MD;  [facility];  Service: Endoscopy;;  ascending colon polyp, hepatic flexure polyp, descending colon polyp,   . ROBOT ASSISTED LAPAROSCOPIC RADICAL PROSTATECTOMY N/A 04/09/2018   Procedure: XI ROBOTIC ASSISTED LAPAROSCOPIC RADICAL PROSTATECTOMY LEVEL 3;  Surgeon: Raynelle Bring, MD;  [facility];  Service: Urology;  Laterality: N/A;  ONLY NEEDS 210 MIN FOR ALL PROCEDURES  . SPINE SURGERY  05/1997   back fusion    Social History   Tobacco Use  . Smoking status: Never Smoker  . Smokeless tobacco: Never Used  Substance Use Topics  . Alcohol use: Yes    Comment: occasional    Family History  Problem Relation Age of Onset  . Heart disease Mother   . Stroke Mother   . Hypertension Mother   . Diabetes Mother   . Asthma Father   . Hypertension Brother     ROS Per hpi  Objective  Vitals as reported by the patient: none   ASSESSMENT and PLAN  1. Essential hypertension - Lipid panel; Future - CBC; Future - amLODipine (NORVASC) 10 MG tablet; Take 1 tablet (10 mg total) by mouth daily.  2. Type 2 diabetes mellitus without complication, without long-term current use of insulin (HCC) - Hemoglobin A1c; Future - Comprehensive metabolic panel; Future - Lipid panel; Future - TSH; Future - Microalbumin / creatinine urine ratio; Future - metFORMIN (GLUCOPHAGE XR) 500 MG 24 hr tablet; Take 1 tablet (500  mg total) by mouth daily with breakfast.  3. Prostate cancer ([place]) Managed by urology, has appt next week  4. Osteoarthritis of spine with radiculopathy, lumbar region Stable.   FOLLOW-UP: labs within 1 week, followup 2 weeks.    The above assessment and management plan was discussed with the patient. The patient verbalized understanding of and has agreed to the management plan. Patient is aware to call the clinic if symptoms persist or worsen. Patient is aware when to return to the clinic for a follow-up visit. Patient educated on when it is appropriate to go to the emergency department.    I provided 15 minutes of non-face-to-face time during this encounter.  Rutherford Guys, MD Primary Care at [facility] [place], [place] [address] Ph.  [phone] Fax [phone]

## 2019-01-29 ENCOUNTER — Other Ambulatory Visit: Payer: Self-pay

## 2019-01-29 ENCOUNTER — Ambulatory Visit (INDEPENDENT_AMBULATORY_CARE_PROVIDER_SITE_OTHER): Payer: BLUE CROSS/BLUE SHIELD | Admitting: Family Medicine

## 2019-01-29 DIAGNOSIS — I10 Essential (primary) hypertension: Secondary | ICD-10-CM | POA: Diagnosis not present

## 2019-01-29 DIAGNOSIS — E119 Type 2 diabetes mellitus without complications: Secondary | ICD-10-CM

## 2019-01-29 NOTE — Patient Instructions (Signed)
° ° ° °  If you have lab work done today you will be contacted with your lab results within the next 2 weeks.  If you have not heard from us then please contact us. The fastest way to get your results is to register for My Chart. ° ° °IF you received an x-ray today, you will receive an invoice from [facility]. Please contact [facility] at [phone] with questions or concerns regarding your invoice.  ° °IF you received labwork today, you will receive an invoice from [facility]. Please contact [facility] at [phone] with questions or concerns regarding your invoice.  ° °Our billing staff will not be able to assist you with questions regarding bills from these companies. ° °You will be contacted with the lab results as soon as they are available. The fastest way to get your results is to activate your My Chart account. Instructions are located on the last page of this paperwork. If you have not heard from us regarding the results in 2 weeks, please contact this office. °  ° ° ° °

## 2019-01-30 DIAGNOSIS — C61 Malignant neoplasm of prostate: Secondary | ICD-10-CM | POA: Diagnosis not present

## 2019-01-30 DIAGNOSIS — N5201 Erectile dysfunction due to arterial insufficiency: Secondary | ICD-10-CM | POA: Diagnosis not present

## 2019-01-30 LAB — COMPREHENSIVE METABOLIC PANEL
ALT: 14 IU/L (ref 0–44)
AST: 14 IU/L (ref 0–40)
Albumin/Globulin Ratio: 1.3 (ref 1.2–2.2)
Albumin: 4.3 g/dL (ref 3.8–4.9)
Alkaline Phosphatase: 97 IU/L (ref 39–117)
BUN/Creatinine Ratio: 12 (ref 9–20)
BUN: 14 mg/dL (ref 6–24)
Bilirubin Total: 0.6 mg/dL (ref 0.0–1.2)
CO2: 25 mmol/L (ref 20–29)
Calcium: 9.8 mg/dL (ref 8.7–10.2)
Chloride: 99 mmol/L (ref 96–106)
Creatinine, Ser: 1.19 mg/dL (ref 0.76–1.27)
GFR calc Af Amer: 79 mL/min/{1.73_m2} (ref >59)
GFR calc non Af Amer: 68 mL/min/{1.73_m2} (ref >59)
Globulin, Total: 3.2 g/dL (ref 1.5–4.5)
Glucose: 141 mg/dL — ABNORMAL HIGH (ref 65–99)
Potassium: 4.4 mmol/L (ref 3.5–5.2)
Sodium: 139 mmol/L (ref 134–144)
Total Protein: 7.5 g/dL (ref 6.0–8.5)

## 2019-01-30 LAB — MICROALBUMIN / CREATININE URINE RATIO
Creatinine, Urine: 127.8 mg/dL
Microalb/Creat Ratio: <2 mg/g creat (ref 0–29)
Microalbumin, Urine: <3 ug/mL

## 2019-01-30 LAB — CBC
Hematocrit: 44.6 % (ref 37.5–51.0)
Hemoglobin: 15 g/dL (ref 13.0–17.7)
MCH: 31.1 pg (ref 26.6–33.0)
MCHC: 33.6 g/dL (ref 31.5–35.7)
MCV: 93 fL (ref 79–97)
Platelets: 327 10*3/uL (ref 150–450)
RBC: 4.82 x10E6/uL (ref 4.14–5.80)
RDW: 12.6 % (ref 11.6–15.4)
WBC: 7.8 10*3/uL (ref 3.4–10.8)

## 2019-01-30 LAB — LIPID PANEL
Chol/HDL Ratio: 4.6 ratio (ref 0.0–5.0)
Cholesterol, Total: 172 mg/dL (ref 100–199)
HDL: 37 mg/dL — ABNORMAL LOW (ref >39)
LDL Calculated: 120 mg/dL — ABNORMAL HIGH (ref 0–99)
Triglycerides: 74 mg/dL (ref 0–149)
VLDL Cholesterol Cal: 15 mg/dL (ref 5–40)

## 2019-01-30 LAB — HEMOGLOBIN A1C
Est. average glucose Bld gHb Est-mCnc: 166 mg/dL
Hgb A1c MFr Bld: 7.4 % — ABNORMAL HIGH (ref 4.8–5.6)

## 2019-01-30 LAB — TSH: TSH: 1.59 u[IU]/mL (ref 0.450–4.500)

## 2019-02-08 ENCOUNTER — Telehealth: Payer: BLUE CROSS/BLUE SHIELD | Admitting: Family Medicine

## 2019-02-15 ENCOUNTER — Other Ambulatory Visit: Payer: Self-pay

## 2019-02-15 ENCOUNTER — Telehealth (INDEPENDENT_AMBULATORY_CARE_PROVIDER_SITE_OTHER): Payer: BLUE CROSS/BLUE SHIELD | Admitting: Family Medicine

## 2019-02-15 DIAGNOSIS — I10 Essential (primary) hypertension: Secondary | ICD-10-CM

## 2019-02-15 DIAGNOSIS — G8929 Other chronic pain: Secondary | ICD-10-CM

## 2019-02-15 DIAGNOSIS — E119 Type 2 diabetes mellitus without complications: Secondary | ICD-10-CM | POA: Diagnosis not present

## 2019-02-15 DIAGNOSIS — M25512 Pain in left shoulder: Secondary | ICD-10-CM

## 2019-02-15 DIAGNOSIS — E7800 Pure hypercholesterolemia, unspecified: Secondary | ICD-10-CM

## 2019-02-15 MED ORDER — AMLODIPINE BESYLATE 10 MG PO TABS: 10.0000 mg | ORAL_TABLET | Freq: Every day | ORAL | 1 refills | Status: DC

## 2019-02-15 MED ORDER — ROSUVASTATIN CALCIUM 5 MG PO TABS: 5.0000 mg | ORAL_TABLET | Freq: Every day | ORAL | 3 refills | Status: DC

## 2019-02-15 MED ORDER — METFORMIN HCL ER 500 MG PO TB24: 1000.0000 mg | ORAL_TABLET | Freq: Every day | ORAL | 1 refills | Status: DC

## 2019-02-15 NOTE — Progress Notes (Signed)
Virtual Visit Note  I connected with patient on 02/15/19 at 1102 am by phone and verified that I am speaking with the correct person using two identifiers. Robert Mcintyre is currently located at home and patient is currently with them during visit. The provider, Rutherford Guys, MD is located in their office at time of visit.  I discussed the limitations, risks, security and privacy concerns of performing an evaluation and management service by telephone and the availability of in person appointments. I also discussed with the patient that there may be a patient responsible charge related to this service. The patient expressed understanding and agreed to proceed.   CC: followup on labs and BP  HPI ? Patient is a53 y.o.malewith past medical history significant for HTN,  prostate cancer, morbid obesity, diabetes, DDD s/p laminectomy, colonic polyps who presents for followup  Patient is overall doing well Having intermittent left shoulder achiness when he sleeps on it. Notices he cant get comfortable on his left side He denies any problems during the day, any decrease in ROM, weakness, numbness or tingling Takes APAP prn and uses heat, these help  Lab Results  Component Value Date   HGBA1C 7.4 (H) 01/29/2019   Lab Results  Component Value Date   CHOL 172 01/29/2019   HDL 37 (L) 01/29/2019   LDLCALC 120 (H) 01/29/2019   TRIG 74 01/29/2019   CHOLHDL 4.6 01/29/2019   Lab Results  Component Value Date   CREATININE 1.19 01/29/2019   BUN 14 01/29/2019   NA 139 01/29/2019   K 4.4 01/29/2019   CL 99 01/29/2019   CO2 25 01/29/2019   Lab Results  Component Value Date   ALT 14 01/29/2019   AST 14 01/29/2019   ALKPHOS 97 01/29/2019   BILITOT 0.6 01/29/2019   Lab Results  Component Value Date   TSH 1.590 01/29/2019   Lab Results  Component Value Date   WBC 7.8 01/29/2019   HGB 15.0 01/29/2019   HCT 44.6 01/29/2019   MCV 93 01/29/2019   PLT 327 01/29/2019   Urine  microalbumine/creatinine ratio: < 30  BP Readings from Last 3 Encounters:  01/29/19 135/73  05/25/18 (!) 156/94  04/10/18 (!) 154/89   The 10-year ASCVD risk score Mikey Bussing DC Jr., et al., 2013) is: 22.7%   Values used to calculate the score:     Age: 53 years     Sex: Male     Is Non-Hispanic African American: Yes     Diabetic: Yes     Tobacco smoker: No     Systolic Blood Pressure: 017 mmHg     Is BP treated: Yes     HDL Cholesterol: 37 mg/dL     Total Cholesterol: 172 mg/dL  Allergies  Allergen Reactions   Eggs Or Egg-Derived Products Nausea And Vomiting    Prior to Admission medications   Medication Sig Start Date End Date Taking? Authorizing Provider  amLODipine (NORVASC) 10 MG tablet Take 1 tablet (10 mg total) by mouth daily. 01/25/19   Rutherford Guys, MD  metFORMIN (GLUCOPHAGE XR) 500 MG 24 hr tablet Take 1 tablet (500 mg total) by mouth daily with breakfast. 01/25/19   Rutherford Guys, MD    Past Medical History:  Diagnosis Date   Arthritis    Asthma    Hypertension    Morbid obesity ([place])    Periumbilical hernia    pt unaware   Prostate cancer ([place])    Spondylolysis, lumbar  region     Past Surgical History:  Procedure Laterality Date   COLONOSCOPY N/A 09/16/2016   Procedure: COLONOSCOPY;  Surgeon: Danie Binder, MD;  [facility];  Service: Endoscopy;  Laterality: N/A;  1:00 PM   LYMPHADENECTOMY Bilateral 04/09/2018   Procedure: LYMPHADENECTOMY;  Surgeon: Raynelle Bring, MD;  [facility];  Service: Urology;  Laterality: Bilateral;   POLYPECTOMY  09/16/2016   Procedure: POLYPECTOMY;  Surgeon: Danie Binder, MD;  [facility];  Service: Endoscopy;;  ascending colon polyp, hepatic flexure polyp, descending colon polyp,    ROBOT ASSISTED LAPAROSCOPIC RADICAL PROSTATECTOMY N/A 04/09/2018   Procedure: XI ROBOTIC ASSISTED LAPAROSCOPIC RADICAL PROSTATECTOMY LEVEL 3;  Surgeon: Raynelle Bring, MD;  [facility];  Service:  Urology;  Laterality: N/A;  ONLY NEEDS 210 MIN FOR ALL PROCEDURES   SPINE SURGERY  05/1997   back fusion    Social History   Tobacco Use   Smoking status: Never Smoker   Smokeless tobacco: Never Used  Substance Use Topics   Alcohol use: Yes    Comment: occasional    Family History  Problem Relation Age of Onset   Heart disease Mother    Stroke Mother    Hypertension Mother    Diabetes Mother    Asthma Father    Hypertension Brother     ROS Per hpi  Objective  Vitals as reported by the patient: none   ASSESSMENT and PLAN  1. Type 2 diabetes mellitus without complication, without long-term current use of insulin (HCC) Uncontrolled. Increasing metformin. Discussed LFM. - metFORMIN (GLUCOPHAGE XR) 500 MG 24 hr tablet; Take 2 tablets (1,000 mg total) by mouth daily with breakfast. - Hemoglobin A1c; Future  2. Essential hypertension Controlled. Continue current regime.  - amLODipine (NORVASC) 10 MG tablet; Take 1 tablet (10 mg total) by mouth daily.  3. Pure hypercholesterolemia Uncontrolled. Staring crestor, reviewed r/se/b - Lipid panel; Future - Comprehensive metabolic panel; Future  4. Chronic left shoulder pain Mild, intermittent, cont with conservative measures  Other orders - rosuvastatin (CRESTOR) 5 MG tablet; Take 1 tablet (5 mg total) by mouth at bedtime.  FOLLOW-UP: 3 months   The above assessment and management plan was discussed with the patient. The patient verbalized understanding of and has agreed to the management plan. Patient is aware to call the clinic if symptoms persist or worsen. Patient is aware when to return to the clinic for a follow-up visit. Patient educated on when it is appropriate to go to the emergency department.    I provided 18 minutes of non-face-to-face time during this encounter.  Rutherford Guys, MD Primary Care at [place], [place] [address] Ph.  [phone] Fax [phone]

## 2019-02-15 NOTE — Progress Notes (Signed)
Hypertension and diabetes follow up, also will be discussing the lab results from 01/29/19. He is also having pain in the left shoulder for more than two months now. He is using otc tylenol and heat for the pain when it becomes unbearable. Pain is causing him not to be able to sleep. He is having no issue with anxiety or depression. No refills need on any medication at this time.

## 2019-02-19 ENCOUNTER — Telehealth: Payer: Self-pay | Admitting: Family Medicine

## 2019-02-19 NOTE — Telephone Encounter (Signed)
Called pt to reschedule 2 month follow up end of July. Pt will be returning to work and needs to check his schedule first. Pt also knows to schedule labs 3-4 days before office visit.  FR

## 2019-04-22 IMAGING — NM NM BONE WHOLE BODY
4 series · 4 of 4 positions shown · non-contrast
Comparison: None.

CLINICAL DATA: Prostate carcinoma

EXAM:
NUCLEAR MEDICINE WHOLE BODY BONE SCAN
TECHNIQUE: Whole body anterior and posterior images were obtained approximately
3 hours after intravenous injection of radiopharmaceutical.
RADIOPHARMACEUTICALS:  21.6 mCi Yechnetium-LLm MDP IV

[Series 1: wbr_bone_60 whole body · 2.66mm/px · 1 of 1 slices shown (1 of 2)]
[im 1/1]
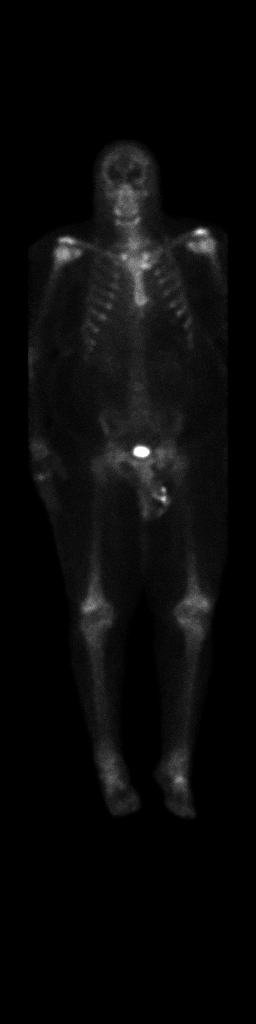

[Series 1: whole body · 2.66mm/px · 1 of 1 slices shown (1 of 2)]
[im 1/1  full-range]
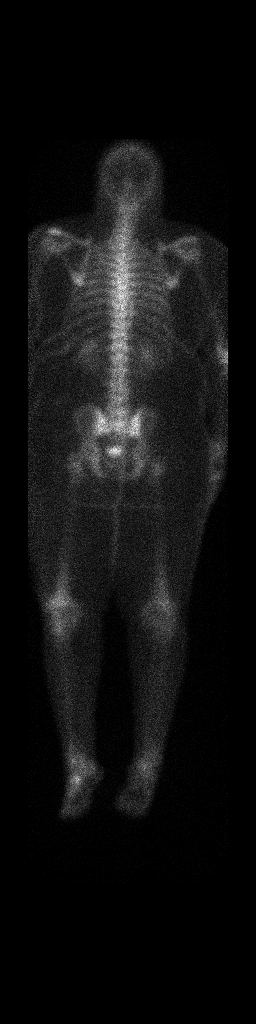

[Series 1: wbr_bone_60 whole body · 2.66mm/px · 1 of 1 slices shown (2 of 2)]
[im 1/1  full-range]
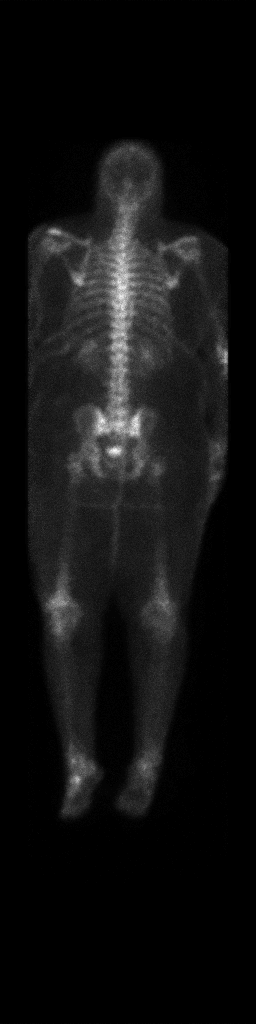

[Series 1: whole body · 2.66mm/px · 1 of 1 slices shown (2 of 2)]
[im 1/1]
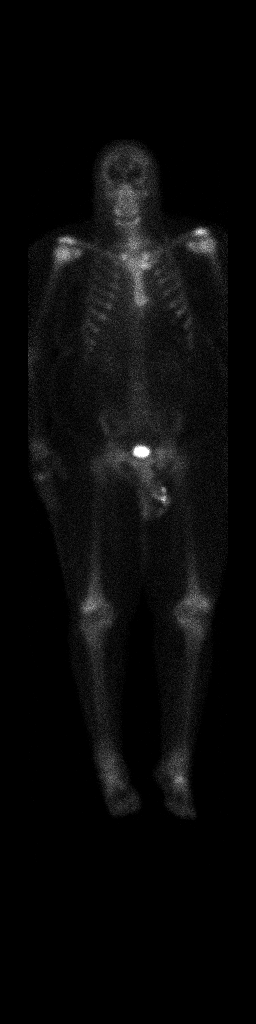

[4 of 4 positions shown; findings below may reference images not displayed]

FINDINGS: Areas of increased uptake are noted in both shoulders, hips, knees,
ankles, consistent with arthropathic change. There is no abnormal
radiotracer uptake suggesting bony metastatic disease. Increased
uptake in the mandibular regions is likely of dental etiology.
Kidneys are noted in the flank positions bilaterally.
IMPRESSION: Arthropathy in multiple major joints noted. Probable dental disease
accounting for areas of increased uptake in the perimandibular
regions. No findings felt to be indicative of bony metastatic
disease. Kidneys in flank positions bilaterally.

## 2019-06-13 ENCOUNTER — Ambulatory Visit (INDEPENDENT_AMBULATORY_CARE_PROVIDER_SITE_OTHER): Payer: BC Managed Care – PPO | Admitting: Family Medicine

## 2019-06-13 ENCOUNTER — Encounter: Payer: Self-pay | Admitting: Family Medicine

## 2019-06-13 ENCOUNTER — Other Ambulatory Visit: Payer: Self-pay

## 2019-06-13 VITALS — BP 138/88 | HR 100 | Temp 98.6°F | Ht 68.5 in | Wt 305.0 lb

## 2019-06-13 DIAGNOSIS — I10 Essential (primary) hypertension: Secondary | ICD-10-CM | POA: Diagnosis not present

## 2019-06-13 DIAGNOSIS — E7800 Pure hypercholesterolemia, unspecified: Secondary | ICD-10-CM

## 2019-06-13 DIAGNOSIS — E1165 Type 2 diabetes mellitus with hyperglycemia: Secondary | ICD-10-CM | POA: Diagnosis not present

## 2019-06-13 DIAGNOSIS — E119 Type 2 diabetes mellitus without complications: Secondary | ICD-10-CM | POA: Diagnosis not present

## 2019-06-13 DIAGNOSIS — Z23 Encounter for immunization: Secondary | ICD-10-CM | POA: Diagnosis not present

## 2019-06-13 LAB — POCT GLYCOSYLATED HEMOGLOBIN (HGB A1C): Hemoglobin A1C: 11 % — AB (ref 4.0–5.6)

## 2019-06-13 MED ORDER — TRULICITY 0.75 MG/0.5ML ~~LOC~~ SOAJ: 0.7500 mg | SUBCUTANEOUS | 5 refills | Status: DC

## 2019-06-13 MED ORDER — BLOOD GLUCOSE METER KIT: PACK | 11 refills | Status: AC

## 2019-06-13 NOTE — Patient Instructions (Signed)
° ° ° °  If you have lab work done today you will be contacted with your lab results within the next 2 weeks.  If you have not heard from us then please contact us. The fastest way to get your results is to register for My Chart. ° ° °IF you received an x-ray today, you will receive an invoice from [facility]. Please contact [facility] at [phone] with questions or concerns regarding your invoice.  ° °IF you received labwork today, you will receive an invoice from [facility]. Please contact [facility] at [phone] with questions or concerns regarding your invoice.  ° °Our billing staff will not be able to assist you with questions regarding bills from these companies. ° °You will be contacted with the lab results as soon as they are available. The fastest way to get your results is to activate your My Chart account. Instructions are located on the last page of this paperwork. If you have not heard from us regarding the results in 2 weeks, please contact this office. °  ° ° ° °

## 2019-06-13 NOTE — Progress Notes (Signed)
9/17/20209:56 AM  Robert Mcintyre [birth year], 53 y.o., male [id number]  Chief Complaint  Patient presents with  . Hypertension    wants to talk discuss if metformin effects kidneys    HPI:   Patient is a 53 y.o. male with past medical history significant for HTN, prostate cancer, morbid obesity, diabetes, DDD s/p laminectomy, colonic polypswho presents for followup  Last OV May - telemedicine Increased metformin, started crestor He works driving truck, long hours, using energy drinks Cutting backs on breads, smaller portions Tolerating higher dose of metformin Tolerating crestor well  Lab Results  Component Value Date   HGBA1C 7.4 (H) 01/29/2019   HGBA1C 7.3 (H) 05/25/2018   HGBA1C 7.0 (H) 08/11/2016   Lab Results  Component Value Date   LDLCALC 120 (H) 01/29/2019   CREATININE 1.19 01/29/2019   Depression screen PHQ 2/9 06/13/2019 02/15/2019 01/25/2019  Decreased Interest 0 0 0  Down, Depressed, Hopeless 0 0 0  PHQ - 2 Score 0 0 0    Fall Risk  06/13/2019 02/15/2019 01/25/2019 01/25/2019 05/25/2018  Falls in the past year? 0 0 0 0 No  Number falls in past yr: 0 0 0 0 -  Injury with Fall? 0 0 0 0 -     Allergies  Allergen Reactions  . Eggs Or Egg-Derived Products Nausea And Vomiting    Prior to Admission medications   Medication Sig Start Date End Date Taking? Authorizing Provider  amLODipine (NORVASC) 10 MG tablet Take 1 tablet (10 mg total) by mouth daily. 02/15/19  Yes Rutherford Guys, MD  metFORMIN (GLUCOPHAGE XR) 500 MG 24 hr tablet Take 2 tablets (1,000 mg total) by mouth daily with breakfast. 02/15/19  Yes Rutherford Guys, MD  rosuvastatin (CRESTOR) 5 MG tablet Take 1 tablet (5 mg total) by mouth at bedtime. 02/15/19  Yes Rutherford Guys, MD    Past Medical History:  Diagnosis Date  . Arthritis   . Asthma   . Hypertension   . Morbid obesity ([place])   . Periumbilical hernia    pt unaware  . Prostate cancer ([place])   . Spondylolysis, lumbar region     Past  Surgical History:  Procedure Laterality Date  . COLONOSCOPY N/A 09/16/2016   Procedure: COLONOSCOPY;  Surgeon: Danie Binder, MD;  [facility];  Service: Endoscopy;  Laterality: N/A;  1:00 PM  . LYMPHADENECTOMY Bilateral 04/09/2018   Procedure: LYMPHADENECTOMY;  Surgeon: Raynelle Bring, MD;  [facility];  Service: Urology;  Laterality: Bilateral;  . POLYPECTOMY  09/16/2016   Procedure: POLYPECTOMY;  Surgeon: Danie Binder, MD;  [facility];  Service: Endoscopy;;  ascending colon polyp, hepatic flexure polyp, descending colon polyp,   . ROBOT ASSISTED LAPAROSCOPIC RADICAL PROSTATECTOMY N/A 04/09/2018   Procedure: XI ROBOTIC ASSISTED LAPAROSCOPIC RADICAL PROSTATECTOMY LEVEL 3;  Surgeon: Raynelle Bring, MD;  [facility];  Service: Urology;  Laterality: N/A;  ONLY NEEDS 210 MIN FOR ALL PROCEDURES  . SPINE SURGERY  05/1997   back fusion    Social History   Tobacco Use  . Smoking status: Never Smoker  . Smokeless tobacco: Never Used  Substance Use Topics  . Alcohol use: Yes    Comment: occasional    Family History  Problem Relation Age of Onset  . Heart disease Mother   . Stroke Mother   . Hypertension Mother   . Diabetes Mother   . Asthma Father   . Hypertension Brother  Review of Systems  Constitutional: Negative for chills and fever.  Respiratory: Negative for cough and shortness of breath.   Cardiovascular: Negative for chest pain, palpitations and leg swelling.  Gastrointestinal: Negative for abdominal pain, nausea and vomiting.     OBJECTIVE:  Today's Vitals   06/13/19 0948  BP: 138/88  Pulse: 100  Temp: 98.6 F (37 C)  SpO2: 100%  Weight: (!) 305 lb (138.3 kg)  Height: 5' 8.5" (1.74 m)   Body mass index is 45.7 kg/m.  Wt Readings from Last 3 Encounters:  06/13/19 (!) 305 lb (138.3 kg)  05/25/18 (!) 310 lb (140.6 kg)  04/09/18 (!) 307 lb (139.3 kg)    Physical Exam Vitals signs and nursing note reviewed.   Constitutional:      Appearance: He is well-developed.  HENT:     Head: Normocephalic and atraumatic.  Eyes:     Conjunctiva/sclera: Conjunctivae normal.     Pupils: Pupils are equal, round, and reactive to light.  Neck:     Musculoskeletal: Neck supple.  Cardiovascular:     Rate and Rhythm: Normal rate and regular rhythm.     Heart sounds: No murmur. No friction rub. No gallop.   Pulmonary:     Effort: Pulmonary effort is normal.     Breath sounds: Normal breath sounds. No wheezing or rales.  Skin:    General: Skin is warm and dry.  Neurological:     Mental Status: He is alert and oriented to person, place, and time.     Results for orders placed or performed in visit on 06/13/19 (from the past 24 hour(s))  POCT glycosylated hemoglobin (Hb A1C)     Status: Abnormal   Collection Time: 06/13/19 10:24 AM  Result Value Ref Range   Hemoglobin A1C 11.0 (A) 4.0 - 5.6 %   HbA1c POC (<> result, manual entry)     HbA1c, POC (prediabetic range)     HbA1c, POC (controlled diabetic range)      No results found.   ASSESSMENT and PLAN  1. Type 2 diabetes mellitus with hyperglycemia, without long-term current use of insulin (HCC) Uncontrolled. Adding trulicity, reviewed new med r/se/b. Cont working on [employer]. - CMP14+EGFR - POCT glycosylated hemoglobin (Hb A1C) - Lipid panel - Ambulatory referral to Ophthalmology  2. Essential hypertension Controlled. Continue current regime.  - CMP14+EGFR - POCT glycosylated hemoglobin (Hb A1C) - Lipid panel - Care order/instruction:  3. Pure hypercholesterolemia Checking labs today, medications will be adjusted as needed.  - CMP14+EGFR - Lipid panel  4. Need for prophylactic vaccination and inoculation against influenza - Flu Vaccine QUAD 36+ mos IM  Other orders - Dulaglutide (TRULICITY) 1.00 PE/9.6LT SOPN; Inject 0.75 mg into the skin once a week. - blood glucose meter kit and supplies; Per insurance preference. Check blood glucose  once a day. Dx E11.65  Return in about 3 months (around 09/12/2019).    Rutherford Guys, MD Primary Care at [facility] [place], [place] [address] Ph.  [phone] Fax [phone]

## 2019-06-14 ENCOUNTER — Telehealth: Payer: Self-pay | Admitting: Family Medicine

## 2019-06-14 LAB — LIPID PANEL
Chol/HDL Ratio: 2.9 ratio (ref 0.0–5.0)
Cholesterol, Total: 100 mg/dL (ref 100–199)
HDL: 35 mg/dL — ABNORMAL LOW (ref >39)
LDL Chol Calc (NIH): 50 mg/dL (ref 0–99)
Triglycerides: 68 mg/dL (ref 0–149)
VLDL Cholesterol Cal: 15 mg/dL (ref 5–40)

## 2019-06-14 LAB — CMP14+EGFR
ALT: 14 IU/L (ref 0–44)
AST: 13 IU/L (ref 0–40)
Albumin/Globulin Ratio: 1.5 (ref 1.2–2.2)
Albumin: 4.3 g/dL (ref 3.8–4.9)
Alkaline Phosphatase: 102 IU/L (ref 39–117)
BUN/Creatinine Ratio: 9 (ref 9–20)
BUN: 10 mg/dL (ref 6–24)
Bilirubin Total: 0.5 mg/dL (ref 0.0–1.2)
CO2: 25 mmol/L (ref 20–29)
Calcium: 9.8 mg/dL (ref 8.7–10.2)
Chloride: 101 mmol/L (ref 96–106)
Creatinine, Ser: 1.1 mg/dL (ref 0.76–1.27)
GFR calc Af Amer: 87 mL/min/{1.73_m2} (ref >59)
GFR calc non Af Amer: 75 mL/min/{1.73_m2} (ref >59)
Globulin, Total: 2.9 g/dL (ref 1.5–4.5)
Glucose: 196 mg/dL — ABNORMAL HIGH (ref 65–99)
Potassium: 4.3 mmol/L (ref 3.5–5.2)
Sodium: 142 mmol/L (ref 134–144)
Total Protein: 7.2 g/dL (ref 6.0–8.5)

## 2019-06-14 NOTE — Telephone Encounter (Signed)
Pt was denied the medication Trulicity by his insurance company. I spoke with the pharmacy dept through his insurance and they said that both Victoza and Ozempic are on his list of formularies. She stated that either one of the medications would still need a PA and after it has been prescribed I can do the PA for the patient.

## 2019-06-17 ENCOUNTER — Other Ambulatory Visit: Payer: Self-pay | Admitting: Family Medicine

## 2019-06-17 ENCOUNTER — Telehealth: Payer: Self-pay | Admitting: Family Medicine

## 2019-06-17 MED ORDER — OZEMPIC (0.25 OR 0.5 MG/DOSE) 2 MG/1.5ML ~~LOC~~ SOPN: 0.5000 mg | PEN_INJECTOR | SUBCUTANEOUS | 2 refills | Status: DC

## 2019-06-17 NOTE — Telephone Encounter (Signed)
LVM for patient to call me back. I did a PA for the patient and it was denied so the provider has sent in another script for something else. Told him to ask for Brandi when he called.

## 2019-06-17 NOTE — Telephone Encounter (Signed)
I sent in a prescription for ozempic. Please let him know that he needs to do 0.25mg  once a week for a month and then increase to prescribed dose of 0.5mg  weekly. Thanks.

## 2019-06-18 ENCOUNTER — Other Ambulatory Visit: Payer: Self-pay

## 2019-06-18 MED ORDER — SEMAGLUTIDE(0.25 OR 0.5MG/DOS) 2 MG/1.5ML ~~LOC~~ SOPN: 0.5000 mg | PEN_INJECTOR | SUBCUTANEOUS | 11 refills | Status: DC

## 2019-06-19 NOTE — Telephone Encounter (Signed)
I have reached out to the pharmacy and they said that there is not a generic for Ozempic. Please advise.

## 2019-06-20 NOTE — Telephone Encounter (Signed)
Discussed next steps with staff

## 2019-06-21 ENCOUNTER — Other Ambulatory Visit: Payer: Self-pay

## 2019-06-21 ENCOUNTER — Other Ambulatory Visit: Payer: Self-pay | Admitting: Family Medicine

## 2019-06-21 DIAGNOSIS — E119 Type 2 diabetes mellitus without complications: Secondary | ICD-10-CM

## 2019-06-21 MED ORDER — GLIMEPIRIDE 4 MG PO TABS: 4.0000 mg | ORAL_TABLET | Freq: Every day | ORAL | 1 refills | Status: DC

## 2019-06-21 MED ORDER — METFORMIN HCL ER 500 MG PO TB24: 1000.0000 mg | ORAL_TABLET | Freq: Every day | ORAL | 1 refills | Status: DC

## 2019-06-21 NOTE — Progress Notes (Signed)
Insurance only covers generics

## 2019-06-27 ENCOUNTER — Encounter: Payer: Self-pay | Admitting: Radiology

## 2019-08-05 ENCOUNTER — Encounter: Payer: Self-pay | Admitting: Gastroenterology

## 2019-08-19 DIAGNOSIS — C61 Malignant neoplasm of prostate: Secondary | ICD-10-CM | POA: Diagnosis not present

## 2019-08-30 DIAGNOSIS — C61 Malignant neoplasm of prostate: Secondary | ICD-10-CM | POA: Diagnosis not present

## 2019-08-30 DIAGNOSIS — N5201 Erectile dysfunction due to arterial insufficiency: Secondary | ICD-10-CM | POA: Diagnosis not present

## 2019-09-13 ENCOUNTER — Ambulatory Visit: Payer: BC Managed Care – PPO | Admitting: Family Medicine

## 2019-09-13 ENCOUNTER — Ambulatory Visit (INDEPENDENT_AMBULATORY_CARE_PROVIDER_SITE_OTHER): Payer: BC Managed Care – PPO

## 2019-09-13 ENCOUNTER — Encounter: Payer: Self-pay | Admitting: Family Medicine

## 2019-09-13 ENCOUNTER — Other Ambulatory Visit: Payer: Self-pay

## 2019-09-13 VITALS — BP 134/88 | HR 101 | Temp 98.9°F | Ht 68.5 in | Wt 314.0 lb

## 2019-09-13 DIAGNOSIS — E7800 Pure hypercholesterolemia, unspecified: Secondary | ICD-10-CM

## 2019-09-13 DIAGNOSIS — E119 Type 2 diabetes mellitus without complications: Secondary | ICD-10-CM

## 2019-09-13 DIAGNOSIS — R0602 Shortness of breath: Secondary | ICD-10-CM | POA: Diagnosis not present

## 2019-09-13 DIAGNOSIS — R0600 Dyspnea, unspecified: Secondary | ICD-10-CM

## 2019-09-13 DIAGNOSIS — R0609 Other forms of dyspnea: Secondary | ICD-10-CM

## 2019-09-13 DIAGNOSIS — G4700 Insomnia, unspecified: Secondary | ICD-10-CM

## 2019-09-13 DIAGNOSIS — I10 Essential (primary) hypertension: Secondary | ICD-10-CM

## 2019-09-13 LAB — POCT GLYCOSYLATED HEMOGLOBIN (HGB A1C): Hemoglobin A1C: 6.8 % — AB (ref 4.0–5.6)

## 2019-09-13 MED ORDER — TRAZODONE HCL 50 MG PO TABS: 25.0000 mg | ORAL_TABLET | Freq: Every evening | ORAL | 3 refills | Status: DC | PRN

## 2019-09-13 MED ORDER — ALBUTEROL SULFATE HFA 108 (90 BASE) MCG/ACT IN AERS: 2.0000 | INHALATION_SPRAY | Freq: Four times a day (QID) | RESPIRATORY_TRACT | 0 refills | Status: DC | PRN

## 2019-09-13 NOTE — Patient Instructions (Signed)
° ° ° °  If you have lab work done today you will be contacted with your lab results within the next 2 weeks.  If you have not heard from us then please contact us. The fastest way to get your results is to register for My Chart. ° ° °IF you received an x-ray today, you will receive an invoice from [facility]. Please contact [facility] at [phone] with questions or concerns regarding your invoice.  ° °IF you received labwork today, you will receive an invoice from [facility]. Please contact [facility] at [phone] with questions or concerns regarding your invoice.  ° °Our billing staff will not be able to assist you with questions regarding bills from these companies. ° °You will be contacted with the lab results as soon as they are available. The fastest way to get your results is to activate your My Chart account. Instructions are located on the last page of this paperwork. If you have not heard from us regarding the results in 2 weeks, please contact this office. °  ° ° ° °

## 2019-09-13 NOTE — Progress Notes (Signed)
12/18/202010:36 AM  Robert Mcintyre 12-05-1965, 53 y.o., male [id number]  Chief Complaint  Patient presents with  . Diabetes  . Cough    says he is getting really winded, causing coughing spell. Use to have asthma as a child    HPI:   Patient is a 53 y.o. male with past medical history significant for HTN, prostate cancer, morbid obesity, diabetes, DDD s/p laminectomy, colonic polypswho presents for followup  Last OV sept 5427 Added trulicity however could not afford so changed to glimperide Referred to ophtho  Checking cbgs daily, max 140s Tolerating meds well Denies any lows  Having DOE Has noticed that when he is active, walking to the store, getting in out of the truck, will get winded, dry cough, chest tightness, resolves with rest No wheezing No nausea or diaphoresis No edema, palpitations, orthopnea, PND He reports sleeping only in 3-4 hour, takes zquil, denies any snoring He does fall asleep easily during the day Does not wake up with headaches Has very irregular work hours Has been taking mucinex H/o asthma in childhood  Lab Results  Component Value Date   HGBA1C 11.0 (A) 06/13/2019   HGBA1C 7.4 (H) 01/29/2019   HGBA1C 7.3 (H) 05/25/2018   Lab Results  Component Value Date   LDLCALC 50 06/13/2019   CREATININE 1.10 06/13/2019    Depression screen PHQ 2/9 09/13/2019 06/13/2019 02/15/2019  Decreased Interest 0 0 0  Down, Depressed, Hopeless 0 0 0  PHQ - 2 Score 0 0 0    Fall Risk  09/13/2019 06/13/2019 02/15/2019 01/25/2019 01/25/2019  Falls in the past year? 0 0 0 0 0  Number falls in past yr: 0 0 0 0 0  Injury with Fall? 0 0 0 0 0     Allergies  Allergen Reactions  . Eggs Or Egg-Derived Products Nausea And Vomiting    Prior to Admission medications   Medication Sig Start Date End Date Taking? Authorizing Provider  amLODipine (NORVASC) 10 MG tablet Take 1 tablet (10 mg total) by mouth daily. 02/15/19  Yes Rutherford Guys, MD  blood glucose meter  kit and supplies Per insurance preference. Check blood glucose once a day. Dx E11.65 06/13/19  Yes Rutherford Guys, MD  glimepiride (AMARYL) 4 MG tablet Take 1 tablet (4 mg total) by mouth daily before breakfast. 06/21/19  Yes Rutherford Guys, MD  metFORMIN (GLUCOPHAGE XR) 500 MG 24 hr tablet Take 2 tablets (1,000 mg total) by mouth daily with breakfast. 06/21/19  Yes Rutherford Guys, MD  rosuvastatin (CRESTOR) 5 MG tablet Take 1 tablet (5 mg total) by mouth at bedtime. 02/15/19  Yes Rutherford Guys, MD    Past Medical History:  Diagnosis Date  . Arthritis   . Asthma   . Hypertension   . Morbid obesity ([place])   . Periumbilical hernia    pt unaware  . Prostate cancer ([place])   . Spondylolysis, lumbar region     Past Surgical History:  Procedure Laterality Date  . COLONOSCOPY N/A 09/16/2016   Procedure: COLONOSCOPY;  Surgeon: Danie Binder, MD;  [facility];  Service: Endoscopy;  Laterality: N/A;  1:00 PM  . LYMPHADENECTOMY Bilateral 04/09/2018   Procedure: LYMPHADENECTOMY;  Surgeon: Raynelle Bring, MD;  [facility];  Service: Urology;  Laterality: Bilateral;  . POLYPECTOMY  09/16/2016   Procedure: POLYPECTOMY;  Surgeon: Danie Binder, MD;  [facility];  Service: Endoscopy;;  ascending colon polyp, hepatic flexure polyp,  descending colon polyp,   . ROBOT ASSISTED LAPAROSCOPIC RADICAL PROSTATECTOMY N/A 04/09/2018   Procedure: XI ROBOTIC ASSISTED LAPAROSCOPIC RADICAL PROSTATECTOMY LEVEL 3;  Surgeon: Raynelle Bring, MD;  [facility];  Service: Urology;  Laterality: N/A;  ONLY NEEDS 210 MIN FOR ALL PROCEDURES  . SPINE SURGERY  05/1997   back fusion    Social History   Tobacco Use  . Smoking status: Never Smoker  . Smokeless tobacco: Never Used  Substance Use Topics  . Alcohol use: Yes    Comment: occasional    Family History  Problem Relation Age of Onset  . Heart disease Mother   . Stroke Mother   . Hypertension Mother   . Diabetes Mother   .  Asthma Father   . Hypertension Brother     ROS Per hpi  OBJECTIVE:  Today's Vitals   09/13/19 1031  BP: 134/88  Pulse: 84  Temp: 98.9 F (37.2 C)  SpO2: 98%  Weight: (!) 314 lb (142.4 kg)  Height: 5' 8.5" (1.74 m)   Body mass index is 47.05 kg/m.  Wt Readings from Last 3 Encounters:  09/13/19 (!) 314 lb (142.4 kg)  06/13/19 (!) 305 lb (138.3 kg)  05/25/18 (!) 310 lb (140.6 kg)    Physical Exam Vitals and nursing note reviewed.  Constitutional:      Appearance: He is well-developed.  HENT:     Head: Normocephalic and atraumatic.  Eyes:     Conjunctiva/sclera: Conjunctivae normal.     Pupils: Pupils are equal, round, and reactive to light.  Cardiovascular:     Rate and Rhythm: Normal rate and regular rhythm.     Heart sounds: No murmur. No friction rub. No gallop.   Pulmonary:     Effort: Pulmonary effort is normal.     Breath sounds: Normal breath sounds. No wheezing or rales.  Musculoskeletal:     Cervical back: Neck supple.     Right lower leg: No edema.     Left lower leg: No edema.  Skin:    General: Skin is warm and dry.  Neurological:     Mental Status: He is alert and oriented to person, place, and time.     Results for orders placed or performed in visit on 09/13/19 (from the past 24 hour(s))  POCT glycosylated hemoglobin (Hb A1C)     Status: Abnormal   Collection Time: 09/13/19 11:10 AM  Result Value Ref Range   Hemoglobin A1C 6.8 (A) 4.0 - 5.6 %   HbA1c POC (<> result, manual entry)     HbA1c, POC (prediabetic range)     HbA1c, POC (controlled diabetic range)      DG Chest 2 View  Result Date: 09/13/2019 CLINICAL DATA:  Three-month recheck.  Short of breath. EXAM: CHEST - 2 VIEW COMPARISON:  None FINDINGS: The heart size and mediastinal contours are within normal limits. Both lungs are clear. The visualized skeletal structures are unremarkable. IMPRESSION: No active cardiopulmonary disease. Electronically Signed   By: Kerby Moors M.D.    On: 09/13/2019 11:00    My interpretation of EKG:  NSR, HR 80, no acute ST changes, unchanged to previous  Normal 6 minute walk  ASSESSMENT and PLAN  1. Type 2 diabetes mellitus without complication, without long-term current use of insulin (HCC) Controlled. Continue current regime.  Discussed LFM - CMET with GFR - Lipid panel - POCT glycosylated hemoglobin (Hb A1C) - TSH  2. Essential hypertension Controlled. Continue current regime.  - CMET  with GFR - Lipid panel  3. Pure hypercholesterolemia Controlled. Continue current regime.  - CMET with GFR - Lipid panel  4. DOE (dyspnea on exertion) eval in clinic reassuring. Trial of albuterol, cont with weight loss. Low threshold for cards referral given RF - CBC - EKG 12-Lead - DG Chest 2 View; Future  5. Insomnia, unspecified type Work shift related, trial of trazodone, reviewed r/se/b  Other orders - albuterol (VENTOLIN HFA) 108 (90 Base) MCG/ACT inhaler; Inhale 2 puffs into the lungs every 6 (six) hours as needed for wheezing or shortness of breath. - traZODone (DESYREL) 50 MG tablet; Take 0.5-1 tablets (25-50 mg total) by mouth at bedtime as needed for sleep.  Return in about 4 weeks (around 10/11/2019) for shortness of breath.    Rutherford Guys, MD Primary Care at [place], [place] [address] Ph.  [phone] Fax [phone]

## 2019-09-14 LAB — CMP14+EGFR
ALT: 12 IU/L (ref 0–44)
AST: 11 IU/L (ref 0–40)
Albumin/Globulin Ratio: 1.3 (ref 1.2–2.2)
Albumin: 4.1 g/dL (ref 3.8–4.9)
Alkaline Phosphatase: 99 IU/L (ref 39–117)
BUN/Creatinine Ratio: 9 (ref 9–20)
BUN: 10 mg/dL (ref 6–24)
Bilirubin Total: 0.6 mg/dL (ref 0.0–1.2)
CO2: 29 mmol/L (ref 20–29)
Calcium: 9.5 mg/dL (ref 8.7–10.2)
Chloride: 100 mmol/L (ref 96–106)
Creatinine, Ser: 1.09 mg/dL (ref 0.76–1.27)
GFR calc Af Amer: 88 mL/min/{1.73_m2} (ref >59)
GFR calc non Af Amer: 76 mL/min/{1.73_m2} (ref >59)
Globulin, Total: 3.2 g/dL (ref 1.5–4.5)
Glucose: 69 mg/dL (ref 65–99)
Potassium: 4.4 mmol/L (ref 3.5–5.2)
Sodium: 142 mmol/L (ref 134–144)
Total Protein: 7.3 g/dL (ref 6.0–8.5)

## 2019-09-14 LAB — LIPID PANEL
Chol/HDL Ratio: 3.3 ratio (ref 0.0–5.0)
Cholesterol, Total: 127 mg/dL (ref 100–199)
HDL: 39 mg/dL — ABNORMAL LOW (ref >39)
LDL Chol Calc (NIH): 76 mg/dL (ref 0–99)
Triglycerides: 55 mg/dL (ref 0–149)
VLDL Cholesterol Cal: 12 mg/dL (ref 5–40)

## 2019-09-14 LAB — CBC
Hematocrit: 42.9 % (ref 37.5–51.0)
Hemoglobin: 14.3 g/dL (ref 13.0–17.7)
MCH: 29.9 pg (ref 26.6–33.0)
MCHC: 33.3 g/dL (ref 31.5–35.7)
MCV: 90 fL (ref 79–97)
Platelets: 310 10*3/uL (ref 150–450)
RBC: 4.79 x10E6/uL (ref 4.14–5.80)
RDW: 12.3 % (ref 11.6–15.4)
WBC: 8.1 10*3/uL (ref 3.4–10.8)

## 2019-09-14 LAB — TSH: TSH: 1.38 u[IU]/mL (ref 0.450–4.500)

## 2019-09-16 ENCOUNTER — Encounter: Payer: Self-pay | Admitting: Radiology

## 2019-09-16 ENCOUNTER — Telehealth: Payer: Self-pay | Admitting: Family Medicine

## 2019-09-16 NOTE — Telephone Encounter (Signed)
albuterol (VENTOLIN HFA) 108 (90 Base) MCG/ACT inhaler [id number]   Pt requesting a change of prescription because his insurance does not cover  Please advise    [facility] [address] [place]  [place], [place] [address]  Phone:  [phone] Fax:  [phone]

## 2019-09-18 NOTE — Telephone Encounter (Signed)
Patient need a change in medication

## 2019-09-19 MED ORDER — ALBUTEROL SULFATE HFA 108 (90 BASE) MCG/ACT IN AERS: 2.0000 | INHALATION_SPRAY | Freq: Four times a day (QID) | RESPIRATORY_TRACT | 3 refills | Status: DC | PRN

## 2019-10-09 ENCOUNTER — Other Ambulatory Visit: Payer: Self-pay | Admitting: Family Medicine

## 2019-10-09 DIAGNOSIS — E119 Type 2 diabetes mellitus without complications: Secondary | ICD-10-CM

## 2019-10-09 MED ORDER — METFORMIN HCL ER 500 MG PO TB24: 1000.0000 mg | ORAL_TABLET | Freq: Every day | ORAL | 1 refills | Status: DC

## 2019-10-09 NOTE — Telephone Encounter (Signed)
Medication Refill - Medication: metformin   Has the patient contacted their pharmacy? Yes.   Pt states he only has one pill left. Please advise.  (Agent: If no, request that the patient contact the pharmacy for the refill.) (Agent: If yes, when and what did the pharmacy advise?)  Preferred Pharmacy (with phone number or street name):  [facility], [place] [address]  Phone: [phone] Fax: [phone]  Not a 24 hour pharmacy; exact hours not known.     Agent: Please be advised that RX refills may take up to 3 business days. We ask that you follow-up with your pharmacy.

## 2019-10-14 ENCOUNTER — Ambulatory Visit: Payer: BC Managed Care – PPO | Admitting: Family Medicine

## 2019-10-25 ENCOUNTER — Other Ambulatory Visit: Payer: Self-pay

## 2019-10-25 ENCOUNTER — Ambulatory Visit: Payer: BC Managed Care – PPO | Admitting: Family Medicine

## 2019-10-25 ENCOUNTER — Encounter: Payer: Self-pay | Admitting: Family Medicine

## 2019-10-25 VITALS — BP 128/78 | HR 97 | Temp 98.6°F | Ht 68.5 in | Wt 318.0 lb

## 2019-10-25 DIAGNOSIS — Z8709 Personal history of other diseases of the respiratory system: Secondary | ICD-10-CM

## 2019-10-25 DIAGNOSIS — R0600 Dyspnea, unspecified: Secondary | ICD-10-CM

## 2019-10-25 DIAGNOSIS — R0609 Other forms of dyspnea: Secondary | ICD-10-CM

## 2019-10-25 NOTE — Progress Notes (Signed)
1/29/202111:09 AM  Robert Mcintyre 1966-07-31, 54 y.o., male [id number]  Chief Complaint  Patient presents with  . Shortness of Breath    say she feels as the cold air is taking his breathe    HPI:   Patient is a 54 y.o. male with past medical history significant for HTN, prostate cancer, morbid obesity, diabetes, DDD s/p laminectomy, colonic polypswho presents for followup   Last OV dec 2020 - trial of albuterol Albuterol helps when he feels SOB SOB triggers: physical exertion, laughing, coughing, talking, cold air No more wheezing, gets winded, feels as if he cant open up and get enough air No chest pain or pressure or palpitations No diaphoresis, nausea  Had asthma as a child Last known asthma attack is his 64s triggered by mowing the lawn Has not had any breathing issues until now Father died of asthma attack  Depression screen [facility] 2/9 10/25/2019 09/13/2019 06/13/2019  Decreased Interest 0 0 0  Down, Depressed, Hopeless 0 0 0  PHQ - 2 Score 0 0 0    Fall Risk  10/25/2019 09/13/2019 06/13/2019 02/15/2019 01/25/2019  Falls in the past year? 0 0 0 0 0  Number falls in past yr: 0 0 0 0 0  Injury with Fall? 0 0 0 0 0     Allergies  Allergen Reactions  . Eggs Or Egg-Derived Products Nausea And Vomiting    Prior to Admission medications   Medication Sig Start Date End Date Taking? Authorizing Provider  albuterol (PROAIR HFA) 108 (90 Base) MCG/ACT inhaler Inhale 2 puffs into the lungs every 6 (six) hours as needed for wheezing or shortness of breath. 09/19/19  Yes Rutherford Guys, MD  amLODipine (NORVASC) 10 MG tablet Take 1 tablet (10 mg total) by mouth daily. 02/15/19  Yes Rutherford Guys, MD  blood glucose meter kit and supplies Per insurance preference. Check blood glucose once a day. Dx E11.65 06/13/19  Yes Rutherford Guys, MD  glimepiride (AMARYL) 4 MG tablet Take 1 tablet (4 mg total) by mouth daily before breakfast. 06/21/19  Yes Rutherford Guys, MD  metFORMIN  (GLUCOPHAGE XR) 500 MG 24 hr tablet Take 2 tablets (1,000 mg total) by mouth daily with breakfast. 10/09/19  Yes Rutherford Guys, MD  rosuvastatin (CRESTOR) 5 MG tablet Take 1 tablet (5 mg total) by mouth at bedtime. 02/15/19  Yes Rutherford Guys, MD  traZODone (DESYREL) 50 MG tablet Take 0.5-1 tablets (25-50 mg total) by mouth at bedtime as needed for sleep. 09/13/19  Yes Rutherford Guys, MD    Past Medical History:  Diagnosis Date  . Arthritis   . Asthma   . Hypertension   . Morbid obesity ([place])   . Periumbilical hernia    pt unaware  . Prostate cancer ([place])   . Spondylolysis, lumbar region     Past Surgical History:  Procedure Laterality Date  . COLONOSCOPY N/A 09/16/2016   Procedure: COLONOSCOPY;  Surgeon: Danie Binder, MD;  [facility];  Service: Endoscopy;  Laterality: N/A;  1:00 PM  . LYMPHADENECTOMY Bilateral 04/09/2018   Procedure: LYMPHADENECTOMY;  Surgeon: Raynelle Bring, MD;  [facility];  Service: Urology;  Laterality: Bilateral;  . POLYPECTOMY  09/16/2016   Procedure: POLYPECTOMY;  Surgeon: Danie Binder, MD;  [facility];  Service: Endoscopy;;  ascending colon polyp, hepatic flexure polyp, descending colon polyp,   . ROBOT ASSISTED LAPAROSCOPIC RADICAL PROSTATECTOMY N/A 04/09/2018   Procedure: XI ROBOTIC  ASSISTED LAPAROSCOPIC RADICAL PROSTATECTOMY LEVEL 3;  Surgeon: Raynelle Bring, MD;  [facility];  Service: Urology;  Laterality: N/A;  ONLY NEEDS 210 MIN FOR ALL PROCEDURES  . SPINE SURGERY  05/1997   back fusion    Social History   Tobacco Use  . Smoking status: Never Smoker  . Smokeless tobacco: Never Used  Substance Use Topics  . Alcohol use: Yes    Comment: occasional    Family History  Problem Relation Age of Onset  . Heart disease Mother   . Stroke Mother   . Hypertension Mother   . Diabetes Mother   . Asthma Father   . Hypertension Brother     ROS Per hpi  OBJECTIVE:  YNWGN'F AOZHYQ   10/25/19 1105    BP: 128/78  Pulse: 97  Temp: 98.6 F (37 C)  SpO2: 97%  Weight: (!) 318 lb (144.2 kg)  Height: 5' 8.5" (1.74 m)   Body mass index is 47.65 kg/m.   Physical Exam Vitals and nursing note reviewed.  Constitutional:      Appearance: He is well-developed.  HENT:     Head: Normocephalic and atraumatic.  Eyes:     Conjunctiva/sclera: Conjunctivae normal.     Pupils: Pupils are equal, round, and reactive to light.  Cardiovascular:     Rate and Rhythm: Normal rate and regular rhythm.     Heart sounds: No murmur. No friction rub. No gallop.   Pulmonary:     Effort: Pulmonary effort is normal.     Breath sounds: Normal breath sounds. No wheezing or rales.  Musculoskeletal:     Cervical back: Neck supple.  Skin:    General: Skin is warm and dry.  Neurological:     Mental Status: He is alert and oriented to person, place, and time.       No results found for this or any previous visit (from the past 24 hour(s)).  No results found.   ASSESSMENT and PLAN  1. DOE (dyspnea on exertion) 2. History of asthma Responsive to albuterol. Referring to specialist for further eval and treatment as history suggestive of asthma. Discussed RTC precautions and concerns for cardiac referral. - Ambulatory referral to Allergy  Return in about 3 months (around 01/23/2020) for DM/followup from [place], MD Primary Care at [place], [place] [address] Ph.  [phone] Fax [phone]

## 2019-10-25 NOTE — Patient Instructions (Signed)
° ° ° °  If you have lab work done today you will be contacted with your lab results within the next 2 weeks.  If you have not heard from us then please contact us. The fastest way to get your results is to register for My Chart. ° ° °IF you received an x-ray today, you will receive an invoice from [facility]. Please contact [facility] at [phone] with questions or concerns regarding your invoice.  ° °IF you received labwork today, you will receive an invoice from [facility]. Please contact [facility] at [phone] with questions or concerns regarding your invoice.  ° °Our billing staff will not be able to assist you with questions regarding bills from these companies. ° °You will be contacted with the lab results as soon as they are available. The fastest way to get your results is to activate your My Chart account. Instructions are located on the last page of this paperwork. If you have not heard from us regarding the results in 2 weeks, please contact this office. °  ° ° ° °

## 2019-11-20 ENCOUNTER — Encounter: Payer: Self-pay | Admitting: Allergy

## 2019-11-20 ENCOUNTER — Other Ambulatory Visit: Payer: Self-pay

## 2019-11-20 ENCOUNTER — Ambulatory Visit: Payer: BC Managed Care – PPO | Admitting: Allergy

## 2019-11-20 VITALS — BP 150/80 | HR 88 | Temp 98.8°F | Resp 18 | Ht 68.0 in | Wt 320.2 lb

## 2019-11-20 DIAGNOSIS — J4530 Mild persistent asthma, uncomplicated: Secondary | ICD-10-CM | POA: Diagnosis not present

## 2019-11-20 DIAGNOSIS — J4520 Mild intermittent asthma, uncomplicated: Secondary | ICD-10-CM | POA: Diagnosis not present

## 2019-11-20 DIAGNOSIS — Z91012 Allergy to eggs: Secondary | ICD-10-CM | POA: Diagnosis not present

## 2019-11-20 DIAGNOSIS — H1013 Acute atopic conjunctivitis, bilateral: Secondary | ICD-10-CM | POA: Diagnosis not present

## 2019-11-20 MED ORDER — BUDESONIDE-FORMOTEROL FUMARATE 160-4.5 MCG/ACT IN AERO: 2.0000 | INHALATION_SPRAY | Freq: Two times a day (BID) | RESPIRATORY_TRACT | 5 refills | Status: DC

## 2019-11-20 MED ORDER — BEPREVE 1.5 % OP SOLN: 1.0000 [drp] | Freq: Two times a day (BID) | OPHTHALMIC | 5 refills | Status: DC | PRN

## 2019-11-20 NOTE — Progress Notes (Signed)
New Patient Note  RE: Robert Mcintyre MRN: [id number] DOB: [birth year] Date of Office Visit: 11/20/2019  Referring provider: Rutherford Guys, MD Primary care provider: Rutherford Guys, MD  Chief Complaint: asthma, allergies and food allergy  History of present illness: Robert Mcintyre is a 54 y.o. male presenting today for consultation for asthma, allergic rhinitis and egg allergy.   He states he doesn't eat stove-top egg.  He has not eaten eggs in 54 years.  He states his mother said when he was little he "couldn't keep them down".  He is able to eat baked egg products.  He has no interest in eating stove-top eggs.   He has history of asthma from childhood.  He states he was told he had asthma when he was born.  He was also told he grew out of it when he got older.  He states he didn't have any respiratory issues for years.  However he states over the past year he has felt more short of breath.  He also reports having bouts of coughing.  He states if he goes outside and the weather is cool/cold and if he is active it "takes his breath away" and he will have shortness of breath or coughing.  He was prescribed an albuterol inhaler couple months ago.  He has been using albuterol about couple times a month at this time.    With his allergies he states that pollens during spring cause him to have shortness of breath and watery eyes.  He has used benadryl in the past.  He has also used sudafed.  He states he just tries to avoid tree pollen by staying inside more.      No history of eczema.    Review of systems: Review of Systems  Constitutional: Negative.   HENT: Negative.   Eyes: Negative.   Respiratory: Positive for cough and shortness of breath.   Cardiovascular: Negative.   Gastrointestinal: Negative.   Musculoskeletal: Negative.   Skin: Negative.   Neurological: Negative.     All other systems negative unless noted above in HPI  Past medical history: Past Medical History:    Diagnosis Date  . Arthritis   . Asthma   . Hypertension   . Morbid obesity ([place])   . Periumbilical hernia    pt unaware  . Prostate cancer ([place])   . Spondylolysis, lumbar region     Past surgical history: Past Surgical History:  Procedure Laterality Date  . COLONOSCOPY N/A 09/16/2016   Procedure: COLONOSCOPY;  Surgeon: Danie Binder, MD;  [facility];  Service: Endoscopy;  Laterality: N/A;  1:00 PM  . LYMPHADENECTOMY Bilateral 04/09/2018   Procedure: LYMPHADENECTOMY;  Surgeon: Raynelle Bring, MD;  [facility];  Service: Urology;  Laterality: Bilateral;  . POLYPECTOMY  09/16/2016   Procedure: POLYPECTOMY;  Surgeon: Danie Binder, MD;  [facility];  Service: Endoscopy;;  ascending colon polyp, hepatic flexure polyp, descending colon polyp,   . ROBOT ASSISTED LAPAROSCOPIC RADICAL PROSTATECTOMY N/A 04/09/2018   Procedure: XI ROBOTIC ASSISTED LAPAROSCOPIC RADICAL PROSTATECTOMY LEVEL 3;  Surgeon: Raynelle Bring, MD;  [facility];  Service: Urology;  Laterality: N/A;  ONLY NEEDS 210 MIN FOR ALL PROCEDURES  . SPINE SURGERY  05/1997   back fusion    Family history:  Family History  Problem Relation Age of Onset  . Heart disease Mother   . Stroke Mother   . Hypertension Mother   . Diabetes  Mother   . Asthma Father   . Hypertension Brother     Social history: He lives in a house with carpeting in the bedroom with gas and electric heating and central cooling.  There is no concern for water damage, mildew or roaches in the home. Occupational History  . Occupation: truck Geophysicist/field seismologist  Tobacco Use  . Smoking status: Former Smoker    Packs/day: 0.25    Years: 3.00    Pack years: 0.75    Types: Cigarettes    Quit date: 1980    Years since quitting: 41.1  . Smokeless tobacco: Never Used     Medication List: Current Outpatient Medications  Medication Sig Dispense Refill  . albuterol (PROAIR HFA) 108 (90 Base) MCG/ACT inhaler Inhale 2 puffs into the  lungs every 6 (six) hours as needed for wheezing or shortness of breath. 18 g 3  . amLODipine (NORVASC) 10 MG tablet Take 1 tablet (10 mg total) by mouth daily. 90 tablet 1  . blood glucose meter kit and supplies Per insurance preference. Check blood glucose once a day. Dx E11.65 1 each 11  . glimepiride (AMARYL) 4 MG tablet Take 1 tablet (4 mg total) by mouth daily before breakfast. 90 tablet 1  . metFORMIN (GLUCOPHAGE XR) 500 MG 24 hr tablet Take 2 tablets (1,000 mg total) by mouth daily with breakfast. 90 tablet 1  . rosuvastatin (CRESTOR) 5 MG tablet Take 1 tablet (5 mg total) by mouth at bedtime. 90 tablet 3  . traZODone (DESYREL) 50 MG tablet Take 0.5-1 tablets (25-50 mg total) by mouth at bedtime as needed for sleep. 30 tablet 3  . Bepotastine Besilate (BEPREVE) 1.5 % SOLN Place 1 drop into both eyes 2 (two) times daily as needed. 10 mL 5  . budesonide-formoterol (SYMBICORT) 160-4.5 MCG/ACT inhaler Inhale 2 puffs into the lungs 2 (two) times daily. 1 Inhaler 5   No current facility-administered medications for this visit.    Known medication allergies: Allergies  Allergen Reactions  . Eggs Or Egg-Derived Products Nausea And Vomiting     Physical examination: Blood pressure (!) 150/80, pulse 88, temperature 98.8 F (37.1 C), temperature source Temporal, resp. rate 18, height '5\' 8"'  (1.727 m), weight (!) 320 lb 3.2 oz (145.2 kg), SpO2 96 %.  General: Alert, interactive, in no acute distress. HEENT: PERRLA, TMs pearly gray, turbinates non-edematous without discharge, post-pharynx non erythematous. Neck: Supple without lymphadenopathy. Lungs: Clear to auscultation without wheezing, rhonchi or rales. {no increased work of breathing. CV: Normal S1, S2 without murmurs. Abdomen: Nondistended, nontender. Skin: Warm and dry, without lesions or rashes. Extremities:  No clubbing, cyanosis or edema. Neuro:   Grossly intact.  Diagnositics/Labs:  Spirometry: FEV1: 1.92L 64%, FVC: 2.57L 67%  predicted.  He did not have a significant improvement postbronchodilator.  Only a improvement of FEV1 to 2.02 L or 67% predicted.  Allergy testing: Environmental allergy skin prick testing is positive to grass pollens, weed pollens, tree pollens, dust mites, cat, dog and cockroach. Egg skin prick is negative. Allergy testing results were read and interpreted by provider, documented by clinical staff.   Assessment and plan:   Asthma, mild persistent  - have access to albuterol inhaler 2 puffs every 4-6 hours as needed for cough/wheeze/shortness of breath/chest tightness.  May use 15-20 minutes prior to activity.   Monitor frequency of use.    - will start a daily controller inhaler to take daily during spring and winter seasons at this time.  Start Symbicort 139mg  2 puffs twice a day  Asthma control goals:   Full participation in all desired activities (may need albuterol before activity)  Albuterol use two time or less a week on average (not counting use with activity)  Cough interfering with sleep two time or less a month  Oral steroids no more than once a year  No hospitalizations   Allergic conjunctivitis  - environmental allergy skin testing is positive to grasses, weeds, trees, dust mites, cat, dog, cockroach  - allergen avoidance measures discussed/handouts provided  - Bepreve 1 drop each eye twice a day as needed for watery/itchy/red eyes   Food allergy  - Egg skin testing is negative  -It is likely that after 50+ years of stovetop egg avoidance he is no longer allergic however he has no interest in eating stovetop eggs at all in I will continue to avoid.  At this time I do not feel he needs to have access to an epinephrine device.  I did discuss with him if he would ever like to eat stovetop eggs that we would recommend performing an in office challenge to ensure that he is nonallergic at this time.   Follow-up in 3-4 months or sooner if needed  I appreciate the  opportunity to take part in [facility]'s care. Please do not hesitate to contact me with questions.  Sincerely,   Prudy Feeler, MD Allergy/Immunology [facility] [place] [facility] [place]

## 2019-11-20 NOTE — Patient Instructions (Addendum)
Asthma  - have access to albuterol inhaler 2 puffs every 4-6 hours as needed for cough/wheeze/shortness of breath/chest tightness.  May use 15-20 minutes prior to activity.   Monitor frequency of use.    - will start a daily controller inhaler to take daily during spring and winter seasons at this time.  Start Symbicort 199mcg 2 puffs twice a day  Asthma control goals:   Full participation in all desired activities (may need albuterol before activity)  Albuterol use two time or less a week on average (not counting use with activity)  Cough interfering with sleep two time or less a month  Oral steroids no more than once a year  No hospitalizations   Allergic conjunctivitis  - environmental allergy skin testing is positive to grasses, weeds, trees, dust mites, cat, dog, cockroach  - allergen avoidance measures discussed/handouts provided  - Bepreve 1 drop each eye twice a day as needed for watery/itchy/red eyes   Food allergy  - Egg skin testing is negative  - if interested in eating egg in the future let us know and will need to obtain labwork and perform in-office challenge   Follow-up in 3-4 months or sooner if needed

## 2020-01-02 ENCOUNTER — Other Ambulatory Visit: Payer: Self-pay

## 2020-01-02 ENCOUNTER — Telehealth: Payer: Self-pay | Admitting: Family Medicine

## 2020-01-02 MED ORDER — GLIMEPIRIDE 4 MG PO TABS: 4.0000 mg | ORAL_TABLET | Freq: Every day | ORAL | 1 refills | Status: DC

## 2020-01-02 NOTE — Telephone Encounter (Signed)
What is the name of the medication?Robert Mcintyre [id number]  Robert Mcintyre [id number]   Have you contacted your pharmacy to request a refill? He did, no more refills left. Pt does have an upcoming appointment on 01/24/20.  Which pharmacy would you like this sent to? [facility] [address], [place], [place] [address]  Phone:  [phone] Fax:  [phone]       Patient notified that their request is being sent to the clinical staff for review and that they should receive a call once it is complete. If they do not receive a call within 72 hours they can check with their pharmacy or our office.

## 2020-01-16 NOTE — Telephone Encounter (Signed)
Pt called his is out of Metformin 500 Mg Please Advice

## 2020-01-20 ENCOUNTER — Other Ambulatory Visit: Payer: Self-pay | Admitting: Family Medicine

## 2020-01-20 DIAGNOSIS — E119 Type 2 diabetes mellitus without complications: Secondary | ICD-10-CM

## 2020-01-24 ENCOUNTER — Telehealth: Payer: Self-pay | Admitting: Family Medicine

## 2020-01-24 ENCOUNTER — Encounter: Payer: Self-pay | Admitting: Family Medicine

## 2020-01-24 ENCOUNTER — Other Ambulatory Visit: Payer: Self-pay

## 2020-01-24 ENCOUNTER — Ambulatory Visit: Payer: BC Managed Care – PPO | Admitting: Family Medicine

## 2020-01-24 VITALS — BP 138/80 | HR 88 | Temp 98.7°F | Ht 68.0 in | Wt 319.0 lb

## 2020-01-24 DIAGNOSIS — I10 Essential (primary) hypertension: Secondary | ICD-10-CM

## 2020-01-24 DIAGNOSIS — E7800 Pure hypercholesterolemia, unspecified: Secondary | ICD-10-CM | POA: Diagnosis not present

## 2020-01-24 DIAGNOSIS — E119 Type 2 diabetes mellitus without complications: Secondary | ICD-10-CM | POA: Diagnosis not present

## 2020-01-24 MED ORDER — SITAGLIPTIN PHOSPHATE 100 MG PO TABS: 100.0000 mg | ORAL_TABLET | Freq: Every day | ORAL | 1 refills | Status: DC

## 2020-01-24 MED ORDER — GLIMEPIRIDE 2 MG PO TABS: 2.0000 mg | ORAL_TABLET | Freq: Every day | ORAL | 1 refills | Status: DC

## 2020-01-24 NOTE — Patient Instructions (Signed)
° ° ° °  If you have lab work done today you will be contacted with your lab results within the next 2 weeks.  If you have not heard from us then please contact us. The fastest way to get your results is to register for My Chart. ° ° °IF you received an x-ray today, you will receive an invoice from [facility]. Please contact [facility] at [phone] with questions or concerns regarding your invoice.  ° °IF you received labwork today, you will receive an invoice from [facility]. Please contact [facility] at [phone] with questions or concerns regarding your invoice.  ° °Our billing staff will not be able to assist you with questions regarding bills from these companies. ° °You will be contacted with the lab results as soon as they are available. The fastest way to get your results is to activate your My Chart account. Instructions are located on the last page of this paperwork. If you have not heard from us regarding the results in 2 weeks, please contact this office. °  ° ° ° °

## 2020-01-24 NOTE — Telephone Encounter (Signed)
I have spoken to provider and she stated that pt is to go back on Glimepride 2mg  daily. Januvia was taken off the med list.   Attempted to call pt and there was no answer so I left a general message to call back.   If pt calls back we can relay that message to him.   Thanks

## 2020-01-24 NOTE — Telephone Encounter (Signed)
I have called the pharmacy to gather some more information and they stated that the #30 is over $500 and Januvia is only a BRAND medication. No generic available.His insurance is not covering medication and PA Not available to do according his insurance.    Please advise if another medication can be called in.

## 2020-01-24 NOTE — Progress Notes (Signed)
4/30/20219:11 AM  Robert Mcintyre 02/27/1966, 54 y.o., male [id number]  Chief Complaint  Patient presents with  . Hypertension    question about supplements   . Diabetes    fatigued     HPI:   Patient is a 54 y.o. 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 He is wondering about a b12 supplement Insurance copay truclicity and ozempic too high  Had DOT physical recently and had high BP as he was stressed as he had to rush from work to his physical and was afraid to run late  Tends to work 10-14 hour days  Lab Results  Component Value Date   HGBA1C 6.8 (A) 09/13/2019   HGBA1C 11.0 (A) 06/13/2019   HGBA1C 7.4 (H) 01/29/2019   Lab Results  Component Value Date   LDLCALC 76 09/13/2019   CREATININE 1.09 09/13/2019    Depression screen PHQ 2/9 01/24/2020 10/25/2019 09/13/2019  Decreased Interest 0 0 0  Down, Depressed, Hopeless 0 0 0  PHQ - 2 Score 0 0 0    Fall Risk  01/24/2020 10/25/2019 09/13/2019 06/13/2019 02/15/2019  Falls in the past year? 0 0 0 0 0  Number falls in past yr: 0 0 0 0 0  Injury with Fall? 0 0 0 0 0  Follow up Falls evaluation completed - - - -     Allergies  Allergen Reactions  . Eggs Or Egg-Derived Products Nausea And Vomiting    Prior to Admission medications   Medication Sig Start Date End Date Taking? Authorizing Provider  amLODipine (NORVASC) 10 MG tablet Take 1 tablet (10 mg total) by mouth daily. 02/15/19  Yes Rutherford Guys, MD  blood  glucose meter kit and supplies Per insurance preference. Check blood glucose once a day. Dx E11.65 06/13/19  Yes Rutherford Guys, MD  glimepiride (AMARYL) 4 MG tablet Take 1 tablet (4 mg total) by mouth daily before breakfast. 01/02/20  Yes Rutherford Guys, MD  metFORMIN (GLUCOPHAGE-XR) 500 MG 24 hr tablet TAKE 2 TABLETS BY MOUTH ONCE DAILY WITH BREAKFAST 01/20/20  Yes Rutherford Guys, MD  rosuvastatin (CRESTOR) 5 MG tablet Take 1 tablet (5 mg total) by mouth at bedtime. 02/15/19  Yes Rutherford Guys, MD  traZODone (DESYREL) 50 MG tablet Take 0.5-1 tablets (25-50 mg total) by mouth at bedtime as needed for sleep. 09/13/19  Yes Rutherford Guys, MD  albuterol [facility] HFA) 108 (90 Base) MCG/ACT inhaler Inhale 2 puffs into the lungs every 6 (six) hours as needed for wheezing or shortness of breath. Patient not taking: Reported on 01/24/2020 09/19/19   Rutherford Guys, MD  Bepotastine Besilate (BEPREVE) 1.5 % SOLN Place 1 drop into both eyes 2 (two) times daily as needed. Patient not taking: Reported on 01/24/2020 11/20/19   Kennith Gain, MD  budesonide-formoterol [facility]) 160-4.5 MCG/ACT inhaler Inhale 2 puffs into the lungs 2 (two) times daily. Patient not taking: Reported on 01/24/2020 11/20/19   Nelva Bush,  Rae Halsted, MD    Past Medical History:  Diagnosis Date  . Arthritis   . Asthma   . Hypertension   . Morbid obesity ([place])   . Periumbilical hernia    pt unaware  . Prostate cancer ([place])   . Spondylolysis, lumbar region     Past Surgical History:  Procedure Laterality Date  . COLONOSCOPY N/A 09/16/2016   Procedure: COLONOSCOPY;  Surgeon: Danie Binder, MD;  [facility];  Service: Endoscopy;  Laterality: N/A;  1:00 PM  . LYMPHADENECTOMY Bilateral 04/09/2018   Procedure: LYMPHADENECTOMY;  Surgeon: Raynelle Bring, MD;  Location: [facility];  Service: Urology;  Laterality: Bilateral;  . POLYPECTOMY  09/16/2016   Procedure: POLYPECTOMY;  Surgeon: Danie Binder, MD;   [facility];  Service: Endoscopy;;  ascending colon polyp, hepatic flexure polyp, descending colon polyp,   . ROBOT ASSISTED LAPAROSCOPIC RADICAL PROSTATECTOMY N/A 04/09/2018   Procedure: XI ROBOTIC ASSISTED LAPAROSCOPIC RADICAL PROSTATECTOMY LEVEL 3;  Surgeon: Raynelle Bring, MD;  Location[facility];  Service: Urology;  Laterality: N/A;  ONLY NEEDS 210 MIN FOR ALL PROCEDURES  . SPINE SURGERY  05/1997   back fusion    Social History   Tobacco Use  . Smoking status: Former Smoker    Packs/day: 0.25    Years: 3.00    Pack years: 0.75    Types: Cigarettes    Quit date: 1980    Years since quitting: 41.3  . Smokeless tobacco: Never Used  Substance Use Topics  . Alcohol use: Yes    Comment: occasional    Family History  Problem Relation Age of Onset  . Heart disease Mother   . Stroke Mother   . Hypertension Mother   . Diabetes Mother   . Asthma Father   . Hypertension Brother     Review of Systems  Constitutional: Negative for chills and fever.  Respiratory: Negative for cough and shortness of breath.   Cardiovascular: Negative for chest pain, palpitations and leg swelling.  Gastrointestinal: Negative for abdominal pain, nausea and vomiting.     OBJECTIVE:  Today's Vitals   01/24/20 0900 01/24/20 0912  BP: (!) 159/96 138/80  Pulse: 88   Temp: 98.7 F (37.1 C)   SpO2: 97%   Weight: (!) 319 lb (144.7 kg)   Height: '5\' 8"'  (1.727 m)    Body mass index is 48.5 kg/m.  Wt Readings from Last 3 Encounters:  01/24/20 (!) 319 lb (144.7 kg)  11/20/19 (!) 320 lb 3.2 oz (145.2 kg)  10/25/19 (!) 318 lb (144.2 kg)    Physical Exam Vitals and nursing note reviewed.  Constitutional:      Appearance: He is well-developed.  HENT:     Head: Normocephalic and atraumatic.  Eyes:     Conjunctiva/sclera: Conjunctivae normal.     Pupils: Pupils are equal, round, and reactive to light.  Cardiovascular:     Rate and Rhythm: Normal rate and regular rhythm.     Heart  sounds: No murmur. No friction rub. No gallop.   Pulmonary:     Effort: Pulmonary effort is normal.     Breath sounds: Normal breath sounds. No wheezing or rales.  Musculoskeletal:     Cervical back: Neck supple.  Skin:    General: Skin is warm and dry.  Neurological:     Mental Status: He is alert and oriented to person, place, and time.     No results found for this or any previous visit (  from the past 24 hour(s)).  No results found.   ASSESSMENT and PLAN  1. Type 2 diabetes mellitus without complication, without long-term current use of insulin (HCC) Concerning sx for mild hypoglycemia from irregular meal times while on glimperide. Changing to [place]. Discussed carb counting, snacks and mgt of hypoglycemia. Educational handout given. - Microalbumin / creatinine urine ratio - Hemoglobin A1c  2. Essential hypertension Controlled. Continue current regime.  - CMP14+EGFR  3. Pure hypercholesterolemia Checking labs today, medications will be adjusted as needed.  - Lipid panel  Other orders - sitaGLIPtin (JANUVIA) 100 MG tablet; Take 1 tablet (100 mg total) by mouth daily.  Return in about 3 months (around 04/24/2020).    Rutherford Guys, MD Primary Care at [place], [place] [address] Ph.  [phone] Fax [phone]

## 2020-01-24 NOTE — Telephone Encounter (Signed)
Patient is calling to report that the sitaGLIPtin (JANUVIA) 100 MG tablet [id number] is over 1,000 can another medication be sent. Please advise Cb- [facility] in [place].

## 2020-01-25 LAB — CMP14+EGFR
ALT: 13 IU/L (ref 0–44)
AST: 14 IU/L (ref 0–40)
Albumin/Globulin Ratio: 1.3 (ref 1.2–2.2)
Albumin: 4.2 g/dL (ref 3.8–4.9)
Alkaline Phosphatase: 94 IU/L (ref 39–117)
BUN/Creatinine Ratio: 9 (ref 9–20)
BUN: 10 mg/dL (ref 6–24)
Bilirubin Total: 0.6 mg/dL (ref 0.0–1.2)
CO2: 26 mmol/L (ref 20–29)
Calcium: 9.5 mg/dL (ref 8.7–10.2)
Chloride: 101 mmol/L (ref 96–106)
Creatinine, Ser: 1.15 mg/dL (ref 0.76–1.27)
GFR calc Af Amer: 82 mL/min/{1.73_m2} (ref >59)
GFR calc non Af Amer: 71 mL/min/{1.73_m2} (ref >59)
Globulin, Total: 3.2 g/dL (ref 1.5–4.5)
Glucose: 143 mg/dL — ABNORMAL HIGH (ref 65–99)
Potassium: 4.2 mmol/L (ref 3.5–5.2)
Sodium: 140 mmol/L (ref 134–144)
Total Protein: 7.4 g/dL (ref 6.0–8.5)

## 2020-01-25 LAB — HEMOGLOBIN A1C
Est. average glucose Bld gHb Est-mCnc: 151 mg/dL
Hgb A1c MFr Bld: 6.9 % — ABNORMAL HIGH (ref 4.8–5.6)

## 2020-01-25 LAB — LIPID PANEL
Chol/HDL Ratio: 3.4 ratio (ref 0.0–5.0)
Cholesterol, Total: 118 mg/dL (ref 100–199)
HDL: 35 mg/dL — ABNORMAL LOW (ref >39)
LDL Chol Calc (NIH): 67 mg/dL (ref 0–99)
Triglycerides: 81 mg/dL (ref 0–149)
VLDL Cholesterol Cal: 16 mg/dL (ref 5–40)

## 2020-01-25 LAB — MICROALBUMIN / CREATININE URINE RATIO
Creatinine, Urine: 94.7 mg/dL
Microalb/Creat Ratio: <3 mg/g creat (ref 0–29)
Microalbumin, Urine: <3 ug/mL

## 2020-02-06 ENCOUNTER — Other Ambulatory Visit: Payer: Self-pay

## 2020-02-06 DIAGNOSIS — E1165 Type 2 diabetes mellitus with hyperglycemia: Secondary | ICD-10-CM

## 2020-02-06 DIAGNOSIS — E119 Type 2 diabetes mellitus without complications: Secondary | ICD-10-CM

## 2020-02-06 MED ORDER — GLIMEPIRIDE 2 MG PO TABS: 2.0000 mg | ORAL_TABLET | Freq: Every day | ORAL | 1 refills | Status: DC

## 2020-02-06 MED ORDER — METFORMIN HCL ER 500 MG PO TB24: ORAL_TABLET | ORAL | 0 refills | Status: DC

## 2020-02-06 NOTE — Telephone Encounter (Signed)
Amaryl mg and metformin refills sent to pharmacy on file.

## 2020-02-21 DIAGNOSIS — C61 Malignant neoplasm of prostate: Secondary | ICD-10-CM | POA: Diagnosis not present

## 2020-02-28 DIAGNOSIS — C61 Malignant neoplasm of prostate: Secondary | ICD-10-CM | POA: Diagnosis not present

## 2020-02-28 DIAGNOSIS — R3915 Urgency of urination: Secondary | ICD-10-CM | POA: Diagnosis not present

## 2020-02-28 DIAGNOSIS — N5201 Erectile dysfunction due to arterial insufficiency: Secondary | ICD-10-CM | POA: Diagnosis not present

## 2020-03-09 ENCOUNTER — Other Ambulatory Visit: Payer: Self-pay | Admitting: Family Medicine

## 2020-03-09 DIAGNOSIS — E119 Type 2 diabetes mellitus without complications: Secondary | ICD-10-CM

## 2020-03-09 DIAGNOSIS — I10 Essential (primary) hypertension: Secondary | ICD-10-CM

## 2020-03-09 MED ORDER — AMLODIPINE BESYLATE 10 MG PO TABS: 10.0000 mg | ORAL_TABLET | Freq: Every day | ORAL | 1 refills | Status: DC

## 2020-03-09 MED ORDER — METFORMIN HCL ER 500 MG PO TB24: ORAL_TABLET | ORAL | 0 refills | Status: DC

## 2020-03-09 NOTE — Telephone Encounter (Signed)
Medication Refill - Medication: amLODipine (NORVASC) 10 MG tablet  metFORMIN (GLUCOPHAGE-XR) 500 MG 24 hr tablet    Has the patient contacted their pharmacy? Yes.   (Agent: If no, request that the patient contact the pharmacy for the refill.) (Agent: If yes, when and what did the pharmacy advise?)  Preferred Pharmacy (with phone number or street name):  [facility] [address] [place] [address]  Phone: [phone] Fax: [phone]     Agent: Please be advised that RX refills may take up to 3 business days. We ask that you follow-up with your pharmacy.

## 2020-04-24 ENCOUNTER — Ambulatory Visit: Payer: BC Managed Care – PPO | Admitting: Family Medicine

## 2020-04-24 ENCOUNTER — Other Ambulatory Visit: Payer: Self-pay

## 2020-04-24 ENCOUNTER — Encounter: Payer: Self-pay | Admitting: Family Medicine

## 2020-04-24 VITALS — BP 134/84 | HR 78 | Temp 98.1°F | Ht 68.0 in | Wt 320.0 lb

## 2020-04-24 DIAGNOSIS — Z23 Encounter for immunization: Secondary | ICD-10-CM | POA: Diagnosis not present

## 2020-04-24 DIAGNOSIS — I10 Essential (primary) hypertension: Secondary | ICD-10-CM

## 2020-04-24 DIAGNOSIS — E1165 Type 2 diabetes mellitus with hyperglycemia: Secondary | ICD-10-CM

## 2020-04-24 DIAGNOSIS — E7800 Pure hypercholesterolemia, unspecified: Secondary | ICD-10-CM | POA: Insufficient documentation

## 2020-04-24 HISTORY — DX: Pure hypercholesterolemia, unspecified: E78.00

## 2020-04-24 MED ORDER — GLIMEPIRIDE 2 MG PO TABS: 2.0000 mg | ORAL_TABLET | Freq: Every day | ORAL | 1 refills | Status: DC

## 2020-04-24 MED ORDER — ROSUVASTATIN CALCIUM 5 MG PO TABS: 5.0000 mg | ORAL_TABLET | Freq: Every day | ORAL | 3 refills | Status: DC

## 2020-04-24 MED ORDER — METFORMIN HCL ER 500 MG PO TB24: ORAL_TABLET | ORAL | 1 refills | Status: DC

## 2020-04-24 NOTE — Patient Instructions (Signed)
° ° ° °  If you have lab work done today you will be contacted with your lab results within the next 2 weeks.  If you have not heard from us then please contact us. The fastest way to get your results is to register for My Chart. ° ° °IF you received an x-ray today, you will receive an invoice from [facility]. Please contact [facility] at [phone] with questions or concerns regarding your invoice.  ° °IF you received labwork today, you will receive an invoice from [facility]. Please contact [facility] at [phone] with questions or concerns regarding your invoice.  ° °Our billing staff will not be able to assist you with questions regarding bills from these companies. ° °You will be contacted with the lab results as soon as they are available. The fastest way to get your results is to activate your My Chart account. Instructions are located on the last page of this paperwork. If you have not heard from us regarding the results in 2 weeks, please contact this office. °  ° ° ° °

## 2020-04-24 NOTE — Progress Notes (Signed)
7/30/20218:24 AM  Robert Mcintyre 1966/07/12, 54 y.o., male [id number]  Chief Complaint  Patient presents with  . Diabetes  . Medication Refill  . Hypertension    wants to talk about the amlodipine med, is the best med to take for bp?    HPI:   Patient is a 54 y.o. male with past medical history significant for HTN, prostate cancer, morbid obesity, diabetes, DDD s/p laminectomy, colonic polyps, asthmawho presents for followup  Last OV April 2021 - changed to [place] but too expensive so changed back to glimepiride  Overall doing well Has not been checking cbgs very often Denies any symptoms of hypoglycemia He takes metformin 1028m and glimperide 262mday He is trying to drink more water "I have my good days and bad days" re diet Getting a bit lightheaded in this heat Sees urologist this dec, he started him on meds for UI, he will be checking testosterone insurance only covers generic medications He has completed covid vaccines  Wt Readings from Last 3 Encounters:  04/24/20 (!) 320 lb (145.2 kg)  01/24/20 (!) 319 lb (144.7 kg)  11/20/19 (!) 320 lb 3.2 oz (145.2 kg)   BP Readings from Last 3 Encounters:  04/24/20 (!) 134/84  01/24/20 138/80  11/20/19 (!) 150/80    Lab Results  Component Value Date   HGBA1C 6.9 (H) 01/24/2020   HGBA1C 6.8 (A) 09/13/2019   HGBA1C 11.0 (A) 06/13/2019   Lab Results  Component Value Date   LDLCALC 67 01/24/2020   CREATININE 1.15 01/24/2020    Depression screen PHQ 2/9 01/24/2020 10/25/2019 09/13/2019  Decreased Interest 0 0 0  Down, Depressed, Hopeless 0 0 0  PHQ - 2 Score 0 0 0    Fall Risk  04/24/2020 01/24/2020 10/25/2019 09/13/2019 06/13/2019  Falls in the past year? 0 0 0 0 0  Number falls in past yr: 0 0 0 0 0  Injury with Fall? 0 0 0 0 0  Follow up - Falls evaluation completed - - -     Allergies  Allergen Reactions  . Eggs Or Egg-Derived Products Nausea And Vomiting    Prior to Admission medications   Medication  Sig Start Date End Date Taking? Authorizing Provider  albuterol (PROAIR HFA) 108 (90 Base) MCG/ACT inhaler Inhale 2 puffs into the lungs every 6 (six) hours as needed for wheezing or shortness of breath. 09/19/19  Yes SaRutherford GuysMD  amLODipine (NORVASC) 10 MG tablet Take 1 tablet (10 mg total) by mouth daily. 03/09/20  Yes SaRutherford GuysMD  Bepotastine Besilate (BEPREVE) 1.5 % SOLN Place 1 drop into both eyes 2 (two) times daily as needed. 11/20/19  Yes Padgett, ShRae HalstedMD  blood glucose meter kit and supplies Per insurance preference. Check blood glucose once a day. Dx E11.65 06/13/19  Yes SaRutherford GuysMD  budesonide-formoterol ([facility]160-4.5 MCG/ACT inhaler Inhale 2 puffs into the lungs 2 (two) times daily. 11/20/19  Yes Padgett, ShRae HalstedMD  glimepiride (AMARYL) 2 MG tablet Take 1 tablet (2 mg total) by mouth daily with breakfast. 02/06/20  Yes SaRutherford GuysMD  metFORMIN (GLUCOPHAGE-XR) 500 MG 24 hr tablet TAKE 2 TABLETS BY MOUTH ONCE DAILY WITH BREAKFAST 03/09/20  Yes SaRutherford GuysMD  rosuvastatin (CRESTOR) 5 MG tablet Take 1 tablet (5 mg total) by mouth at bedtime. 02/15/19  Yes SaRutherford GuysMD  traZODone (DESYREL) 50 MG tablet Take 0.5-1 tablets (25-50 mg total) by mouth at  bedtime as needed for sleep. 09/13/19  Yes Rutherford Guys, MD    Past Medical History:  Diagnosis Date  . Arthritis   . Asthma   . Hypertension   . Morbid obesity ([place])   . Periumbilical hernia    pt unaware  . Prostate cancer ([place])   . Spondylolysis, lumbar region     Past Surgical History:  Procedure Laterality Date  . COLONOSCOPY N/A 09/16/2016   Procedure: COLONOSCOPY;  Surgeon: Danie Binder, MD;  [facility];  Service: Endoscopy;  Laterality: N/A;  1:00 PM  . LYMPHADENECTOMY Bilateral 04/09/2018   Procedure: LYMPHADENECTOMY;  Surgeon: Raynelle Bring, MD;  [facility];  Service: Urology;  Laterality: Bilateral;  . POLYPECTOMY  09/16/2016    Procedure: POLYPECTOMY;  Surgeon: Danie Binder, MD;  [facility];  Service: Endoscopy;;  ascending colon polyp, hepatic flexure polyp, descending colon polyp,   . ROBOT ASSISTED LAPAROSCOPIC RADICAL PROSTATECTOMY N/A 04/09/2018   Procedure: XI ROBOTIC ASSISTED LAPAROSCOPIC RADICAL PROSTATECTOMY LEVEL 3;  Surgeon: Raynelle Bring, MD;  [facility];  Service: Urology;  Laterality: N/A;  ONLY NEEDS 210 MIN FOR ALL PROCEDURES  . SPINE SURGERY  05/1997   back fusion    Social History   Tobacco Use  . Smoking status: Former Smoker    Packs/day: 0.25    Years: 3.00    Pack years: 0.75    Types: Cigarettes    Quit date: 1980    Years since quitting: 41.6  . Smokeless tobacco: Never Used  Substance Use Topics  . Alcohol use: Yes    Comment: occasional    Family History  Problem Relation Age of Onset  . Heart disease Mother   . Stroke Mother   . Hypertension Mother   . Diabetes Mother   . Asthma Father   . Hypertension Brother     Review of Systems  Constitutional: Negative for chills and fever.  Respiratory: Negative for cough and shortness of breath.   Cardiovascular: Negative for chest pain, palpitations and leg swelling.  Gastrointestinal: Negative for abdominal pain, nausea and vomiting.  per hpi   OBJECTIVE:  Today's Vitals   04/24/20 0817  BP: (!) 134/84  Pulse: 78  Temp: 98.1 F (36.7 C)  SpO2: 96%  Weight: (!) 320 lb (145.2 kg)  Height: '5\' 8"'  (1.727 m)   Body mass index is 48.66 kg/m.   Physical Exam Vitals and nursing note reviewed.  Constitutional:      Appearance: He is well-developed.  HENT:     Head: Normocephalic and atraumatic.  Eyes:     Extraocular Movements: Extraocular movements intact.     Conjunctiva/sclera: Conjunctivae normal.     Pupils: Pupils are equal, round, and reactive to light.  Cardiovascular:     Rate and Rhythm: Normal rate and regular rhythm.     Heart sounds: No murmur heard.  No friction rub. No gallop.    Pulmonary:     Effort: Pulmonary effort is normal.     Breath sounds: Normal breath sounds. No wheezing, rhonchi or rales.  Musculoskeletal:     Cervical back: Neck supple.  Skin:    General: Skin is warm and dry.  Neurological:     Mental Status: He is alert and oriented to person, place, and time.     No results found for this or any previous visit (from the past 24 hour(s)).  No results found.   ASSESSMENT and PLAN  1. Type  2 diabetes mellitus with hyperglycemia, without long-term current use of insulin ([place]) Checking labs today, medications will be adjusted as needed. Consider increasing metformin. - metFORMIN (GLUCOPHAGE-XR) 500 MG 24 hr tablet; TAKE 2 TABLETS BY MOUTH ONCE DAILY WITH BREAKFAST - glimepiride (AMARYL) 2 MG tablet; Take 1 tablet (2 mg total) by mouth daily with breakfast. - Comprehensive metabolic panel - Fructosamine  2. Essential hypertension Controlled. Continue current regime.   3. Pure hypercholesterolemia Checking labs today, medications will be adjusted as needed.   4. Need for vaccination - Pneumococcal polysaccharide vaccine 23-valent greater than or equal to 2yo subcutaneous/IM  Other orders - rosuvastatin (CRESTOR) 5 MG tablet; Take 1 tablet (5 mg total) by mouth at bedtime.  Return in about 3 months (around 07/25/2020).    Rutherford Guys, MD Primary Care at [place], [place] [address] Ph.  [phone] Fax [phone]

## 2020-04-25 LAB — COMPREHENSIVE METABOLIC PANEL
ALT: 13 IU/L (ref 0–44)
AST: 11 IU/L (ref 0–40)
Albumin/Globulin Ratio: 1.3 (ref 1.2–2.2)
Albumin: 4.2 g/dL (ref 3.8–4.9)
Alkaline Phosphatase: 91 IU/L (ref 48–121)
BUN/Creatinine Ratio: 9 (ref 9–20)
BUN: 10 mg/dL (ref 6–24)
Bilirubin Total: 0.7 mg/dL (ref 0.0–1.2)
CO2: 25 mmol/L (ref 20–29)
Calcium: 9.4 mg/dL (ref 8.7–10.2)
Chloride: 102 mmol/L (ref 96–106)
Creatinine, Ser: 1.09 mg/dL (ref 0.76–1.27)
GFR calc Af Amer: 87 mL/min/{1.73_m2} (ref >59)
GFR calc non Af Amer: 75 mL/min/{1.73_m2} (ref >59)
Globulin, Total: 3.2 g/dL (ref 1.5–4.5)
Glucose: 131 mg/dL — ABNORMAL HIGH (ref 65–99)
Potassium: 4.4 mmol/L (ref 3.5–5.2)
Sodium: 140 mmol/L (ref 134–144)
Total Protein: 7.4 g/dL (ref 6.0–8.5)

## 2020-04-25 LAB — FRUCTOSAMINE: Fructosamine: 239 umol/L (ref 0–285)

## 2020-10-05 ENCOUNTER — Telehealth: Payer: Self-pay

## 2020-10-06 ENCOUNTER — Other Ambulatory Visit: Payer: Self-pay

## 2020-10-06 DIAGNOSIS — E1165 Type 2 diabetes mellitus with hyperglycemia: Secondary | ICD-10-CM

## 2020-10-06 MED ORDER — ALBUTEROL SULFATE HFA 108 (90 BASE) MCG/ACT IN AERS: 2.0000 | INHALATION_SPRAY | Freq: Four times a day (QID) | RESPIRATORY_TRACT | 0 refills | Status: DC | PRN

## 2020-10-06 MED ORDER — METFORMIN HCL ER 500 MG PO TB24: ORAL_TABLET | ORAL | 0 refills | Status: DC

## 2020-10-06 NOTE — Telephone Encounter (Signed)
Called pt and sch appt for 10/14/20

## 2020-10-12 ENCOUNTER — Telehealth: Payer: Self-pay

## 2020-10-12 ENCOUNTER — Other Ambulatory Visit: Payer: Self-pay

## 2020-10-12 DIAGNOSIS — I10 Essential (primary) hypertension: Secondary | ICD-10-CM

## 2020-10-12 MED ORDER — AMLODIPINE BESYLATE 10 MG PO TABS: 10.0000 mg | ORAL_TABLET | Freq: Every day | ORAL | 1 refills | Status: DC

## 2020-10-12 NOTE — Telephone Encounter (Signed)
noted 

## 2020-10-12 NOTE — Telephone Encounter (Signed)
Pt has med refill appt for 1/19 and we can not sch toc per management.

## 2020-10-14 ENCOUNTER — Ambulatory Visit: Payer: BC Managed Care – PPO | Admitting: Registered Nurse

## 2020-10-16 ENCOUNTER — Other Ambulatory Visit: Payer: Self-pay

## 2020-10-16 ENCOUNTER — Ambulatory Visit (INDEPENDENT_AMBULATORY_CARE_PROVIDER_SITE_OTHER): Payer: 59 | Admitting: Nurse Practitioner

## 2020-10-16 ENCOUNTER — Encounter: Payer: Self-pay | Admitting: Nurse Practitioner

## 2020-10-16 VITALS — BP 168/102 | HR 92 | Temp 98.3°F | Resp 20 | Ht 68.0 in | Wt 300.0 lb

## 2020-10-16 DIAGNOSIS — E785 Hyperlipidemia, unspecified: Secondary | ICD-10-CM | POA: Diagnosis not present

## 2020-10-16 DIAGNOSIS — E1165 Type 2 diabetes mellitus with hyperglycemia: Secondary | ICD-10-CM | POA: Diagnosis not present

## 2020-10-16 DIAGNOSIS — Z7689 Persons encountering health services in other specified circumstances: Secondary | ICD-10-CM

## 2020-10-16 DIAGNOSIS — I10 Essential (primary) hypertension: Secondary | ICD-10-CM

## 2020-10-16 DIAGNOSIS — J069 Acute upper respiratory infection, unspecified: Secondary | ICD-10-CM

## 2020-10-16 DIAGNOSIS — G4700 Insomnia, unspecified: Secondary | ICD-10-CM

## 2020-10-16 MED ORDER — BENZONATATE 100 MG PO CAPS: 100.0000 mg | ORAL_CAPSULE | Freq: Two times a day (BID) | ORAL | 0 refills | Status: DC | PRN

## 2020-10-16 MED ORDER — NOREL AD 4-10-325 MG PO TABS: 1.0000 | ORAL_TABLET | ORAL | 1 refills | Status: DC | PRN

## 2020-10-16 MED ORDER — AMOXICILLIN-POT CLAVULANATE 875-125 MG PO TABS: 1.0000 | ORAL_TABLET | Freq: Two times a day (BID) | ORAL | 0 refills | Status: DC

## 2020-10-16 MED ORDER — OLMESARTAN MEDOXOMIL 20 MG PO TABS: 10.0000 mg | ORAL_TABLET | Freq: Every day | ORAL | 1 refills | Status: DC

## 2020-10-16 MED ORDER — METFORMIN HCL ER 500 MG PO TB24: ORAL_TABLET | ORAL | 1 refills | Status: DC

## 2020-10-16 NOTE — Patient Instructions (Signed)
It was nice meeting you today.  We will meet back up in 2 weeks to discuss your lab work.  I also called in prescriptions for your respiratory infection.

## 2020-10-16 NOTE — Assessment & Plan Note (Signed)
-  BP elevated today at 168/102 -taking amlodipine 10 mg daily -Rx. Olmesartan; also protects kidneys

## 2020-10-16 NOTE — Assessment & Plan Note (Signed)
-  transferring from [place] -will get labs today as he is fasting

## 2020-10-16 NOTE — Assessment & Plan Note (Addendum)
-  no A1c to review today -takes metformin XR 1000 mg BID -takes glimepiride 2 mg daily -Rx. olmesartan for BP and renal protection; already taking statin

## 2020-10-16 NOTE — Progress Notes (Signed)
Acute Office Visit  Subjective:    Patient ID: Robert Mcintyre, male    DOB: Oct 23, 1965, 55 y.o.   MRN: [id number]  Chief Complaint  Patient presents with  . New Patient (Initial Visit)  . Cough    X 1week   . Shortness of Breath    X 1 week     HPI Patient is in today for new patient/sick visit. Transferring care from [facility] in [place] with [facility].  His doctor left, and the practice was not accepting patient. Last physical was over a year ago, but he has yearly DOT physicals. Last labs were several months ago. He is seeing a urologist every 6 months for prostate cancer check. He is seeing Dr. Alinda Money.  Past Medical History:  Diagnosis Date  . Arthritis    back (had surgery 98), knees  . Asthma    uses albuterol during cold weather  . Hypertension   . Morbid obesity ([place])   . Periumbilical hernia    pt unaware  . Prostate cancer ([place])    prostate cancer around 2018, had surgery  . Pure hypercholesterolemia 04/24/2020  . Spondylolysis, lumbar region     Past Surgical History:  Procedure Laterality Date  . COLONOSCOPY N/A 09/16/2016   Procedure: COLONOSCOPY;  Surgeon: Danie Binder, MD;  [facility];  Service: Endoscopy;  Laterality: N/A;  1:00 PM  . LYMPHADENECTOMY Bilateral 04/09/2018   Procedure: LYMPHADENECTOMY;  Surgeon: Raynelle Bring, MD;  Location: [facility];  Service: Urology;  Laterality: Bilateral;  . POLYPECTOMY  09/16/2016   Procedure: POLYPECTOMY;  Surgeon: Danie Binder, MD;  [facility];  Service: Endoscopy;;  ascending colon polyp, hepatic flexure polyp, descending colon polyp,   . ROBOT ASSISTED LAPAROSCOPIC RADICAL PROSTATECTOMY N/A 04/09/2018   Procedure: XI ROBOTIC ASSISTED LAPAROSCOPIC RADICAL PROSTATECTOMY LEVEL 3;  Surgeon: Raynelle Bring, MD;  Location: [facility];  Service: Urology;  Laterality: N/A;  ONLY NEEDS 210 MIN FOR ALL PROCEDURES  . SPINE SURGERY  05/1997   back fusion    Family History  Problem Relation  Age of Onset  . Heart disease Mother   . Stroke Mother   . Hypertension Mother   . Diabetes Mother   . Asthma Father   . Hypertension Brother     Social History   Socioeconomic History  . Marital status: Single    Spouse name: Not on file  . Number of children: 0  . Years of education: 56  . Highest education level: Not on file  Occupational History  . Occupation: truck Geophysicist/field seismologist  Tobacco Use  . Smoking status: Former Smoker    Packs/day: 0.25    Years: 3.00    Pack years: 0.75    Types: Cigarettes    Quit date: 1980    Years since quitting: 42.0  . Smokeless tobacco: Never Used  Vaping Use  . Vaping Use: Never used  Substance and Sexual Activity  . Alcohol use: Yes    Comment: occasional; beer and some liquor; 6 pack last several weeks and moonshine lasts months  . Drug use: No  . Sexual activity: Not Currently  Other Topics Concern  . Not on file  Social History Narrative   Lives with brother   Social Determinants of Health   Financial Resource Strain: Not on file  Food Insecurity: Not on file  Transportation Needs: Not on file  Physical Activity: Not on file  Stress: Not on file  Social Connections:  Not on file  Intimate Partner Violence: Not on file    Outpatient Medications Prior to Visit  Medication Sig Dispense Refill  . albuterol (PROAIR HFA) 108 (90 Base) MCG/ACT inhaler Inhale 2 puffs into the lungs every 6 (six) hours as needed for wheezing or shortness of breath. 18 g 0  . amLODipine (NORVASC) 10 MG tablet Take 1 tablet (10 mg total) by mouth daily. 30 tablet 1  . Bepotastine Besilate (BEPREVE) 1.5 % SOLN Place 1 drop into both eyes 2 (two) times daily as needed. 10 mL 5  . blood glucose meter kit and supplies Per insurance preference. Check blood glucose once a day. Dx E11.65 1 each 11  . budesonide-formoterol (SYMBICORT) 160-4.5 MCG/ACT inhaler Inhale 2 puffs into the lungs 2 (two) times daily. 1 Inhaler 5  . glimepiride (AMARYL) 2 MG tablet Take 1  tablet (2 mg total) by mouth daily with breakfast. 90 tablet 1  . rosuvastatin (CRESTOR) 5 MG tablet Take 1 tablet (5 mg total) by mouth at bedtime. 90 tablet 3  . traZODone (DESYREL) 50 MG tablet Take 0.5-1 tablets (25-50 mg total) by mouth at bedtime as needed for sleep. 30 tablet 3  . metFORMIN (GLUCOPHAGE-XR) 500 MG 24 hr tablet TAKE 2 TABLETS BY MOUTH ONCE DAILY WITH BREAKFAST 30 tablet 0   No facility-administered medications prior to visit.    Allergies  Allergen Reactions  . Eggs Or Egg-Derived Products Nausea And Vomiting    Review of Systems  Constitutional: Positive for fatigue. Negative for chills and fever.  HENT: Positive for congestion and sinus pressure. Negative for rhinorrhea, sinus pain and sore throat.   Respiratory: Positive for cough and shortness of breath. Negative for wheezing.   Cardiovascular: Negative.   Gastrointestinal: Negative for abdominal pain, blood in stool, constipation and diarrhea.       Objective:    Physical Exam Constitutional:      Appearance: He is obese.  HENT:     Mouth/Throat:     Mouth: Mucous membranes are moist.     Pharynx: Oropharynx is clear.  Cardiovascular:     Rate and Rhythm: Normal rate and regular rhythm.     Pulses: Normal pulses.  Pulmonary:     Effort: Pulmonary effort is normal.     Breath sounds: Normal breath sounds.  Musculoskeletal:     Cervical back: Normal range of motion and neck supple.  Neurological:     Mental Status: He is alert.     BP (!) 168/102   Pulse 92   Temp 98.3 F (36.8 C)   Resp 20   Ht '5\' 8"'  (1.727 m)   Wt 300 lb (136.1 kg)   SpO2 95%   BMI 45.61 kg/m  Wt Readings from Last 3 Encounters:  10/16/20 300 lb (136.1 kg)  04/24/20 (!) 320 lb (145.2 kg)  01/24/20 (!) 319 lb (144.7 kg)    Health Maintenance Due  Topic Date Due  . OPHTHALMOLOGY EXAM  Never done  . INFLUENZA VACCINE  04/26/2020  . COVID-19 Vaccine (3 - Moderna risk 4-dose series) 05/02/2020  . FOOT EXAM   06/12/2020  . HEMOGLOBIN A1C  07/25/2020    There are no preventive care reminders to display for this patient.   Lab Results  Component Value Date   TSH 1.380 09/13/2019   Lab Results  Component Value Date   WBC 8.1 09/13/2019   HGB 14.3 09/13/2019   HCT 42.9 09/13/2019   MCV 90 09/13/2019  PLT 310 09/13/2019   Lab Results  Component Value Date   NA 140 04/24/2020   K 4.4 04/24/2020   CO2 25 04/24/2020   GLUCOSE 131 (H) 04/24/2020   BUN 10 04/24/2020   CREATININE 1.09 04/24/2020   BILITOT 0.7 04/24/2020   ALKPHOS 91 04/24/2020   AST 11 04/24/2020   ALT 13 04/24/2020   PROT 7.4 04/24/2020   ALBUMIN 4.2 04/24/2020   CALCIUM 9.4 04/24/2020   ANIONGAP 9 04/06/2018   Lab Results  Component Value Date   CHOL 118 01/24/2020   Lab Results  Component Value Date   HDL 35 (L) 01/24/2020   Lab Results  Component Value Date   LDLCALC 67 01/24/2020   Lab Results  Component Value Date   TRIG 81 01/24/2020   Lab Results  Component Value Date   CHOLHDL 3.4 01/24/2020   Lab Results  Component Value Date   HGBA1C 6.9 (H) 01/24/2020       Assessment & Plan:   Problem List Items Addressed This Visit      Cardiovascular and Mediastinum   Essential hypertension    -BP elevated today at 168/102 -taking amlodipine 10 mg daily -Rx. Olmesartan; also protects kidneys      Relevant Medications   olmesartan (BENICAR) 20 MG tablet   Other Relevant Orders   CBC with Differential/Platelet   CMP14+EGFR     Respiratory   URI (upper respiratory infection)    -ongoing x 1 week -suggested home covid test as covid clinic is closed today -Rx. augmentin -Rx. norel -Rx. Tessalon perles        Endocrine   Type II diabetes mellitus, uncontrolled ([place])    -no A1c to review today -takes metformin XR 1000 mg BID -takes glimepiride 2 mg daily -Rx. olmesartan for BP and renal protection; already taking statin      Relevant Medications   olmesartan (BENICAR) 20 MG  tablet   metFORMIN (GLUCOPHAGE-XR) 500 MG 24 hr tablet   Other Relevant Orders   Hemoglobin A1c   Lipid Panel With LDL/HDL Ratio     Other   Hyperlipidemia    -taking rosuvastatin 5 mg daily -no labs to review today      Relevant Medications   olmesartan (BENICAR) 20 MG tablet   Insomnia, unspecified    -takes trazodone 25-50 mg qhs PRN -no issues today       Other Visit Diagnoses    Encounter to establish care    -  Primary   Relevant Orders   CBC with Differential/Platelet   CMP14+EGFR   Hemoglobin A1c   Lipid Panel With LDL/HDL Ratio   Type 2 diabetes mellitus with hyperglycemia, without long-term current use of insulin (HCC)       Relevant Medications   olmesartan (BENICAR) 20 MG tablet   metFORMIN (GLUCOPHAGE-XR) 500 MG 24 hr tablet       Meds ordered this encounter  Medications  . Chlorphen-PE-Acetaminophen (NOREL AD) 4-10-325 MG TABS    Sig: Take 1 tablet by mouth every 4 (four) hours as needed (nasal congestion, cold symptoms).    Dispense:  20 tablet    Refill:  1  . amoxicillin-clavulanate (AUGMENTIN) 875-125 MG tablet    Sig: Take 1 tablet by mouth 2 (two) times daily.    Dispense:  20 tablet    Refill:  0  . benzonatate (TESSALON) 100 MG capsule    Sig: Take 1 capsule (100 mg total) by mouth 2 (two) times daily as  needed for cough.    Dispense:  20 capsule    Refill:  0  . olmesartan (BENICAR) 20 MG tablet    Sig: Take 0.5 tablets (10 mg total) by mouth daily.    Dispense:  15 tablet    Refill:  1  . metFORMIN (GLUCOPHAGE-XR) 500 MG 24 hr tablet    Sig: TAKE 2 TABLETS BY MOUTH ONCE DAILY WITH BREAKFAST    Dispense:  360 tablet    Refill:  [place], NP

## 2020-10-16 NOTE — Assessment & Plan Note (Signed)
-  takes trazodone 25-50 mg qhs PRN -no issues today

## 2020-10-16 NOTE — Assessment & Plan Note (Signed)
-  ongoing x 1 week -suggested home covid test as covid clinic is closed today -Rx. augmentin -Rx. norel -Rx. Tessalon perles

## 2020-10-16 NOTE — Assessment & Plan Note (Signed)
-  taking rosuvastatin 5 mg daily -no labs to review today

## 2020-10-30 ENCOUNTER — Ambulatory Visit: Payer: 59 | Admitting: Nurse Practitioner

## 2020-10-31 LAB — CMP14+EGFR
ALT: 13 IU/L (ref 0–44)
AST: 11 IU/L (ref 0–40)
Albumin/Globulin Ratio: 1.5 (ref 1.2–2.2)
Albumin: 4.1 g/dL (ref 3.8–4.9)
Alkaline Phosphatase: 94 IU/L (ref 44–121)
BUN/Creatinine Ratio: 9 (ref 9–20)
BUN: 9 mg/dL (ref 6–24)
Bilirubin Total: 0.5 mg/dL (ref 0.0–1.2)
CO2: 26 mmol/L (ref 20–29)
Calcium: 8.9 mg/dL (ref 8.7–10.2)
Chloride: 103 mmol/L (ref 96–106)
Creatinine, Ser: 1.05 mg/dL (ref 0.76–1.27)
GFR calc Af Amer: 91 mL/min/{1.73_m2} (ref >59)
GFR calc non Af Amer: 79 mL/min/{1.73_m2} (ref >59)
Globulin, Total: 2.8 g/dL (ref 1.5–4.5)
Glucose: 112 mg/dL — ABNORMAL HIGH (ref 65–99)
Potassium: 4.6 mmol/L (ref 3.5–5.2)
Sodium: 141 mmol/L (ref 134–144)
Total Protein: 6.9 g/dL (ref 6.0–8.5)

## 2020-10-31 LAB — CBC WITH DIFFERENTIAL/PLATELET
Basophils Absolute: 0 10*3/uL (ref 0.0–0.2)
Basos: 0 %
EOS (ABSOLUTE): 0.3 10*3/uL (ref 0.0–0.4)
Eos: 4 %
Hematocrit: 42.1 % (ref 37.5–51.0)
Hemoglobin: 14 g/dL (ref 13.0–17.7)
Immature Grans (Abs): 0 10*3/uL (ref 0.0–0.1)
Immature Granulocytes: 0 %
Lymphocytes Absolute: 2.2 10*3/uL (ref 0.7–3.1)
Lymphs: 29 %
MCH: 30 pg (ref 26.6–33.0)
MCHC: 33.3 g/dL (ref 31.5–35.7)
MCV: 90 fL (ref 79–97)
Monocytes Absolute: 0.8 10*3/uL (ref 0.1–0.9)
Monocytes: 11 %
Neutrophils Absolute: 4.2 10*3/uL (ref 1.4–7.0)
Neutrophils: 56 %
Platelets: 294 10*3/uL (ref 150–450)
RBC: 4.67 x10E6/uL (ref 4.14–5.80)
RDW: 12.4 % (ref 11.6–15.4)
WBC: 7.6 10*3/uL (ref 3.4–10.8)

## 2020-10-31 LAB — LIPID PANEL WITH LDL/HDL RATIO
Cholesterol, Total: 133 mg/dL (ref 100–199)
HDL: 38 mg/dL — ABNORMAL LOW (ref >39)
LDL Chol Calc (NIH): 83 mg/dL (ref 0–99)
LDL/HDL Ratio: 2.2 ratio (ref 0.0–3.6)
Triglycerides: 58 mg/dL (ref 0–149)
VLDL Cholesterol Cal: 12 mg/dL (ref 5–40)

## 2020-10-31 LAB — HEMOGLOBIN A1C
Est. average glucose Bld gHb Est-mCnc: 160 mg/dL
Hgb A1c MFr Bld: 7.2 % — ABNORMAL HIGH (ref 4.8–5.6)

## 2020-11-02 ENCOUNTER — Other Ambulatory Visit: Payer: Self-pay | Admitting: Nurse Practitioner

## 2020-11-02 DIAGNOSIS — E1165 Type 2 diabetes mellitus with hyperglycemia: Secondary | ICD-10-CM

## 2020-11-02 MED ORDER — METFORMIN HCL ER 500 MG PO TB24: 1000.0000 mg | ORAL_TABLET | Freq: Two times a day (BID) | ORAL | 1 refills | Status: DC

## 2020-11-02 NOTE — Progress Notes (Signed)
His A1c is 7.2% which is above the goal of 7.0. His medication list shows that he is taking 1000 mg of metformin in the AM, so this was increased to 100 mg (or 2 tabs) twice a day.

## 2020-11-05 ENCOUNTER — Other Ambulatory Visit: Payer: Self-pay

## 2020-11-05 ENCOUNTER — Telehealth (INDEPENDENT_AMBULATORY_CARE_PROVIDER_SITE_OTHER): Payer: 59 | Admitting: Nurse Practitioner

## 2020-11-05 ENCOUNTER — Encounter: Payer: Self-pay | Admitting: Nurse Practitioner

## 2020-11-05 DIAGNOSIS — E1165 Type 2 diabetes mellitus with hyperglycemia: Secondary | ICD-10-CM | POA: Diagnosis not present

## 2020-11-05 DIAGNOSIS — I10 Essential (primary) hypertension: Secondary | ICD-10-CM

## 2020-11-05 MED ORDER — METFORMIN HCL ER 500 MG PO TB24: 500.0000 mg | ORAL_TABLET | Freq: Three times a day (TID) | ORAL | 1 refills | Status: DC

## 2020-11-05 NOTE — Progress Notes (Signed)
Acute Office Visit  Subjective:    Patient ID: Robert Mcintyre, male    DOB: 1965/11/05, 55 y.o.   MRN: [id number]  Chief Complaint  Patient presents with  . Follow-up  . Hypertension  . Diabetes    HPI Patient is in today for medication check. At his last visit he had elevated BP (168/102), so he was started on olmesartan.  He also had a URI and was prescribed augmentin, norel, and tessalon perles.  He states he did not pick up norel, but he is feeling much better.  He states his BP was 138/60. No adverse medication effects.  At the last OV, we increased his metformin from 100 mg to 200 mg per day. He would like to cut back his dose   Past Medical History:  Diagnosis Date  . Arthritis    back (had surgery 98), knees  . Asthma    uses albuterol during cold weather  . Hypertension   . Morbid obesity ([place])   . Periumbilical hernia    pt unaware  . Prostate cancer ([place])    prostate cancer around 2018, had surgery  . Pure hypercholesterolemia 04/24/2020  . Spondylolysis, lumbar region     Past Surgical History:  Procedure Laterality Date  . COLONOSCOPY N/A 09/16/2016   Procedure: COLONOSCOPY;  Surgeon: Danie Binder, MD;  [facility];  Service: Endoscopy;  Laterality: N/A;  1:00 PM  . LYMPHADENECTOMY Bilateral 04/09/2018   Procedure: LYMPHADENECTOMY;  Surgeon: Raynelle Bring, MD;  [facility];  Service: Urology;  Laterality: Bilateral;  . POLYPECTOMY  09/16/2016   Procedure: POLYPECTOMY;  Surgeon: Danie Binder, MD;  [facility];  Service: Endoscopy;;  ascending colon polyp, hepatic flexure polyp, descending colon polyp,   . ROBOT ASSISTED LAPAROSCOPIC RADICAL PROSTATECTOMY N/A 04/09/2018   Procedure: XI ROBOTIC ASSISTED LAPAROSCOPIC RADICAL PROSTATECTOMY LEVEL 3;  Surgeon: Raynelle Bring, MD;  [facility];  Service: Urology;  Laterality: N/A;  ONLY NEEDS 210 MIN FOR ALL PROCEDURES  . SPINE SURGERY  05/1997   back fusion    Family History   Problem Relation Age of Onset  . Heart disease Mother   . Stroke Mother   . Hypertension Mother   . Diabetes Mother   . Asthma Father   . Hypertension Brother     Social History   Socioeconomic History  . Marital status: Single    Spouse name: Not on file  . Number of children: 0  . Years of education: 37  . Highest education level: Not on file  Occupational History  . Occupation: truck Geophysicist/field seismologist  Tobacco Use  . Smoking status: Former Smoker    Packs/day: 0.25    Years: 3.00    Pack years: 0.75    Types: Cigarettes    Quit date: 1980    Years since quitting: 42.1  . Smokeless tobacco: Never Used  Vaping Use  . Vaping Use: Never used  Substance and Sexual Activity  . Alcohol use: Yes    Comment: occasional; beer and some liquor; 6 pack last several weeks and moonshine lasts months  . Drug use: No  . Sexual activity: Not Currently  Other Topics Concern  . Not on file  Social History Narrative   Lives with brother   Social Determinants of Health   Financial Resource Strain: Not on file  Food Insecurity: Not on file  Transportation Needs: Not on file  Physical Activity: Not on file  Stress: Not  on file  Social Connections: Not on file  Intimate Partner Violence: Not on file    Outpatient Medications Prior to Visit  Medication Sig Dispense Refill  . albuterol (PROAIR HFA) 108 (90 Base) MCG/ACT inhaler Inhale 2 puffs into the lungs every 6 (six) hours as needed for wheezing or shortness of breath. 18 g 0  . amLODipine (NORVASC) 10 MG tablet Take 1 tablet (10 mg total) by mouth daily. 30 tablet 1  . Bepotastine Besilate (BEPREVE) 1.5 % SOLN Place 1 drop into both eyes 2 (two) times daily as needed. 10 mL 5  . blood glucose meter kit and supplies Per insurance preference. Check blood glucose once a day. Dx E11.65 1 each 11  . budesonide-formoterol (SYMBICORT) 160-4.5 MCG/ACT inhaler Inhale 2 puffs into the lungs 2 (two) times daily. 1 Inhaler 5  . glimepiride (AMARYL)  2 MG tablet Take 1 tablet (2 mg total) by mouth daily with breakfast. 90 tablet 1  . metFORMIN (GLUCOPHAGE-XR) 500 MG 24 hr tablet Take 2 tablets (1,000 mg total) by mouth in the morning and at bedtime. 360 each 1  . olmesartan (BENICAR) 20 MG tablet Take 0.5 tablets (10 mg total) by mouth daily. 15 tablet 1  . rosuvastatin (CRESTOR) 5 MG tablet Take 1 tablet (5 mg total) by mouth at bedtime. 90 tablet 3  . traZODone (DESYREL) 50 MG tablet Take 0.5-1 tablets (25-50 mg total) by mouth at bedtime as needed for sleep. 30 tablet 3  . amoxicillin-clavulanate (AUGMENTIN) 875-125 MG tablet Take 1 tablet by mouth 2 (two) times daily. 20 tablet 0  . benzonatate (TESSALON) 100 MG capsule Take 1 capsule (100 mg total) by mouth 2 (two) times daily as needed for cough. 20 capsule 0  . Chlorphen-PE-Acetaminophen (NOREL AD) 4-10-325 MG TABS Take 1 tablet by mouth every 4 (four) hours as needed (nasal congestion, cold symptoms). 20 tablet 1   No facility-administered medications prior to visit.    Allergies  Allergen Reactions  . Eggs Or Egg-Derived Products Nausea And Vomiting    Review of Systems  Constitutional: Negative.   Respiratory: Negative.   Cardiovascular: Negative.   Psychiatric/Behavioral: Negative.        Objective:    Physical Exam  There were no vitals taken for this visit. Wt Readings from Last 3 Encounters:  10/16/20 300 lb (136.1 kg)  04/24/20 (!) 320 lb (145.2 kg)  01/24/20 (!) 319 lb (144.7 kg)    Health Maintenance Due  Topic Date Due  . OPHTHALMOLOGY EXAM  Never done  . INFLUENZA VACCINE  04/26/2020  . COVID-19 Vaccine (3 - Moderna risk 4-dose series) 05/02/2020  . FOOT EXAM  06/12/2020    There are no preventive care reminders to display for this patient.   Lab Results  Component Value Date   TSH 1.380 09/13/2019   Lab Results  Component Value Date   WBC 7.6 10/30/2020   HGB 14.0 10/30/2020   HCT 42.1 10/30/2020   MCV 90 10/30/2020   PLT 294 10/30/2020    Lab Results  Component Value Date   NA 141 10/30/2020   K 4.6 10/30/2020   CO2 26 10/30/2020   GLUCOSE 112 (H) 10/30/2020   BUN 9 10/30/2020   CREATININE 1.05 10/30/2020   BILITOT 0.5 10/30/2020   ALKPHOS 94 10/30/2020   AST 11 10/30/2020   ALT 13 10/30/2020   PROT 6.9 10/30/2020   ALBUMIN 4.1 10/30/2020   CALCIUM 8.9 10/30/2020   ANIONGAP 9 04/06/2018  Lab Results  Component Value Date   CHOL 133 10/30/2020   Lab Results  Component Value Date   HDL 38 (L) 10/30/2020   Lab Results  Component Value Date   LDLCALC 83 10/30/2020   Lab Results  Component Value Date   TRIG 58 10/30/2020   Lab Results  Component Value Date   CHOLHDL 3.4 01/24/2020   Lab Results  Component Value Date   HGBA1C 7.2 (H) 10/30/2020       Assessment & Plan:   Problem List Items Addressed This Visit      Cardiovascular and Mediastinum   Essential hypertension    -BP much improved after starting olmesartan -he also has been using 5-hour energy and he thinks that contributes to his HTN        Endocrine   Type II diabetes mellitus, uncontrolled ([place])    -A1c was 7.2 -he is taking metformin 500 mg TID       Other Visit Diagnoses    Type 2 diabetes mellitus with hyperglycemia, without long-term current use of insulin (HCC)           No orders of the defined types were placed in this encounter.  Date:  11/05/2020   Location of Patient: Home Location of Provider: Office Consent was obtain for visit to be over via telehealth. I verified that I am speaking with the correct person using two identifiers.  I connected with  Michaela Corner on 11/05/20 via telephone and verified that I am speaking with the correct person using two identifiers.   I discussed the limitations of evaluation and management by telemedicine. The patient expressed understanding and agreed to proceed.  Time spent: 20 min   Noreene Larsson, NP

## 2020-11-05 NOTE — Assessment & Plan Note (Signed)
-  A1c was 7.2 -he is taking metformin 500 mg TID

## 2020-11-05 NOTE — Assessment & Plan Note (Signed)
-  BP much improved after starting olmesartan -he also has been using 5-hour energy and he thinks that contributes to his HTN

## 2020-11-13 ENCOUNTER — Other Ambulatory Visit: Payer: Self-pay

## 2020-11-13 ENCOUNTER — Encounter: Payer: Self-pay | Admitting: Nurse Practitioner

## 2020-11-13 ENCOUNTER — Ambulatory Visit (INDEPENDENT_AMBULATORY_CARE_PROVIDER_SITE_OTHER): Payer: 59 | Admitting: Nurse Practitioner

## 2020-11-13 DIAGNOSIS — R222 Localized swelling, mass and lump, trunk: Secondary | ICD-10-CM | POA: Insufficient documentation

## 2020-11-13 NOTE — Assessment & Plan Note (Signed)
-  to left lower back -has ill-defined border and is causing pain -will get soft tissue u/s -referral to surgery

## 2020-11-13 NOTE — Progress Notes (Signed)
Acute Office Visit  Subjective:    Patient ID: Robert Mcintyre, male    DOB: 04-02-1966, 55 y.o.   MRN: [id number]  Chief Complaint  Patient presents with  . Cyst    "knot" on lower back x 6 years, area has not changed in size. Becoming tender, and has a pulling sensation.     HPI Patient is in today for a mass on his back.  He states that it has been present for over 6 years, but now he is starting to feel a pulling sensation and pain when he moves.  Past Medical History:  Diagnosis Date  . Arthritis    back (had surgery 98), knees  . Asthma    uses albuterol during cold weather  . Hypertension   . Morbid obesity ([place])   . Periumbilical hernia    pt unaware  . Prostate cancer ([place])    prostate cancer around 2018, had surgery  . Pure hypercholesterolemia 04/24/2020  . Spondylolysis, lumbar region     Past Surgical History:  Procedure Laterality Date  . COLONOSCOPY N/A 09/16/2016   Procedure: COLONOSCOPY;  Surgeon: Danie Binder, MD;  [facility];  Service: Endoscopy;  Laterality: N/A;  1:00 PM  . LYMPHADENECTOMY Bilateral 04/09/2018   Procedure: LYMPHADENECTOMY;  Surgeon: Raynelle Bring, MD;  Location: [facility];  Service: Urology;  Laterality: Bilateral;  . POLYPECTOMY  09/16/2016   Procedure: POLYPECTOMY;  Surgeon: Danie Binder, MD;  [facility];  Service: Endoscopy;;  ascending colon polyp, hepatic flexure polyp, descending colon polyp,   . ROBOT ASSISTED LAPAROSCOPIC RADICAL PROSTATECTOMY N/A 04/09/2018   Procedure: XI ROBOTIC ASSISTED LAPAROSCOPIC RADICAL PROSTATECTOMY LEVEL 3;  Surgeon: Raynelle Bring, MD;  [facility];  Service: Urology;  Laterality: N/A;  ONLY NEEDS 210 MIN FOR ALL PROCEDURES  . SPINE SURGERY  05/1997   back fusion    Family History  Problem Relation Age of Onset  . Heart disease Mother   . Stroke Mother   . Hypertension Mother   . Diabetes Mother   . Asthma Father   . Hypertension Brother     Social History    Socioeconomic History  . Marital status: Single    Spouse name: Not on file  . Number of children: 0  . Years of education: 31  . Highest education level: Not on file  Occupational History  . Occupation: truck Geophysicist/field seismologist  Tobacco Use  . Smoking status: Former Smoker    Packs/day: 0.25    Years: 3.00    Pack years: 0.75    Types: Cigarettes    Quit date: 1980    Years since quitting: 42.1  . Smokeless tobacco: Never Used  Vaping Use  . Vaping Use: Never used  Substance and Sexual Activity  . Alcohol use: Yes    Comment: occasional; beer and some liquor; 6 pack last several weeks and moonshine lasts months  . Drug use: No  . Sexual activity: Not Currently  Other Topics Concern  . Not on file  Social History Narrative   Lives with brother   Social Determinants of Health   Financial Resource Strain: Not on file  Food Insecurity: Not on file  Transportation Needs: Not on file  Physical Activity: Not on file  Stress: Not on file  Social Connections: Not on file  Intimate Partner Violence: Not on file    Outpatient Medications Prior to Visit  Medication Sig Dispense Refill  . albuterol (PROAIR HFA)  108 (90 Base) MCG/ACT inhaler Inhale 2 puffs into the lungs every 6 (six) hours as needed for wheezing or shortness of breath. 18 g 0  . amLODipine (NORVASC) 10 MG tablet Take 1 tablet (10 mg total) by mouth daily. 30 tablet 1  . Bepotastine Besilate (BEPREVE) 1.5 % SOLN Place 1 drop into both eyes 2 (two) times daily as needed. 10 mL 5  . blood glucose meter kit and supplies Per insurance preference. Check blood glucose once a day. Dx E11.65 1 each 11  . budesonide-formoterol (SYMBICORT) 160-4.5 MCG/ACT inhaler Inhale 2 puffs into the lungs 2 (two) times daily. 1 Inhaler 5  . glimepiride (AMARYL) 2 MG tablet Take 1 tablet (2 mg total) by mouth daily with breakfast. 90 tablet 1  . metFORMIN (GLUCOPHAGE-XR) 500 MG 24 hr tablet Take 1 tablet (500 mg total) by mouth in the morning, at  noon, and at bedtime. 360 tablet 1  . olmesartan (BENICAR) 20 MG tablet Take 0.5 tablets (10 mg total) by mouth daily. 15 tablet 1  . rosuvastatin (CRESTOR) 5 MG tablet Take 1 tablet (5 mg total) by mouth at bedtime. 90 tablet 3  . traZODone (DESYREL) 50 MG tablet Take 0.5-1 tablets (25-50 mg total) by mouth at bedtime as needed for sleep. 30 tablet 3   No facility-administered medications prior to visit.    Allergies  Allergen Reactions  . Eggs Or Egg-Derived Products Nausea And Vomiting    Review of Systems  Constitutional: Negative.   Respiratory: Negative.   Cardiovascular: Negative.   Skin:       Mass to left lower back that is becoming sore.       Objective:    Physical Exam Constitutional:      Appearance: He is obese.  Skin:    Comments: Lower back mass approximately 1-1.5 inches in diameter, ill-defined edges  Neurological:     Mental Status: He is alert.     BP (!) 147/79   Pulse 87   Temp 98.7 F (37.1 C)   Resp 20   Ht '5\' 8"'  (1.727 m)   Wt (!) 319 lb (144.7 kg)   SpO2 94%   BMI 48.50 kg/m  Wt Readings from Last 3 Encounters:  11/13/20 (!) 319 lb (144.7 kg)  10/16/20 300 lb (136.1 kg)  04/24/20 (!) 320 lb (145.2 kg)    Health Maintenance Due  Topic Date Due  . OPHTHALMOLOGY EXAM  Never done  . INFLUENZA VACCINE  04/26/2020  . COVID-19 Vaccine (3 - Moderna risk 4-dose series) 05/02/2020  . FOOT EXAM  06/12/2020    There are no preventive care reminders to display for this patient.   Lab Results  Component Value Date   TSH 1.380 09/13/2019   Lab Results  Component Value Date   WBC 7.6 10/30/2020   HGB 14.0 10/30/2020   HCT 42.1 10/30/2020   MCV 90 10/30/2020   PLT 294 10/30/2020   Lab Results  Component Value Date   NA 141 10/30/2020   K 4.6 10/30/2020   CO2 26 10/30/2020   GLUCOSE 112 (H) 10/30/2020   BUN 9 10/30/2020   CREATININE 1.05 10/30/2020   BILITOT 0.5 10/30/2020   ALKPHOS 94 10/30/2020   AST 11 10/30/2020   ALT 13  10/30/2020   PROT 6.9 10/30/2020   ALBUMIN 4.1 10/30/2020   CALCIUM 8.9 10/30/2020   ANIONGAP 9 04/06/2018   Lab Results  Component Value Date   CHOL 133 10/30/2020   Lab  Results  Component Value Date   HDL 38 (L) 10/30/2020   Lab Results  Component Value Date   LDLCALC 83 10/30/2020   Lab Results  Component Value Date   TRIG 58 10/30/2020   Lab Results  Component Value Date   CHOLHDL 3.4 01/24/2020   Lab Results  Component Value Date   HGBA1C 7.2 (H) 10/30/2020       Assessment & Plan:   Problem List Items Addressed This Visit      Other   Mass of skin of back    -to left lower back -has ill-defined border and is causing pain -will get soft tissue u/s -referral to surgery      Relevant Orders   [place] CHEST SOFT TISSUE   Ambulatory referral to General Surgery       No orders of the defined types were placed in this encounter.    Noreene Larsson, NP

## 2020-11-19 ENCOUNTER — Ambulatory Visit (HOSPITAL_COMMUNITY)
Admission: RE | Admit: 2020-11-19 | Discharge: 2020-11-19 | Disposition: A | Discharge status: Home / Self Care | Payer: 59 | Source: Ambulatory Visit | Attending: Nurse Practitioner | Admitting: Nurse Practitioner

## 2020-11-19 ENCOUNTER — Other Ambulatory Visit: Payer: Self-pay

## 2020-11-19 DIAGNOSIS — R222 Localized swelling, mass and lump, trunk: Secondary | ICD-10-CM | POA: Diagnosis not present

## 2020-11-23 NOTE — Progress Notes (Signed)
Ultrasounds was negative. Usually this means that it is a lipoma, which is benign, but please keep your appointment with surgery for a second opinion.

## 2020-12-01 ENCOUNTER — Ambulatory Visit: Payer: 59 | Admitting: General Surgery

## 2020-12-01 ENCOUNTER — Encounter: Payer: Self-pay | Admitting: General Surgery

## 2020-12-01 ENCOUNTER — Other Ambulatory Visit: Payer: Self-pay

## 2020-12-01 VITALS — BP 143/79 | HR 85 | Temp 98.6°F | Resp 16 | Ht 68.0 in | Wt 313.0 lb

## 2020-12-01 DIAGNOSIS — R222 Localized swelling, mass and lump, trunk: Secondary | ICD-10-CM

## 2020-12-01 DIAGNOSIS — D179 Benign lipomatous neoplasm, unspecified: Secondary | ICD-10-CM

## 2020-12-01 NOTE — Patient Instructions (Signed)
Will obtain a CT to see if we can see a lipoma on the left back. Please show the tech where the area is so they can mark it on the day of the CT.

## 2020-12-01 NOTE — Progress Notes (Signed)
[facility] History and Physical  Reason for Referral: Left back mass  Referring Physician:  Noreene Larsson, NP   Chief Complaint    Other      Robert Mcintyre is a 55 y.o. male.  HPI: Robert Mcintyre is a 55 yo with chronic back pain, prostate cancer s/p prostatectomy in 2018, obesity, lumbar spondylosis who comes in with complaints of left back pain down into the left leg. He says that he has a knot on his left back and he had a [place] that demonstrated no findings. He was referred for possible lipoma.  He says that the pain is worse when he bends forward and is sharp in nature. The pain goes down into his lower back and leg area on the left. He says that he has pain in the leg even in the anterior thigh area.   He has never had drainage or infection in the area.   Past Medical History:  Diagnosis Date   Arthritis    back (had surgery 98), knees   Asthma    uses albuterol during cold weather   Hypertension    Morbid obesity ([place] Mcintyre)    Periumbilical hernia    pt unaware   Prostate cancer ([place])    prostate cancer around 2018, had surgery   Pure hypercholesterolemia 04/24/2020   Spondylolysis, lumbar region     Past Surgical History:  Procedure Laterality Date   COLONOSCOPY N/A 09/16/2016   Procedure: COLONOSCOPY;  Surgeon: Danie Binder, MD;  [facility];  Service: Endoscopy;  Laterality: N/A;  1:00 PM   LYMPHADENECTOMY Bilateral 04/09/2018   Procedure: LYMPHADENECTOMY;  Surgeon: Raynelle Bring, MD;  [facility];  Service: Urology;  Laterality: Bilateral;   POLYPECTOMY  09/16/2016   Procedure: POLYPECTOMY;  Surgeon: Danie Binder, MD;  [facility];  Service: Endoscopy;;  ascending colon polyp, hepatic flexure polyp, descending colon polyp,    ROBOT ASSISTED LAPAROSCOPIC RADICAL PROSTATECTOMY N/A 04/09/2018   Procedure: XI ROBOTIC ASSISTED LAPAROSCOPIC RADICAL PROSTATECTOMY LEVEL 3;  Surgeon: Raynelle Bring, MD;  [facility];   Service: Urology;  Laterality: N/A;  ONLY NEEDS 210 MIN FOR ALL PROCEDURES   SPINE SURGERY  05/1997   back fusion    Family History  Problem Relation Age of Onset   Heart disease Mother    Stroke Mother    Hypertension Mother    Diabetes Mother    Asthma Father    Hypertension Brother     Social History   Tobacco Use   Smoking status: Former Smoker    Packs/day: 0.25    Years: 3.00    Pack years: 0.75    Types: Cigarettes    Quit date: 1980    Years since quitting: 42.2   Smokeless tobacco: Never Used  Scientific laboratory technician Use: Never used  Substance Use Topics   Alcohol use: Yes    Comment: occasional; beer and some liquor; 6 pack last several weeks and moonshine lasts months   Drug use: No    Medications: I have reviewed the patient's current medications. Allergies as of 12/01/2020      Reactions   Eggs Or Egg-derived Products Nausea And Vomiting      Medication List       Accurate as of December 01, 2020 10:08 AM. If you have any questions, ask your nurse or doctor.        albuterol 108 (90 Base) MCG/ACT inhaler Commonly known  as: ProAir HFA Inhale 2 puffs into the lungs every 6 (six) hours as needed for wheezing or shortness of breath.   amLODipine 10 MG tablet Commonly known as: NORVASC Take 1 tablet (10 mg total) by mouth daily.   Bepreve 1.5 % Soln Generic drug: Bepotastine Besilate Place 1 drop into both eyes 2 (two) times daily as needed.   blood glucose meter kit and supplies Per insurance preference. Check blood glucose once a day. Dx E11.65   budesonide-formoterol 160-4.5 MCG/ACT inhaler Commonly known as: Symbicort Inhale 2 puffs into the lungs 2 (two) times daily.   glimepiride 2 MG tablet Commonly known as: AMARYL Take 1 tablet (2 mg total) by mouth daily with breakfast.   metFORMIN 500 MG 24 hr tablet Commonly known as: GLUCOPHAGE-XR Take 1 tablet (500 mg total) by mouth in the morning, at noon, and at bedtime.   olmesartan  20 MG tablet Commonly known as: BENICAR Take 0.5 tablets (10 mg total) by mouth daily.   rosuvastatin 5 MG tablet Commonly known as: CRESTOR Take 1 tablet (5 mg total) by mouth at bedtime.   traZODone 50 MG tablet Commonly known as: DESYREL Take 0.5-1 tablets (25-50 mg total) by mouth at bedtime as needed for sleep.        ROS:  A comprehensive review of systems was negative except for: Musculoskeletal: positive for back pain and left leg pain  Blood pressure (!) 143/79, pulse 85, temperature 98.6 F (37 C), temperature source Other (Comment), resp. rate 16, height _0  (1.727 m), weight (!) 313 lb (142 kg), SpO2 95 %. Physical Exam Vitals reviewed.  Constitutional:      Appearance: He is obese.  HENT:     Head: Normocephalic.  Eyes:     Extraocular Movements: Extraocular movements intact.  Cardiovascular:     Rate and Rhythm: Normal rate.  Pulmonary:     Effort: Pulmonary effort is normal.  Abdominal:     General: There is no distension.     Palpations: Abdomen is soft.  Musculoskeletal:     Comments: Left back mid back, knot felt deep but no obvious mass or superficial lesion noted, tender in that area  Skin:    General: Skin is warm.  Neurological:     General: No focal deficit present.     Mental Status: He is alert and oriented to person, place, and time.  Psychiatric:        Mood and Affect: Mood normal.        Behavior: Behavior normal.        Thought Content: Thought content normal.     Results: CLINICAL DATA:  Mass on the left lower back.  EXAM: ULTRASOUND OF CHEST SOFT TISSUES  TECHNIQUE: Ultrasound examination of the chest wall soft tissues was performed in the area of clinical concern.  COMPARISON:  None.  FINDINGS: No solid or cystic mass at the site of clinical palpable abnormality in the left lower back. No architectural distortion. No fluid collection or hematoma.  IMPRESSION: No sonographic abnormality at the site of clinical  concern in the left lower back.   Electronically Signed   By: Kathreen Devoid   On: 11/21/2020 09:06  Personally reviewed PET CT from 2019 and called radiology for them to look at it, no masses seen in the back that would correspond to any lipoma in 2019   Assessment & Plan:  Robert Mcintyre is a 55 y.o. male with left back pain and a knot  but no finding on the imaging. This could be muscular in nature and he also has chronic back pain and pain into his legs. I told him I do not appreciate a lipoma and also do not think that surgery on that area would improve any pain especially the pain he is describing.   -Discussed that we could repeat imaging with a CT to see if we can see any signs of a lipoma and have the area marked when we do this  -Discussed him trying massage to see if that helped the muscle/ tension  -He wants to pursue the CT. Discussed that I am not sure if the insurance will cover but we will try to get it approved.  -Will call with results if we get it scheduled   All questions were answered to the satisfaction of the patient.   Virl Cagey 12/01/2020, 10:08 AM

## 2020-12-11 ENCOUNTER — Other Ambulatory Visit: Payer: Self-pay | Admitting: Nurse Practitioner

## 2020-12-23 ENCOUNTER — Telehealth: Payer: Self-pay

## 2020-12-23 NOTE — Telephone Encounter (Signed)
Robert Mcintyre with [facility] left v/m requesting cb about med refill using ref # [id number]. This is not a [facility] pt. I called and spoke with Morey Hummingbird pharmacist at [facility] and rx and she will have call redirected to correct office; nothing further needed.

## 2020-12-23 NOTE — Telephone Encounter (Signed)
Pt aware of denied CT scan via phone call and states that he did receive his letter from his ins.

## 2020-12-24 ENCOUNTER — Other Ambulatory Visit: Payer: Self-pay

## 2020-12-24 DIAGNOSIS — I10 Essential (primary) hypertension: Secondary | ICD-10-CM

## 2020-12-24 MED ORDER — AMLODIPINE BESYLATE 10 MG PO TABS: 10.0000 mg | ORAL_TABLET | Freq: Every day | ORAL | 1 refills | Status: DC

## 2021-01-13 ENCOUNTER — Other Ambulatory Visit: Payer: Self-pay

## 2021-01-13 DIAGNOSIS — I10 Essential (primary) hypertension: Secondary | ICD-10-CM

## 2021-01-13 MED ORDER — OLMESARTAN MEDOXOMIL 20 MG PO TABS: ORAL_TABLET | ORAL | 0 refills | Status: DC

## 2021-02-20 ENCOUNTER — Other Ambulatory Visit: Payer: Self-pay | Admitting: Nurse Practitioner

## 2021-02-20 DIAGNOSIS — I10 Essential (primary) hypertension: Secondary | ICD-10-CM

## 2021-06-02 ENCOUNTER — Encounter: Payer: Self-pay | Admitting: Medical Oncology

## 2021-06-02 NOTE — Progress Notes (Signed)
Left a message regarding referral to the [facility] 9/9. I asked him to return call to confirm appointment.

## 2021-06-04 ENCOUNTER — Ambulatory Visit
Admission: RE | Admit: 2021-06-04 | Discharge: 2021-06-04 | Disposition: A | Discharge status: Home / Self Care | Payer: 59 | Source: Ambulatory Visit | Attending: Radiation Oncology | Admitting: Radiation Oncology

## 2021-06-04 ENCOUNTER — Inpatient Hospital Stay: Payer: 59 | Attending: Oncology | Admitting: Oncology

## 2021-06-04 ENCOUNTER — Encounter: Payer: Self-pay | Admitting: General Practice

## 2021-06-04 ENCOUNTER — Other Ambulatory Visit: Payer: Self-pay

## 2021-06-04 DIAGNOSIS — C61 Malignant neoplasm of prostate: Secondary | ICD-10-CM | POA: Diagnosis present

## 2021-06-04 DIAGNOSIS — Z79899 Other long term (current) drug therapy: Secondary | ICD-10-CM | POA: Diagnosis not present

## 2021-06-04 DIAGNOSIS — R9721 Rising PSA following treatment for malignant neoplasm of prostate: Secondary | ICD-10-CM

## 2021-06-04 DIAGNOSIS — Z87891 Personal history of nicotine dependence: Secondary | ICD-10-CM | POA: Insufficient documentation

## 2021-06-04 DIAGNOSIS — R5383 Other fatigue: Secondary | ICD-10-CM | POA: Diagnosis not present

## 2021-06-04 NOTE — Progress Notes (Signed)
                               Care Plan Summary  Name: Robert Mcintyre DOB: 01-Feb-1966    Your Medical Team:   Urologist -  Dr. Raynelle Bring, [facility]  Radiation Oncologist - Dr. Tyler Pita, [facility]   Medical Oncologist - Dr. Zola Button, [place]  Recommendations: 1) Short Term Androgen Deprivation Therapy  (ADT) 2) Radiation    * These recommendations are based on information available as of today's consult.      Recommendations may change depending on the results of further tests or exams.    Next Steps: 1) Consider your options.  2) Dr. Lynne Logan office ([facility]) will contact you to start ADT therapy. 3) Dr. Johny Shears office will contact you to set up radiation.  When appointments need to be scheduled, you will be contacted by [facility] and/or [facility].  Questions?  Please do not hesitate to call Cira Rue, RN, BSN, OCN or Katheren Puller, RN, BSN at [phone] any questions or concerns.  Robin/Liz is your Oncology Nurse Navigator and is available to assist you while you're receiving your medical care at [facility].

## 2021-06-04 NOTE — Progress Notes (Signed)
[place] Psychosocial Distress Screening Spiritual Care  Met with Robert Mcintyre in [facility] to introduce [place] team/resources, reviewing distress screen per protocol.  The patient scored a 3 on the Psychosocial Distress Thermometer which indicates mild distress. Also assessed for distress and other psychosocial needs.   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 He describes himself as "a realistic person," meaning that he is pragmatic in his approach to tackling problems, tending to take action instead of worrying.   Follow up needed: No. [name] plans to reach out as needed/desired and has full packet of [place] support team/programming info.   [place], [place], [facility] Pager [phone] Voicemail [phone]

## 2021-06-04 NOTE — Progress Notes (Signed)
Radiation Oncology         [phone] ________________________________  Multidisciplinary Prostate Cancer Clinic  Initial Radiation Oncology Consultation  Name: Robert Mcintyre MRN: [id number]  Date: 06/04/2021  DOB: 31-Jul-1966  JO:ACZY, Trinna Balloon, NP  Raynelle Bring, MD   REFERRING PHYSICIAN: Raynelle Bring, MD  DIAGNOSIS: 55 y.o. gentleman with biochemically recurrent adenocarcinoma of the prostate with a current PSA of 0.24 s/p RALP 03/2018 for Stage pT3aN0, Gleason 3+4 prostate cancer    ICD-10-CM   1. Biochemically recurrent malignant neoplasm of prostate ([place])  C61    R97.21       HISTORY OF PRESENT ILLNESS::Robert Mcintyre is a 55 y.o. gentleman with a diagnosis of biochemically recurrent prostate cancer. He was initially diagnosed with low volume Gleason 3+3 prostate cancer in 2011 at [facility] with a PSA of 13. He was referred to Dr. Jeffie Pollock for follow up after his PSA reached 23 in 09/2017. He underwent repeat biopsy on 12/15/17 showing upstaging to Gleason 3+4.       He opted to proceed with RALP on 04/09/18 under the care of Dr. Alinda Money.  Final surgical pathology revealed Gleason 3+4 prostatic adenocarcinoma with focal extraprostatic extension. Seminal vesicles, margins, and lymph nodes were negative. His postoperative PSA was undetectable.    Since that time, his PSA became low detectable in May 2020 and slowly rising since that time.  The PSA increased to 0.054 in May 2021, 0.12 in April 2022, and most recently up to 0.27 on 04/23/2021.  Dr. Alinda Money ordered a PSMA scan for disease restaging, but unfortunately his insurance denied it, saying they would not cover it until the patient's PSA reached 0.5.     The patient reviewed the PSA results with his urologist and he has kindly been referred today to the multidisciplinary prostate cancer clinic for presentation of pathology and radiology studies in our conference for discussion of potential radiation treatment options and clinical  evaluation.   PREVIOUS RADIATION THERAPY: No  PAST MEDICAL HISTORY:  has a past medical history of Arthritis, Asthma, Hypertension, Morbid obesity ([place]), Periumbilical hernia, Prostate cancer ([place]), Pure hypercholesterolemia (04/24/2020), and Spondylolysis, lumbar region.    PAST SURGICAL HISTORY: Past Surgical History:  Procedure Laterality Date   COLONOSCOPY N/A 09/16/2016   Procedure: COLONOSCOPY;  Surgeon: Danie Binder, MD;  Location: [facility];  Service: Endoscopy;  Laterality: N/A;  1:00 PM   LYMPHADENECTOMY Bilateral 04/09/2018   Procedure: LYMPHADENECTOMY;  Surgeon: Raynelle Bring, MD;  Location: [facility];  Service: Urology;  Laterality: Bilateral;   POLYPECTOMY  09/16/2016   Procedure: POLYPECTOMY;  Surgeon: Danie Binder, MD;  Location: [facility];  Service: Endoscopy;;  ascending colon polyp, hepatic flexure polyp, descending colon polyp,    ROBOT ASSISTED LAPAROSCOPIC RADICAL PROSTATECTOMY N/A 04/09/2018   Procedure: XI ROBOTIC ASSISTED LAPAROSCOPIC RADICAL PROSTATECTOMY LEVEL 3;  Surgeon: Raynelle Bring, MD;  Location: [facility];  Service: Urology;  Laterality: N/A;  ONLY NEEDS 210 MIN FOR ALL PROCEDURES   SPINE SURGERY  05/1997   back fusion    FAMILY HISTORY: family history includes Asthma in his father; Diabetes in his mother; Heart disease in his mother; Hypertension in his brother and mother; Stroke in his mother.  SOCIAL HISTORY:  reports that he quit smoking about 42 years ago. His smoking use included cigarettes. He has a 0.75 pack-year smoking history. He has never used smokeless tobacco. He reports current alcohol use. He reports that he does not use drugs.  ALLERGIES: Eggs  or egg-derived products  MEDICATIONS:  Current Outpatient Medications  Medication Sig Dispense Refill   albuterol (PROAIR HFA) 108 (90 Base) MCG/ACT inhaler Inhale 2 puffs into the lungs every 6 (six) hours as needed for wheezing or shortness of breath. 18 g 0   amLODipine (NORVASC) 10 MG  tablet TAKE 1 TABLET BY MOUTH  DAILY 60 tablet 5   Bepotastine Besilate (BEPREVE) 1.5 % SOLN Place 1 drop into both eyes 2 (two) times daily as needed. 10 mL 5   blood glucose meter kit and supplies Per insurance preference. Check blood glucose once a day. Dx E11.65 1 each 11   budesonide-formoterol (SYMBICORT) 160-4.5 MCG/ACT inhaler Inhale 2 puffs into the lungs 2 (two) times daily. 1 Inhaler 5   glimepiride (AMARYL) 2 MG tablet Take 1 tablet (2 mg total) by mouth daily with breakfast. 90 tablet 1   metFORMIN (GLUCOPHAGE-XR) 500 MG 24 hr tablet Take 1 tablet (500 mg total) by mouth in the morning, at noon, and at bedtime. 360 tablet 1   olmesartan (BENICAR) 20 MG tablet Take 1/2 (one-half) tablet by mouth once daily 15 tablet 0   rosuvastatin (CRESTOR) 5 MG tablet Take 1 tablet (5 mg total) by mouth at bedtime. 90 tablet 3   traZODone (DESYREL) 50 MG tablet Take 0.5-1 tablets (25-50 mg total) by mouth at bedtime as needed for sleep. 30 tablet 3   No current facility-administered medications for this encounter.    REVIEW OF SYSTEMS:  On review of systems, the patient reports that he is doing well overall. He denies any chest pain, shortness of breath, cough, fevers, chills, night sweats, unintended weight changes. He denies any bowel disturbances, and denies abdominal pain, nausea or vomiting. He denies any new musculoskeletal or joint aches or pains. A complete review of systems is obtained and is otherwise negative.   PHYSICAL EXAM:  Wt Readings from Last 3 Encounters:  06/04/21 (!) 307 lb 12.8 oz (139.6 kg)  12/01/20 (!) 313 lb (142 kg)  11/13/20 (!) 319 lb (144.7 kg)   Temp Readings from Last 3 Encounters:  06/04/21 97.6 F (36.4 C)  12/01/20 98.6 F (37 C) (Other (Comment))  11/13/20 98.7 F (37.1 C)   BP Readings from Last 3 Encounters:  06/04/21 (!) 148/85  12/01/20 (!) 143/79  11/13/20 (!) 147/79   Pulse Readings from Last 3 Encounters:  06/04/21 80  12/01/20 85   11/13/20 87    /10  In general this is a well appearing African-American male in no acute distress. He's alert and oriented x4 and appropriate throughout the examination. Cardiopulmonary assessment is negative for acute distress and he exhibits normal effort.    KPS = 100  100 - Normal; no complaints; no evidence of disease. 90   - Able to carry on normal activity; minor signs or symptoms of disease. 80   - Normal activity with effort; some signs or symptoms of disease. 86   - Cares for self; unable to carry on normal activity or to do active work. 60   - Requires occasional assistance, but is able to care for most of his personal needs. 50   - Requires considerable assistance and frequent medical care. 19   - Disabled; requires special care and assistance. 35   - Severely disabled; hospital admission is indicated although death not imminent. 71   - Very sick; hospital admission necessary; active supportive treatment necessary. 10   - Moribund; fatal processes progressing rapidly. 0     -  Dead  Karnofsky DA, Abelmann [place], Craver LS and Jolivue JH [phone]) The use of the nitrogen mustards in the palliative treatment of carcinoma: with particular reference to bronchogenic carcinoma Cancer 1 634-56   LABORATORY DATA:  Lab Results  Component Value Date   WBC 7.6 10/30/2020   HGB 14.0 10/30/2020   HCT 42.1 10/30/2020   MCV 90 10/30/2020   PLT 294 10/30/2020   Lab Results  Component Value Date   NA 141 10/30/2020   K 4.6 10/30/2020   CL 103 10/30/2020   CO2 26 10/30/2020   Lab Results  Component Value Date   ALT 13 10/30/2020   AST 11 10/30/2020   ALKPHOS 94 10/30/2020   BILITOT 0.5 10/30/2020     RADIOGRAPHY: No results found.    IMPRESSION/PLAN: 55 y.o. gentleman with biochemically recurrent adenocarcinoma of the prostate with a current PSA of 0.24 s/p RALP 03/2018 for Stage pT3aN0, Gleason 3+4 prostate cancer.  Today, we reviewed the findings and workup thus far.  We  discussed the natural history of recurrent prostate cancer.  We reviewed the the implications of positive margins, extracapsular extension, and seminal vesicle involvement on the risk of prostate cancer recurrence. In his case, focal extraprostatic extension was present. We reviewed some of the evidence suggesting an advantage for patients who undergo adjuvant radiotherapy in the setting in terms of disease control and overall survival. We also discussed some of the dilemmas related to the available evidence.  We discussed the SWOG trial which did show an improvement in disease-free survival as well as overall survival using adjuvant radiotherapy. However, we discussed the fact that the study did not carefully control the usage of adjuvant radiotherapy in the observation arm. There is increasing evidence that careful surveillance with ultrasensitive PSA may provide an opportunity for early salvage in patients who undergo observation, which can lead to excellent results in terms of disease control and survival. We discussed radiation treatment directed to the prostatic fossa with regard to the logistics and delivery of external beam radiation treatment.  He was encouraged to ask questions that were answered to his stated satisfaction.  At the conclusion of our conversation, the patient is interested in moving forward with 7.5 weeks of salvage external beam therapy in combination with ST-ADT. We will share our discussion with Dr. Alinda Money and make arrangements for start of ADT, first available, prior to [place]. The patient appears to have a good understanding of his disease and our treatment recommendations which are of curative intent and is in agreement with the stated plan.  Therefore, we will move forward with treatment planning accordingly, in anticipation of beginning IMRT in the near future.  We personally spent 60 minutes in this encounter including chart review, reviewing radiological studies,  meeting face-to-face with the patient, entering orders and completing documentation.    Nicholos Johns, PA-C    Tyler Pita, MD  [facility] Direct Dial: [phone]  Fax: [phone] [email or web address]  Skype  [email or web address]   This document serves as a record of services personally performed by Tyler Pita, MD and Freeman Caldron, PA-C. It was created on their behalf by Wilburn Mylar, a trained medical scribe. The creation of this record is based on the scribe's personal observations and the provider's statements to them. This document has been checked and approved by the attending provider.

## 2021-06-04 NOTE — Consult Note (Signed)
[facility]     06/04/2021   --------------------------------------------------------------------------------   Robert Mcintyre  MRN: [id number]  DOB: 1965/12/22, 55 year old Male  SSN: [id number]   PRIMARY CARE:    REFERRING:    PROVIDER:  Raynelle Bring, M.D.  LOCATION:  [facility] [phone]     --------------------------------------------------------------------------------   CC/HPI: CC: Prostate Cancer   PCP:  Location of consult: [facility] - Prostate Cancer Multidisciplinary Clinic   Robert Mcintyre is a 55 year old gentleman with a past medical history of hypertension and low testosterone who was diagnosed with prostate cancer in 2019 and underwent primary treatment with a BNS radical prostatectomy and BPLND on 04/09/18. His surgical pathology indicated a pT3a N0 Mx, Gleason 3+4=7 adenocarcinoma with negative surgical margins. His PSA was completely undetectable in October 2019 but increased to 0.11 in December 2021, 0.12 in April 2022, and 0.22 in July 2022. This was repeated a week later and has increased to 0.27 on 04/23/21. I have attempted to order a PSMA PET scan for localization of recurrent disease. This was denied by his insurance company despite my peer to peer review and appeal. He presents today to discuss options for treatment of biochemically recurrent disease.     ALLERGIES: No Allergies    MEDICATIONS: Metformin Hcl  Sildenafil Citrate 100 mg tablet 1 tablet PO as directed PRN  Amlodipine Besylate 5 mg tablet  Glimepiride  Rosuvastatin Calcium     GU PSH: Complex Uroflow - 2019 Laparoscopy; Lymphadenectomy - 2019 Prostate Needle Biopsy - 2019, about 2011 Robotic Radical Prostatectomy - 2019     NON-GU PSH: Back surgery - 1998 Surgical Pathology, Gross And Microscopic Examination For Prostate Needle - 2019     GU PMH: ED due to arterial insufficiency - 04/23/2021, - 10/02/2020, He has ED but we need to sort out the  prostate cancer issue before addressing this condition. , - 2019 Prostate Cancer - 04/23/2021, - 10/02/2020 (Worsening), He has T1c N(?0-1) M0 Gleason 7(3+4) high risk prostate cancer with moderate LUTs and severe ED. I am going to get an Axumin scan to try to better clarify the nodal status and I will repeat the PSA today since I think the last level was an error. I am going try to have him seen in the [place], but if the nodes are not clearly involved on Axumin he will be best served by surgical therapy I think. If the nodes are positive, he will either need ADT and XRT or possibly a clinical trial with ADT and surgery. , - 2019, - 2019, He has a history of small volume Gleason 6 prostate cancer with a PSA of 13 in 2011 and no subsequent f/u. I am going to get a PSA today and have him return for a prostate [place] and biopsy. I have reviewed the risks of bleeding, infection and voiding difficulty. Levaquin sent. , - 2019 Urinary Urgency - 04/23/2021, - 10/02/2020, - 02/28/2020 Phimosis, He has moderate to severe phimosis and may require circumcision at some point. - 2019      PMH Notes:   1) Prostate cancer: He is s/p a BNS RAL radical prostatectomy and BPLND on 04/09/18.   Diagnosis: pT3a N0 Mx, Gleason 3+4=7 adenocarcinoma with negative surgical margins  Pretreatment PSA: 21.9  Pretreatment SHIM score: 1 (he had pre-existing ED refractory to PDE-5 inhibitors)   NON-GU PMH: Low testosterone - 04/23/2021, - 10/02/2020 Hypertension    FAMILY HISTORY: No Family  History    SOCIAL HISTORY: Marital Status: Single Preferred Language: English; Race: Black or African American Current Smoking Status: Patient has never smoked.   Tobacco Use Assessment Completed: Used Tobacco in last 30 days? Has never drank.  Drinks 1 caffeinated drink per day. 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    REVIEW OF SYSTEMS:    GU Review Male:   Patient denies frequent urination, hard to postpone urination, burning/ pain with  urination, get up at night to urinate, leakage of urine, stream starts and stops, trouble starting your streams, and have to strain to urinate .  Gastrointestinal (Upper):   Patient denies nausea and vomiting.  Gastrointestinal (Lower):   Patient denies diarrhea and constipation.  Constitutional:   Patient denies fever, night sweats, weight loss, and fatigue.  Skin:   Patient denies skin rash/ lesion and itching.  Eyes:   Patient denies blurred vision and double vision.  Ears/ Nose/ Throat:   Patient denies sore throat and sinus problems.  Hematologic/Lymphatic:   Patient denies swollen glands and easy bruising.  Cardiovascular:   Patient denies leg swelling and chest pains.  Respiratory:   Patient denies cough and shortness of breath.  Endocrine:   Patient denies excessive thirst.  Musculoskeletal:   Patient denies back pain and joint pain.  Neurological:   Patient denies headaches and dizziness.  Psychologic:   Patient denies depression and anxiety.   VITAL SIGNS: None   MULTI-SYSTEM PHYSICAL EXAMINATION:    Constitutional: Well-nourished. No physical deformities. Normally developed. Good grooming.     Complexity of Data:  Lab Test Review:   PSA  Records Review:   Pathology Reports, Previous Patient Records   04/23/21 04/16/21 01/05/21 09/24/20 02/21/20 08/21/19 01/25/19 07/05/18  PSA  Total PSA 0.27 ng/mL 0.22 ng/mL 0.12 ng/mL 0.11 ng/mL 0.054 ng/mL 0.056 ng/mL 0.018 ng/mL <0.015 ng/mL    09/24/20 02/16/18  Hormones  Testosterone, Total 214.4 ng/dL 261 pg/dL    PROCEDURES: None   ASSESSMENT:      ICD-10 Details  1 GU:   Prostate Cancer - C61    PLAN:           Document Letter(s):  Created for Patient: Clinical Summary         Notes:   1. Biochemically recurrent prostate cancer: We had a long discussion today and he has previously seen Dr. Tammi Klippel and Dr. Alen Blew earlier this morning. Although I expressed my disappointment that his insurance company would not approve a  PSMA PET scan in this very appropriate setting, the multidisciplinary group was in agreement that he should not necessarily wait until his PSA is higher to get an approved PSMA PET scan. He is an appropriate candidate to proceed with salvage radiation therapy and short-term androgen deprivation therapy. He did have a PSA drawn just this week that came back at 0.2 for confirming biochemical recurrence but without a significant jump over the past month. After reviewing options, he is interested in proceeding and will begin a 6 month Eligard injection in the near future with plans than to complete salvage radiation therapy. We have reviewed the potential risks, complications, and the expected process associated with salvage radiation therapy. All of his questions were answered today to his stated satisfaction.   2. Testosterone deficiency: His testosterone deficiency was confirmed on his labs earlier this week. Although he is interested in eventually replacing his testosterone, he understands the need to actually proceed with androgen deprivation therapy short-term right now. We will address this further  in the future pending his results from therapy.   3. Mild stress incontinence: I have encouraged him to continue his pelvic floor exercises. He does understand the risk of developing worsening incontinence after radiation therapy.   4. Erectile dysfunction: We will again plan to consider addressing this along with his testosterone deficiency in the future.   Cc: Dr. Zola Button  Dr. Tyler Pita         Next Appointment:      Next Appointment: 11/19/2021 07:30 AM    Appointment Type: Laboratory Appointment    Location: [facility] [phone]    Provider: Lab LAB    Reason for Visit: 25mopsa/total t      E & M CODES: We spent 38 minutes dedicated to evaluation and management time, including face to face interaction, discussions on coordination of care, documentation, result  review, and discussion with others as applicable.

## 2021-06-04 NOTE — Progress Notes (Signed)
Reason for the request:    Prostate cancer  HPI: I was asked by Dr. Alinda Money to evaluate Robert Mcintyre for the evaluation of prostate cancer.  He is a 55 year old man diagnosed with prostate cancer in 2019.  He was found to have PSA of 21 and a Gleason score 3+4= 7 at that time.  He underwent a radical prostatectomy on April 09, 2018 under the care of Dr. Alinda Money.  The final pathology showed a T3a Gleason score 7 with 0 out of 4 lymph nodes involved.  Is a PSA went undetectable postoperatively but started to rise recently.  His PSA was 0.11 in December 2021, 0.12 in April 2022 and 0.22 in July 2022.  On April 23, 2021 it was 0.27.  Clinically, he reports a few complaints including erectile dysfunction as well as occasional stress incontinence.  He denies any fevers or chills but does report some increased fatigue.  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  He does not report any headaches, blurry vision, syncope or seizures. Does not report any fevers, chills or sweats.  Does not report any cough, wheezing or hemoptysis.  Does not report any chest pain, palpitation, orthopnea or leg edema.  Does not report any nausea, vomiting or abdominal pain.  Does not report any constipation or diarrhea.  Does not report any skeletal complaints.    Does not report frequency, urgency or hematuria.  Does not report any skin rashes or lesions. Does not report any heat or cold intolerance.  Does not report any lymphadenopathy or petechiae.  Does not report any anxiety or depression.  Remaining review of systems is negative.     Past Medical History:  Diagnosis Date   Arthritis    back (had surgery 98), knees   Asthma    uses albuterol during cold weather   Hypertension    Morbid obesity ([place])    Periumbilical hernia    pt unaware   Prostate cancer ([place])    prostate cancer around 2018, had surgery   Pure hypercholesterolemia 04/24/2020   Spondylolysis, lumbar region   :   Past Surgical History:   Procedure Laterality Date   COLONOSCOPY N/A 09/16/2016   Procedure: COLONOSCOPY;  Surgeon: Danie Binder, MD;  [facility];  Service: Endoscopy;  Laterality: N/A;  1:00 PM   LYMPHADENECTOMY Bilateral 04/09/2018   Procedure: LYMPHADENECTOMY;  Surgeon: Raynelle Bring, MD;  [facility]: [facility];  Service: Urology;  Laterality: Bilateral;   POLYPECTOMY  09/16/2016   Procedure: POLYPECTOMY;  Surgeon: Danie Binder, MD;  [facility];  Service: Endoscopy;;  ascending colon polyp, hepatic flexure polyp, descending colon polyp,    ROBOT ASSISTED LAPAROSCOPIC RADICAL PROSTATECTOMY N/A 04/09/2018   Procedure: XI ROBOTIC ASSISTED LAPAROSCOPIC RADICAL PROSTATECTOMY LEVEL 3;  Surgeon: Raynelle Bring, MD;  [facility];  Service: Urology;  Laterality: N/A;  ONLY NEEDS 210 MIN FOR ALL PROCEDURES   SPINE SURGERY  05/1997   back fusion  :   Current Outpatient Medications:    albuterol (PROAIR HFA) 108 (90 Base) MCG/ACT inhaler, Inhale 2 puffs into the lungs every 6 (six) hours as needed for wheezing or shortness of breath., Disp: 18 g, Rfl: 0   amLODipine (NORVASC) 10 MG tablet, TAKE 1 TABLET BY MOUTH  DAILY, Disp: 60 tablet, Rfl: 5   Bepotastine Besilate (BEPREVE) 1.5 % SOLN, Place 1 drop into both eyes 2 (two) times daily as needed., Disp: 10 mL, Rfl: 5  blood glucose meter kit and supplies, Per insurance preference. Check blood glucose once a day. Dx E11.65, Disp: 1 each, Rfl: 11   budesonide-formoterol (SYMBICORT) 160-4.5 MCG/ACT inhaler, Inhale 2 puffs into the lungs 2 (two) times daily., Disp: 1 Inhaler, Rfl: 5   glimepiride (AMARYL) 2 MG tablet, Take 1 tablet (2 mg total) by mouth daily with breakfast., Disp: 90 tablet, Rfl: 1   metFORMIN (GLUCOPHAGE-XR) 500 MG 24 hr tablet, Take 1 tablet (500 mg total) by mouth in the morning, at noon, and at bedtime., Disp: 360 tablet, Rfl: 1   olmesartan (BENICAR) 20 MG tablet, Take 1/2 (one-half) tablet by mouth once daily, Disp: 15 tablet,  Rfl: 0   rosuvastatin (CRESTOR) 5 MG tablet, Take 1 tablet (5 mg total) by mouth at bedtime., Disp: 90 tablet, Rfl: 3   traZODone (DESYREL) 50 MG tablet, Take 0.5-1 tablets (25-50 mg total) by mouth at bedtime as needed for sleep., Disp: 30 tablet, Rfl: 3:   Allergies  Allergen Reactions   Eggs Or Egg-Derived Products Nausea And Vomiting  :   Family History  Problem Relation Age of Onset   Heart disease Mother    Stroke Mother    Hypertension Mother    Diabetes Mother    Asthma Father    Hypertension Brother   :   Social History   Socioeconomic History   Marital status: Single    Spouse name: Not on file   Number of children: 0   Years of education: 12   Highest education level: Not on file  Occupational History   Occupation: truck driver  Tobacco Use   Smoking status: Former    Packs/day: 0.25    Years: 3.00    Pack years: 0.75    Types: Cigarettes    Quit date: 1980    Years since quitting: 42.7   Smokeless tobacco: Never  Vaping Use   Vaping Use: Never used  Substance and Sexual Activity   Alcohol use: Yes    Comment: occasional; beer and some liquor; 6 pack last several weeks and moonshine lasts months   Drug use: No   Sexual activity: Not Currently  Other Topics Concern   Not on file  Social History Narrative   Lives with brother   Social Determinants of Health   Financial Resource Strain: Not on file  Food Insecurity: Not on file  Transportation Needs: Not on file  Physical Activity: Not on file  Stress: Not on file  Social Connections: Not on file  Intimate Partner Violence: Not on file  :  Pertinent items are noted in HPI.  Exam: ECOG 0 General appearance: alert and cooperative appeared without distress. Head: atraumatic without any abnormalities. Eyes: conjunctivae/corneas clear. PERRL.  Sclera anicteric. Throat: lips, mucosa, and tongue normal; without oral thrush or ulcers. Resp: clear to auscultation bilaterally without rhonchi,  wheezes or dullness to percussion. Cardio: regular rate and rhythm, S1, S2 normal, no murmur, click, rub or gallop GI: soft, non-tender; bowel sounds normal; no masses,  no organomegaly Skin: Skin color, texture, turgor normal. No rashes or lesions Lymph nodes: Cervical, supraclavicular, and axillary nodes normal. Neurologic: Grossly normal without any motor, sensory or deep tendon reflexes. Musculoskeletal: No joint deformity or effusion.   Assessment and Plan:    55 year old man with prostate cancer diagnosed in 2019.  He presented with a PSA of 21 and a Gleason score of 3+4 =7.  He underwent radical prostatectomy at that time with a final pathology showed T3a  N0 with focal extraprostatic extension in the left posterior mid in the right posterior lateral base.  His case was discussed today in the prostate cancer multidisciplinary clinic.  Pathology results were reviewed with the reviewing pathologist.  Imaging studies also reviewed with radiology.  Given the rise in his PSA and high risk of local relapse given his focal extraprostatic extension salvage radiation therapy is recommended at this time.  The role for androgen deprivation therapy was discussed at this time.  Given his low testosterone he benefit is marginal and he is concerned about further complications associated with androgen deprivation including erectile dysfunction, fatigue among others.  At this time I recommended definitely proceeding with radiation therapy as a salvage and addition of short course of androgen deprivation if he is willing.  Course of 6 months at the most would be reasonable at this time.  The role for additional systemic therapy at this time will be deferred.  45  minutes were dedicated to this visit. The time was spent on reviewing laboratory data, imaging studies, discussing treatment options, and answering questions regarding future plan.   A copy of this consult has been forwarded to the requesting  physician.

## 2021-06-07 DIAGNOSIS — R9721 Rising PSA following treatment for malignant neoplasm of prostate: Secondary | ICD-10-CM | POA: Insufficient documentation

## 2021-06-10 ENCOUNTER — Telehealth: Payer: Self-pay | Admitting: *Deleted

## 2021-06-10 NOTE — Addendum Note (Signed)
Encounter addended by: Tyler Pita, MD on: 06/10/2021 12:39 PM  Actions taken: Delete clinical note

## 2021-06-10 NOTE — Telephone Encounter (Signed)
CALLED PATIENT TO INFORM THAT SIM APPT. HAS BEEN MOVED TO 06-22-21- ARRIVAL TIME- 10:15 AM @ [place], SPOKE WITH PATIENT AND HE IS AWARE OF THIS APPT.

## 2021-06-10 NOTE — Telephone Encounter (Signed)
CALLED PATIENT TO INFORM THAT HE SHOULDN'T COME TOMORROW DUE TO NOT HAVING AN ADT, SPOKE WITH PATIENT AND HE VERIFIED UNDERSTANDING THIS

## 2021-06-11 ENCOUNTER — Encounter: Payer: Self-pay | Admitting: Urology

## 2021-06-11 ENCOUNTER — Ambulatory Visit: Payer: 59 | Admitting: Radiation Oncology

## 2021-06-11 NOTE — Progress Notes (Signed)
Patient got a 50-monthEligard injection this morning.  ANicholos Johns MMS, PA-C [place] [phone] Fax: [phone]

## 2021-06-22 ENCOUNTER — Other Ambulatory Visit: Payer: Self-pay

## 2021-06-22 ENCOUNTER — Ambulatory Visit
Admission: RE | Admit: 2021-06-22 | Discharge: 2021-06-22 | Disposition: A | Discharge status: Home / Self Care | Payer: 59 | Source: Ambulatory Visit | Attending: Radiation Oncology | Admitting: Radiation Oncology

## 2021-06-22 DIAGNOSIS — C61 Malignant neoplasm of prostate: Secondary | ICD-10-CM | POA: Diagnosis not present

## 2021-06-22 NOTE — Progress Notes (Signed)
  Radiation Oncology         [phone] ________________________________  Name: Robert Mcintyre MRN: [id number]  Date: 06/22/2021  DOB: 1966/06/04  SIMULATION AND TREATMENT PLANNING NOTE    ICD-10-CM   1. Prostate cancer [facility])  C61       DIAGNOSIS:  55 y.o. gentleman with biochemically recurrent adenocarcinoma of the prostate with a current PSA of 0.24 s/p RALP 03/2018 for Stage pT3aN0, Gleason 3+4 prostate cancer  NARRATIVE:  The patient was brought to the [place].  Identity was confirmed.  All relevant records and images related to the planned course of therapy were reviewed.  The patient freely provided informed written consent to proceed with treatment after reviewing the details related to the planned course of therapy. The consent form was witnessed and verified by the simulation staff.  Then, the patient was set-up in a stable reproducible supine position for radiation therapy.  A vacuum lock pillow device was custom fabricated to position his legs in a reproducible immobilized position.  CT images were obtained.  Surface markings were placed.  The CT images were loaded into the planning software.  Then the prostate bed target, pelvic lymph node target and avoidance structures including the rectum, bladder, bowel and hips were contoured.  Treatment planning then occurred.  The radiation prescription was entered and confirmed.  A total of one complex treatment devices were fabricated. I have requested : Intensity Modulated Radiotherapy (IMRT) is medically necessary for this case for the following reason:  Rectal sparing.Marland Kitchen  PLAN:  The patient will receive 45 Gy in 25 fractions of 1.8 Gy, followed by a boost to the prostate bed to a total dose of 68.4 Gy with 13 additional fractions of 1.8 Gy.   ________________________________  Sheral Apley Tammi Klippel, M.D.

## 2021-06-24 NOTE — Progress Notes (Signed)
RN left voicemail to follow up from [facility] 06/04/2021.  Contact information left for call back.

## 2021-06-25 DIAGNOSIS — C61 Malignant neoplasm of prostate: Secondary | ICD-10-CM | POA: Diagnosis not present

## 2021-07-01 ENCOUNTER — Other Ambulatory Visit: Payer: Self-pay

## 2021-07-01 ENCOUNTER — Ambulatory Visit
Admission: RE | Admit: 2021-07-01 | Discharge: 2021-07-01 | Disposition: A | Discharge status: Home / Self Care | Payer: 59 | Source: Ambulatory Visit | Attending: Radiation Oncology | Admitting: Radiation Oncology

## 2021-07-01 DIAGNOSIS — Z510 Encounter for antineoplastic radiation therapy: Secondary | ICD-10-CM | POA: Insufficient documentation

## 2021-07-01 DIAGNOSIS — C61 Malignant neoplasm of prostate: Secondary | ICD-10-CM | POA: Diagnosis present

## 2021-07-02 ENCOUNTER — Ambulatory Visit
Admission: RE | Admit: 2021-07-02 | Discharge: 2021-07-02 | Disposition: A | Discharge status: Home / Self Care | Payer: 59 | Source: Ambulatory Visit | Attending: Radiation Oncology | Admitting: Radiation Oncology

## 2021-07-02 ENCOUNTER — Other Ambulatory Visit: Payer: Self-pay

## 2021-07-02 DIAGNOSIS — Z510 Encounter for antineoplastic radiation therapy: Secondary | ICD-10-CM | POA: Diagnosis not present

## 2021-07-05 ENCOUNTER — Other Ambulatory Visit: Payer: Self-pay

## 2021-07-05 ENCOUNTER — Ambulatory Visit
Admission: RE | Admit: 2021-07-05 | Discharge: 2021-07-05 | Disposition: A | Discharge status: Home / Self Care | Payer: 59 | Source: Ambulatory Visit | Attending: Radiation Oncology | Admitting: Radiation Oncology

## 2021-07-05 DIAGNOSIS — Z510 Encounter for antineoplastic radiation therapy: Secondary | ICD-10-CM | POA: Diagnosis not present

## 2021-07-06 ENCOUNTER — Other Ambulatory Visit: Payer: Self-pay

## 2021-07-06 ENCOUNTER — Telehealth: Payer: Self-pay

## 2021-07-06 ENCOUNTER — Ambulatory Visit
Admission: RE | Admit: 2021-07-06 | Discharge: 2021-07-06 | Disposition: A | Discharge status: Home / Self Care | Payer: 59 | Source: Ambulatory Visit | Attending: Radiation Oncology | Admitting: Radiation Oncology

## 2021-07-06 DIAGNOSIS — I10 Essential (primary) hypertension: Secondary | ICD-10-CM

## 2021-07-06 DIAGNOSIS — Z510 Encounter for antineoplastic radiation therapy: Secondary | ICD-10-CM | POA: Diagnosis not present

## 2021-07-06 MED ORDER — AMLODIPINE BESYLATE 10 MG PO TABS
10.0000 mg | ORAL_TABLET | Freq: Every day | ORAL | 5 refills | Status: DC
Start: 2021-07-06 — End: 2021-12-17

## 2021-07-06 NOTE — Telephone Encounter (Signed)
Patient called need med refill   amLODipine (NORVASC) 10 MG tablet   Pharmacy: [facility]

## 2021-07-06 NOTE — Telephone Encounter (Signed)
Refill sent.

## 2021-07-07 ENCOUNTER — Other Ambulatory Visit: Payer: Self-pay

## 2021-07-07 ENCOUNTER — Ambulatory Visit
Admission: RE | Admit: 2021-07-07 | Discharge: 2021-07-07 | Disposition: A | Discharge status: Home / Self Care | Payer: 59 | Source: Ambulatory Visit | Attending: Radiation Oncology | Admitting: Radiation Oncology

## 2021-07-07 DIAGNOSIS — Z510 Encounter for antineoplastic radiation therapy: Secondary | ICD-10-CM | POA: Diagnosis not present

## 2021-07-08 ENCOUNTER — Ambulatory Visit
Admission: RE | Admit: 2021-07-08 | Discharge: 2021-07-08 | Disposition: A | Discharge status: Home / Self Care | Payer: 59 | Source: Ambulatory Visit | Attending: Radiation Oncology | Admitting: Radiation Oncology

## 2021-07-08 DIAGNOSIS — Z510 Encounter for antineoplastic radiation therapy: Secondary | ICD-10-CM | POA: Diagnosis not present

## 2021-07-09 ENCOUNTER — Other Ambulatory Visit: Payer: Self-pay

## 2021-07-09 ENCOUNTER — Ambulatory Visit
Admission: RE | Admit: 2021-07-09 | Discharge: 2021-07-09 | Disposition: A | Discharge status: Home / Self Care | Payer: 59 | Source: Ambulatory Visit | Attending: Radiation Oncology | Admitting: Radiation Oncology

## 2021-07-09 DIAGNOSIS — Z510 Encounter for antineoplastic radiation therapy: Secondary | ICD-10-CM | POA: Diagnosis not present

## 2021-07-12 ENCOUNTER — Other Ambulatory Visit: Payer: Self-pay

## 2021-07-12 ENCOUNTER — Ambulatory Visit
Admission: RE | Admit: 2021-07-12 | Discharge: 2021-07-12 | Disposition: A | Discharge status: Home / Self Care | Payer: 59 | Source: Ambulatory Visit | Attending: Radiation Oncology | Admitting: Radiation Oncology

## 2021-07-12 DIAGNOSIS — Z510 Encounter for antineoplastic radiation therapy: Secondary | ICD-10-CM | POA: Diagnosis not present

## 2021-07-13 ENCOUNTER — Ambulatory Visit
Admission: RE | Admit: 2021-07-13 | Discharge: 2021-07-13 | Disposition: A | Discharge status: Home / Self Care | Payer: 59 | Source: Ambulatory Visit | Attending: Radiation Oncology | Admitting: Radiation Oncology

## 2021-07-13 DIAGNOSIS — Z510 Encounter for antineoplastic radiation therapy: Secondary | ICD-10-CM | POA: Diagnosis not present

## 2021-07-14 ENCOUNTER — Ambulatory Visit
Admission: RE | Admit: 2021-07-14 | Discharge: 2021-07-14 | Disposition: A | Discharge status: Home / Self Care | Payer: 59 | Source: Ambulatory Visit | Attending: Radiation Oncology | Admitting: Radiation Oncology

## 2021-07-14 ENCOUNTER — Other Ambulatory Visit: Payer: Self-pay

## 2021-07-14 DIAGNOSIS — Z510 Encounter for antineoplastic radiation therapy: Secondary | ICD-10-CM | POA: Diagnosis not present

## 2021-07-15 ENCOUNTER — Ambulatory Visit
Admission: RE | Admit: 2021-07-15 | Discharge: 2021-07-15 | Disposition: A | Discharge status: Home / Self Care | Payer: 59 | Source: Ambulatory Visit | Attending: Radiation Oncology | Admitting: Radiation Oncology

## 2021-07-15 DIAGNOSIS — Z510 Encounter for antineoplastic radiation therapy: Secondary | ICD-10-CM | POA: Diagnosis not present

## 2021-07-16 ENCOUNTER — Ambulatory Visit
Admission: RE | Admit: 2021-07-16 | Discharge: 2021-07-16 | Disposition: A | Discharge status: Home / Self Care | Payer: 59 | Source: Ambulatory Visit | Attending: Radiation Oncology | Admitting: Radiation Oncology

## 2021-07-16 ENCOUNTER — Other Ambulatory Visit: Payer: Self-pay

## 2021-07-16 DIAGNOSIS — Z510 Encounter for antineoplastic radiation therapy: Secondary | ICD-10-CM | POA: Diagnosis not present

## 2021-07-19 ENCOUNTER — Ambulatory Visit
Admission: RE | Admit: 2021-07-19 | Discharge: 2021-07-19 | Disposition: A | Discharge status: Home / Self Care | Payer: 59 | Source: Ambulatory Visit | Attending: Radiation Oncology | Admitting: Radiation Oncology

## 2021-07-19 ENCOUNTER — Other Ambulatory Visit: Payer: Self-pay

## 2021-07-19 DIAGNOSIS — Z510 Encounter for antineoplastic radiation therapy: Secondary | ICD-10-CM | POA: Diagnosis not present

## 2021-07-20 ENCOUNTER — Ambulatory Visit
Admission: RE | Admit: 2021-07-20 | Discharge: 2021-07-20 | Disposition: A | Discharge status: Home / Self Care | Payer: 59 | Source: Ambulatory Visit | Attending: Radiation Oncology | Admitting: Radiation Oncology

## 2021-07-20 DIAGNOSIS — Z510 Encounter for antineoplastic radiation therapy: Secondary | ICD-10-CM | POA: Diagnosis not present

## 2021-07-21 ENCOUNTER — Ambulatory Visit
Admission: RE | Admit: 2021-07-21 | Discharge: 2021-07-21 | Disposition: A | Discharge status: Home / Self Care | Payer: 59 | Source: Ambulatory Visit | Attending: Radiation Oncology | Admitting: Radiation Oncology

## 2021-07-21 ENCOUNTER — Other Ambulatory Visit: Payer: Self-pay

## 2021-07-21 DIAGNOSIS — Z510 Encounter for antineoplastic radiation therapy: Secondary | ICD-10-CM | POA: Diagnosis not present

## 2021-07-22 ENCOUNTER — Ambulatory Visit
Admission: RE | Admit: 2021-07-22 | Discharge: 2021-07-22 | Disposition: A | Discharge status: Home / Self Care | Payer: 59 | Source: Ambulatory Visit | Attending: Radiation Oncology | Admitting: Radiation Oncology

## 2021-07-22 DIAGNOSIS — Z510 Encounter for antineoplastic radiation therapy: Secondary | ICD-10-CM | POA: Diagnosis not present

## 2021-07-23 ENCOUNTER — Other Ambulatory Visit: Payer: Self-pay

## 2021-07-23 ENCOUNTER — Ambulatory Visit
Admission: RE | Admit: 2021-07-23 | Discharge: 2021-07-23 | Disposition: A | Discharge status: Home / Self Care | Payer: 59 | Source: Ambulatory Visit | Attending: Radiation Oncology | Admitting: Radiation Oncology

## 2021-07-23 DIAGNOSIS — Z510 Encounter for antineoplastic radiation therapy: Secondary | ICD-10-CM | POA: Diagnosis not present

## 2021-07-26 ENCOUNTER — Ambulatory Visit
Admission: RE | Admit: 2021-07-26 | Discharge: 2021-07-26 | Disposition: A | Discharge status: Home / Self Care | Payer: 59 | Source: Ambulatory Visit | Attending: Radiation Oncology | Admitting: Radiation Oncology

## 2021-07-26 ENCOUNTER — Other Ambulatory Visit: Payer: Self-pay

## 2021-07-26 DIAGNOSIS — Z510 Encounter for antineoplastic radiation therapy: Secondary | ICD-10-CM | POA: Diagnosis not present

## 2021-07-27 ENCOUNTER — Ambulatory Visit
Admission: RE | Admit: 2021-07-27 | Discharge: 2021-07-27 | Disposition: A | Discharge status: Home / Self Care | Payer: 59 | Source: Ambulatory Visit | Attending: Radiation Oncology | Admitting: Radiation Oncology

## 2021-07-27 DIAGNOSIS — C61 Malignant neoplasm of prostate: Secondary | ICD-10-CM | POA: Diagnosis present

## 2021-07-27 DIAGNOSIS — Z510 Encounter for antineoplastic radiation therapy: Secondary | ICD-10-CM | POA: Diagnosis not present

## 2021-07-28 ENCOUNTER — Ambulatory Visit
Admission: RE | Admit: 2021-07-28 | Discharge: 2021-07-28 | Disposition: A | Discharge status: Home / Self Care | Payer: 59 | Source: Ambulatory Visit | Attending: Radiation Oncology | Admitting: Radiation Oncology

## 2021-07-28 ENCOUNTER — Other Ambulatory Visit: Payer: Self-pay

## 2021-07-28 DIAGNOSIS — Z510 Encounter for antineoplastic radiation therapy: Secondary | ICD-10-CM | POA: Diagnosis not present

## 2021-07-29 ENCOUNTER — Ambulatory Visit
Admission: RE | Admit: 2021-07-29 | Discharge: 2021-07-29 | Disposition: A | Discharge status: Home / Self Care | Payer: 59 | Source: Ambulatory Visit | Attending: Radiation Oncology | Admitting: Radiation Oncology

## 2021-07-29 DIAGNOSIS — Z510 Encounter for antineoplastic radiation therapy: Secondary | ICD-10-CM | POA: Diagnosis not present

## 2021-07-30 ENCOUNTER — Other Ambulatory Visit: Payer: Self-pay

## 2021-07-30 ENCOUNTER — Ambulatory Visit
Admission: RE | Admit: 2021-07-30 | Discharge: 2021-07-30 | Disposition: A | Discharge status: Home / Self Care | Payer: 59 | Source: Ambulatory Visit | Attending: Radiation Oncology | Admitting: Radiation Oncology

## 2021-07-30 DIAGNOSIS — Z510 Encounter for antineoplastic radiation therapy: Secondary | ICD-10-CM | POA: Diagnosis not present

## 2021-08-02 ENCOUNTER — Other Ambulatory Visit: Payer: Self-pay

## 2021-08-02 ENCOUNTER — Ambulatory Visit
Admission: RE | Admit: 2021-08-02 | Discharge: 2021-08-02 | Disposition: A | Discharge status: Home / Self Care | Payer: 59 | Source: Ambulatory Visit | Attending: Radiation Oncology | Admitting: Radiation Oncology

## 2021-08-02 DIAGNOSIS — Z510 Encounter for antineoplastic radiation therapy: Secondary | ICD-10-CM | POA: Diagnosis not present

## 2021-08-03 ENCOUNTER — Ambulatory Visit
Admission: RE | Admit: 2021-08-03 | Discharge: 2021-08-03 | Disposition: A | Discharge status: Home / Self Care | Payer: 59 | Source: Ambulatory Visit | Attending: Radiation Oncology | Admitting: Radiation Oncology

## 2021-08-03 DIAGNOSIS — Z510 Encounter for antineoplastic radiation therapy: Secondary | ICD-10-CM | POA: Diagnosis not present

## 2021-08-04 ENCOUNTER — Other Ambulatory Visit: Payer: Self-pay

## 2021-08-04 ENCOUNTER — Ambulatory Visit
Admission: RE | Admit: 2021-08-04 | Discharge: 2021-08-04 | Disposition: A | Discharge status: Home / Self Care | Payer: 59 | Source: Ambulatory Visit | Attending: Radiation Oncology | Admitting: Radiation Oncology

## 2021-08-04 DIAGNOSIS — Z510 Encounter for antineoplastic radiation therapy: Secondary | ICD-10-CM | POA: Diagnosis not present

## 2021-08-05 ENCOUNTER — Ambulatory Visit
Admission: RE | Admit: 2021-08-05 | Discharge: 2021-08-05 | Disposition: A | Discharge status: Home / Self Care | Payer: 59 | Source: Ambulatory Visit | Attending: Radiation Oncology | Admitting: Radiation Oncology

## 2021-08-05 DIAGNOSIS — Z510 Encounter for antineoplastic radiation therapy: Secondary | ICD-10-CM | POA: Diagnosis not present

## 2021-08-06 ENCOUNTER — Other Ambulatory Visit: Payer: Self-pay

## 2021-08-06 ENCOUNTER — Ambulatory Visit
Admission: RE | Admit: 2021-08-06 | Discharge: 2021-08-06 | Disposition: A | Discharge status: Home / Self Care | Payer: 59 | Source: Ambulatory Visit | Attending: Radiation Oncology | Admitting: Radiation Oncology

## 2021-08-06 DIAGNOSIS — Z510 Encounter for antineoplastic radiation therapy: Secondary | ICD-10-CM | POA: Diagnosis not present

## 2021-08-09 ENCOUNTER — Ambulatory Visit: Payer: 59

## 2021-08-10 ENCOUNTER — Other Ambulatory Visit: Payer: Self-pay

## 2021-08-10 ENCOUNTER — Ambulatory Visit
Admission: RE | Admit: 2021-08-10 | Discharge: 2021-08-10 | Disposition: A | Discharge status: Home / Self Care | Payer: 59 | Source: Ambulatory Visit | Attending: Radiation Oncology | Admitting: Radiation Oncology

## 2021-08-10 DIAGNOSIS — Z510 Encounter for antineoplastic radiation therapy: Secondary | ICD-10-CM | POA: Diagnosis not present

## 2021-08-11 ENCOUNTER — Ambulatory Visit
Admission: RE | Admit: 2021-08-11 | Discharge: 2021-08-11 | Disposition: A | Discharge status: Home / Self Care | Payer: 59 | Source: Ambulatory Visit | Attending: Radiation Oncology | Admitting: Radiation Oncology

## 2021-08-11 DIAGNOSIS — Z510 Encounter for antineoplastic radiation therapy: Secondary | ICD-10-CM | POA: Diagnosis not present

## 2021-08-12 ENCOUNTER — Ambulatory Visit
Admission: RE | Admit: 2021-08-12 | Discharge: 2021-08-12 | Disposition: A | Discharge status: Home / Self Care | Payer: 59 | Source: Ambulatory Visit | Attending: Radiation Oncology | Admitting: Radiation Oncology

## 2021-08-12 ENCOUNTER — Other Ambulatory Visit: Payer: Self-pay

## 2021-08-12 DIAGNOSIS — Z510 Encounter for antineoplastic radiation therapy: Secondary | ICD-10-CM | POA: Diagnosis not present

## 2021-08-13 ENCOUNTER — Ambulatory Visit
Admission: RE | Admit: 2021-08-13 | Discharge: 2021-08-13 | Disposition: A | Discharge status: Home / Self Care | Payer: 59 | Source: Ambulatory Visit | Attending: Radiation Oncology | Admitting: Radiation Oncology

## 2021-08-13 DIAGNOSIS — Z510 Encounter for antineoplastic radiation therapy: Secondary | ICD-10-CM | POA: Diagnosis not present

## 2021-08-16 ENCOUNTER — Ambulatory Visit
Admission: RE | Admit: 2021-08-16 | Discharge: 2021-08-16 | Disposition: A | Discharge status: Home / Self Care | Payer: 59 | Source: Ambulatory Visit | Attending: Radiation Oncology | Admitting: Radiation Oncology

## 2021-08-16 ENCOUNTER — Other Ambulatory Visit: Payer: Self-pay

## 2021-08-16 DIAGNOSIS — Z510 Encounter for antineoplastic radiation therapy: Secondary | ICD-10-CM | POA: Diagnosis not present

## 2021-08-17 ENCOUNTER — Ambulatory Visit: Payer: 59

## 2021-08-18 ENCOUNTER — Ambulatory Visit: Payer: 59

## 2021-08-23 ENCOUNTER — Ambulatory Visit
Admission: RE | Admit: 2021-08-23 | Discharge: 2021-08-23 | Disposition: A | Discharge status: Home / Self Care | Payer: 59 | Source: Ambulatory Visit | Attending: Radiation Oncology | Admitting: Radiation Oncology

## 2021-08-23 ENCOUNTER — Other Ambulatory Visit: Payer: Self-pay

## 2021-08-23 DIAGNOSIS — Z510 Encounter for antineoplastic radiation therapy: Secondary | ICD-10-CM | POA: Diagnosis not present

## 2021-08-24 ENCOUNTER — Ambulatory Visit
Admission: RE | Admit: 2021-08-24 | Discharge: 2021-08-24 | Disposition: A | Discharge status: Home / Self Care | Payer: 59 | Source: Ambulatory Visit | Attending: Radiation Oncology | Admitting: Radiation Oncology

## 2021-08-24 DIAGNOSIS — Z510 Encounter for antineoplastic radiation therapy: Secondary | ICD-10-CM | POA: Diagnosis not present

## 2021-08-25 ENCOUNTER — Other Ambulatory Visit: Payer: Self-pay

## 2021-08-25 ENCOUNTER — Ambulatory Visit: Payer: 59

## 2021-08-25 ENCOUNTER — Ambulatory Visit
Admission: RE | Admit: 2021-08-25 | Discharge: 2021-08-25 | Disposition: A | Discharge status: Home / Self Care | Payer: 59 | Source: Ambulatory Visit | Attending: Radiation Oncology | Admitting: Radiation Oncology

## 2021-08-25 DIAGNOSIS — Z510 Encounter for antineoplastic radiation therapy: Secondary | ICD-10-CM | POA: Diagnosis not present

## 2021-08-26 ENCOUNTER — Ambulatory Visit
Admission: RE | Admit: 2021-08-26 | Discharge: 2021-08-26 | Disposition: A | Discharge status: Home / Self Care | Payer: 59 | Source: Ambulatory Visit | Attending: Radiation Oncology | Admitting: Radiation Oncology

## 2021-08-26 ENCOUNTER — Ambulatory Visit: Payer: 59 | Admitting: Nurse Practitioner

## 2021-08-26 ENCOUNTER — Ambulatory Visit: Payer: 59

## 2021-08-26 ENCOUNTER — Encounter: Payer: Self-pay | Admitting: Urology

## 2021-08-26 DIAGNOSIS — C61 Malignant neoplasm of prostate: Secondary | ICD-10-CM

## 2021-08-26 DIAGNOSIS — Z510 Encounter for antineoplastic radiation therapy: Secondary | ICD-10-CM | POA: Insufficient documentation

## 2021-08-27 ENCOUNTER — Other Ambulatory Visit: Payer: Self-pay

## 2021-08-27 ENCOUNTER — Ambulatory Visit
Admission: RE | Admit: 2021-08-27 | Discharge: 2021-08-27 | Disposition: A | Discharge status: Home / Self Care | Payer: 59 | Source: Ambulatory Visit | Attending: Radiation Oncology | Admitting: Radiation Oncology

## 2021-08-27 DIAGNOSIS — Z510 Encounter for antineoplastic radiation therapy: Secondary | ICD-10-CM | POA: Diagnosis not present

## 2021-08-30 ENCOUNTER — Ambulatory Visit
Admission: RE | Admit: 2021-08-30 | Discharge: 2021-08-30 | Disposition: A | Discharge status: Home / Self Care | Payer: 59 | Source: Ambulatory Visit | Attending: Radiation Oncology | Admitting: Radiation Oncology

## 2021-08-30 ENCOUNTER — Other Ambulatory Visit: Payer: Self-pay

## 2021-08-30 DIAGNOSIS — Z510 Encounter for antineoplastic radiation therapy: Secondary | ICD-10-CM | POA: Diagnosis not present

## 2021-09-06 ENCOUNTER — Other Ambulatory Visit: Payer: Self-pay

## 2021-09-06 ENCOUNTER — Encounter: Payer: Self-pay | Admitting: Nurse Practitioner

## 2021-09-06 ENCOUNTER — Ambulatory Visit (INDEPENDENT_AMBULATORY_CARE_PROVIDER_SITE_OTHER): Payer: 59 | Admitting: Nurse Practitioner

## 2021-09-06 DIAGNOSIS — E7800 Pure hypercholesterolemia, unspecified: Secondary | ICD-10-CM

## 2021-09-06 DIAGNOSIS — G4700 Insomnia, unspecified: Secondary | ICD-10-CM

## 2021-09-06 DIAGNOSIS — E1165 Type 2 diabetes mellitus with hyperglycemia: Secondary | ICD-10-CM | POA: Diagnosis not present

## 2021-09-06 DIAGNOSIS — M5442 Lumbago with sciatica, left side: Secondary | ICD-10-CM | POA: Diagnosis not present

## 2021-09-06 DIAGNOSIS — I10 Essential (primary) hypertension: Secondary | ICD-10-CM

## 2021-09-06 DIAGNOSIS — G8929 Other chronic pain: Secondary | ICD-10-CM | POA: Insufficient documentation

## 2021-09-06 MED ORDER — QUETIAPINE FUMARATE 50 MG PO TABS: 50.0000 mg | ORAL_TABLET | Freq: Every day | ORAL | 3 refills | Status: DC

## 2021-09-06 MED ORDER — GABAPENTIN 300 MG PO CAPS: 300.0000 mg | ORAL_CAPSULE | Freq: Every day | ORAL | 1 refills | Status: DC

## 2021-09-06 NOTE — Patient Instructions (Signed)
Please have fasting labs drawn as soon as possible.

## 2021-09-06 NOTE — Assessment & Plan Note (Signed)
-  he stopped taking 5-hour energies -BP has been around 145/80s -still taking amlodipine

## 2021-09-06 NOTE — Assessment & Plan Note (Signed)
-  had back surgery in the 90s  -saw Dr. Christella Noa in 2019 -saw Dr. Constance Haw recently to evaluate if lipoma was causing pain, but this was unlikely; CT was too expensive for pt at that time so it was deferred -he has pain radiating down his leg -Rx. Gabapentin -referral to neurosurgery again -if neurosurgery can't help, will consider PT pr physical medicine

## 2021-09-06 NOTE — Progress Notes (Signed)
Acute Office Visit  Subjective:    Patient ID: Robert Mcintyre, male    DOB: 07-27-1966, 55 y.o.   MRN: [id number]  Chief Complaint  Patient presents with   Follow-up    Follow up on medications    HPI Patient is in today for back pain, T2DM, HTN, and HLD.  He states he has had back pain for over 20 years and the tingling in his left foot is getting worse.  He states he has trouble sleeping, and that trazodone isn't helping him.  Past Medical History:  Diagnosis Date   Arthritis    back (had surgery 98), knees   Asthma    uses albuterol during cold weather   Hypertension    Morbid obesity ([place])    Periumbilical hernia    pt unaware   Prostate cancer ([place])    prostate cancer around 2018, had surgery   Pure hypercholesterolemia 04/24/2020   Spondylolysis, lumbar region     Past Surgical History:  Procedure Laterality Date   COLONOSCOPY N/A 09/16/2016   Procedure: COLONOSCOPY;  Surgeon: Danie Binder, MD;  [facility];  Service: Endoscopy;  Laterality: N/A;  1:00 PM   LYMPHADENECTOMY Bilateral 04/09/2018   Procedure: LYMPHADENECTOMY;  Surgeon: Raynelle Bring, MD;  [facility];  Service: Urology;  Laterality: Bilateral;   POLYPECTOMY  09/16/2016   Procedure: POLYPECTOMY;  Surgeon: Danie Binder, MD;  [facility];  Service: Endoscopy;;  ascending colon polyp, hepatic flexure polyp, descending colon polyp,    ROBOT ASSISTED LAPAROSCOPIC RADICAL PROSTATECTOMY N/A 04/09/2018   Procedure: XI ROBOTIC ASSISTED LAPAROSCOPIC RADICAL PROSTATECTOMY LEVEL 3;  Surgeon: Raynelle Bring, MD;  [facility];  Service: Urology;  Laterality: N/A;  ONLY NEEDS 210 MIN FOR ALL PROCEDURES   SPINE SURGERY  05/1997   back fusion    Family History  Problem Relation Age of Onset   Heart disease Mother    Stroke Mother    Hypertension Mother    Diabetes Mother    Asthma Father    Hypertension Brother     Social History   Socioeconomic History   Marital  status: Single    Spouse name: Not on file   Number of children: 0   Years of education: 12   Highest education level: Not on file  Occupational History   Occupation: truck driver  Tobacco Use   Smoking status: Former    Packs/day: 0.25    Years: 3.00    Pack years: 0.75    Types: Cigarettes    Quit date: 1980    Years since quitting: 42.9   Smokeless tobacco: Never  Vaping Use   Vaping Use: Never used  Substance and Sexual Activity   Alcohol use: Yes    Comment: occasional; beer and some liquor; 6 pack last several weeks and moonshine lasts months   Drug use: No   Sexual activity: Not Currently  Other Topics Concern   Not on file  Social History Narrative   Lives with brother   Social Determinants of Health   Financial Resource Strain: Not on file  Food Insecurity: Not on file  Transportation Needs: Not on file  Physical Activity: Not on file  Stress: Not on file  Social Connections: Not on file  Intimate Partner Violence: Not on file    Outpatient Medications Prior to Visit  Medication Sig Dispense Refill   amLODipine (NORVASC) 10 MG tablet Take 1 tablet (10 mg total) by mouth  daily. 60 tablet 5   metFORMIN (GLUCOPHAGE-XR) 500 MG 24 hr tablet Take 1 tablet (500 mg total) by mouth in the morning, at noon, and at bedtime. 360 tablet 1   albuterol (PROAIR HFA) 108 (90 Base) MCG/ACT inhaler Inhale 2 puffs into the lungs every 6 (six) hours as needed for wheezing or shortness of breath. (Patient not taking: Reported on 09/06/2021) 18 g 0   Bepotastine Besilate (BEPREVE) 1.5 % SOLN Place 1 drop into both eyes 2 (two) times daily as needed. (Patient not taking: Reported on 09/06/2021) 10 mL 5   blood glucose meter kit and supplies Per insurance preference. Check blood glucose once a day. Dx E11.65 (Patient not taking: Reported on 09/06/2021) 1 each 11   budesonide-formoterol (SYMBICORT) 160-4.5 MCG/ACT inhaler Inhale 2 puffs into the lungs 2 (two) times daily. (Patient not  taking: Reported on 09/06/2021) 1 Inhaler 5   glimepiride (AMARYL) 2 MG tablet Take 1 tablet (2 mg total) by mouth daily with breakfast. (Patient not taking: Reported on 09/06/2021) 90 tablet 1   olmesartan (BENICAR) 20 MG tablet Take 1/2 (one-half) tablet by mouth once daily (Patient not taking: Reported on 09/06/2021) 15 tablet 0   rosuvastatin (CRESTOR) 5 MG tablet Take 1 tablet (5 mg total) by mouth at bedtime. (Patient not taking: Reported on 09/06/2021) 90 tablet 3   traZODone (DESYREL) 50 MG tablet Take 0.5-1 tablets (25-50 mg total) by mouth at bedtime as needed for sleep. (Patient not taking: Reported on 09/06/2021) 30 tablet 3   No facility-administered medications prior to visit.    Allergies  Allergen Reactions   Eggs Or Egg-Derived Products Nausea And Vomiting    Review of Systems  Constitutional: Negative.   Respiratory: Negative.    Cardiovascular: Negative.   Endocrine:       Doesn't check blood sugar  Musculoskeletal:  Positive for back pain.  Neurological:  Positive for numbness.  Psychiatric/Behavioral:  Positive for sleep disturbance. Negative for self-injury and suicidal ideas.       Objective:    Physical Exam  There were no vitals taken for this visit. Wt Readings from Last 3 Encounters:  06/04/21 (!) 307 lb 12.8 oz (139.6 kg)  12/01/20 (!) 313 lb (142 kg)  11/13/20 (!) 319 lb (144.7 kg)    Health Maintenance Due  Topic Date Due   OPHTHALMOLOGY EXAM  Never done   Zoster Vaccines- Shingrix (1 of 2) Never done   COVID-19 Vaccine (3 - Moderna risk series) 05/02/2020   FOOT EXAM  06/12/2020   Pneumococcal Vaccine 14-60 Years old (2 - PCV) 04/24/2021   INFLUENZA VACCINE  04/26/2021   HEMOGLOBIN A1C  04/29/2021    There are no preventive care reminders to display for this patient.   Lab Results  Component Value Date   TSH 1.380 09/13/2019   Lab Results  Component Value Date   WBC 7.6 10/30/2020   HGB 14.0 10/30/2020   HCT 42.1 10/30/2020    MCV 90 10/30/2020   PLT 294 10/30/2020   Lab Results  Component Value Date   NA 141 10/30/2020   K 4.6 10/30/2020   CO2 26 10/30/2020   GLUCOSE 112 (H) 10/30/2020   BUN 9 10/30/2020   CREATININE 1.05 10/30/2020   BILITOT 0.5 10/30/2020   ALKPHOS 94 10/30/2020   AST 11 10/30/2020   ALT 13 10/30/2020   PROT 6.9 10/30/2020   ALBUMIN 4.1 10/30/2020   CALCIUM 8.9 10/30/2020   ANIONGAP 9 04/06/2018   Lab  Results  Component Value Date   CHOL 133 10/30/2020   Lab Results  Component Value Date   HDL 38 (L) 10/30/2020   Lab Results  Component Value Date   LDLCALC 83 10/30/2020   Lab Results  Component Value Date   TRIG 58 10/30/2020   Lab Results  Component Value Date   CHOLHDL 3.4 01/24/2020   Lab Results  Component Value Date   HGBA1C 7.2 (H) 10/30/2020       Assessment & Plan:   Problem List Items Addressed This Visit       Cardiovascular and Mediastinum   Essential hypertension - Primary    -he stopped taking 5-hour energies -BP has been around 145/80s -still taking amlodipine        Endocrine   Poorly controlled diabetes mellitus ([place])    -he doesn't check his blood sugar -he hasn't been taking glimepiride -he has tingling in his left foot -takes metformin, but poor compliance per pharmacy record in [place]   Chronic left-sided low back pain with left-sided sciatica    -had back surgery in the 90s  -saw Dr. Christella Noa in 2019 -saw Dr. Constance Haw recently to evaluate if lipoma was causing pain, but this was unlikely; CT was too expensive for pt at that time so it was deferred -he has pain radiating down his leg -Rx. Gabapentin -referral to neurosurgery again -if neurosurgery can't help, will consider PT pr physical medicine      Relevant Medications   QUEtiapine (SEROQUEL) 50 MG tablet   gabapentin (NEURONTIN) 300 MG capsule   Other Relevant Orders   Ambulatory referral to Neurosurgery     Other   Pure  hypercholesterolemia    -lipid panel ordered      Insomnia, unspecified   Relevant Medications   QUEtiapine (SEROQUEL) 50 MG tablet     Meds ordered this encounter  Medications   QUEtiapine (SEROQUEL) 50 MG tablet    Sig: Take 1 tablet (50 mg total) by mouth at bedtime.    Dispense:  30 tablet    Refill:  3   gabapentin (NEURONTIN) 300 MG capsule    Sig: Take 1 capsule (300 mg total) by mouth at bedtime.    Dispense:  30 capsule    Refill:  1   Date:  09/06/2021   Location of Patient: Home Location of Provider: Office Consent was obtain for visit to be over via telehealth. I verified that I am speaking with the correct person using two identifiers.  I connected with  Michaela Corner on 09/06/21 via telephone and verified that I am speaking with the correct person using two identifiers.   I discussed the limitations of evaluation and management by telemedicine. The patient expressed understanding and agreed to proceed.  Time spent: 16 minutes   Noreene Larsson, NP

## 2021-09-06 NOTE — Assessment & Plan Note (Signed)
-  lipid panel ordered

## 2021-09-06 NOTE — Assessment & Plan Note (Addendum)
-  he doesn't check his blood sugar -he hasn't been taking glimepiride -he has tingling in his left foot -takes metformin, but poor compliance per pharmacy record in [place]

## 2021-09-08 ENCOUNTER — Other Ambulatory Visit: Payer: Self-pay | Admitting: *Deleted

## 2021-09-08 DIAGNOSIS — E7800 Pure hypercholesterolemia, unspecified: Secondary | ICD-10-CM

## 2021-09-09 ENCOUNTER — Other Ambulatory Visit: Payer: Self-pay

## 2021-09-09 ENCOUNTER — Other Ambulatory Visit: Payer: Self-pay | Admitting: Nurse Practitioner

## 2021-09-09 DIAGNOSIS — E1165 Type 2 diabetes mellitus with hyperglycemia: Secondary | ICD-10-CM

## 2021-09-09 DIAGNOSIS — I10 Essential (primary) hypertension: Secondary | ICD-10-CM

## 2021-09-09 DIAGNOSIS — E785 Hyperlipidemia, unspecified: Secondary | ICD-10-CM

## 2021-09-09 LAB — LIPID PANEL
Chol/HDL Ratio: 4.6 ratio (ref 0.0–5.0)
Cholesterol, Total: 183 mg/dL (ref 100–199)
HDL: 40 mg/dL (ref >39)
LDL Chol Calc (NIH): 130 mg/dL — ABNORMAL HIGH (ref 0–99)
Triglycerides: 68 mg/dL (ref 0–149)
VLDL Cholesterol Cal: 13 mg/dL (ref 5–40)

## 2021-09-09 MED ORDER — ROSUVASTATIN CALCIUM 5 MG PO TABS: 5.0000 mg | ORAL_TABLET | Freq: Every day | ORAL | 3 refills | Status: DC

## 2021-09-09 NOTE — Progress Notes (Signed)
I'm sorry. Not all of the lab orders were carried over to the lab, so they only got the 1 order for lipids instead of the blood count, metabolic panel, and A2Z that were supposed to be drawn. He will need to have more blood drawn to get the complete set... luckily he doesn't have to fast for that.

## 2021-09-09 NOTE — Progress Notes (Signed)
His cholesterol is elevated. Is he still taking his statin?

## 2021-09-28 ENCOUNTER — Encounter: Payer: Self-pay | Admitting: Urology

## 2021-09-28 NOTE — Progress Notes (Signed)
Patient reports nocturia x4 and sensation of bladder not empty post voiding. No other symptoms reported at this time.  Meaningful use complete. I-PSS score of 6 (mild).  Currently NO urinary management medications at this time. Urology follow-up scheduled for February 2023 w/ [facility] -per patient.  Patient notified of 9:30am-09/29/21 telephone appointment w/ Ashlyn Bruning PA-C and verbalized understanding.  Patient preferred contact # [phone]

## 2021-09-29 ENCOUNTER — Ambulatory Visit
Admission: RE | Admit: 2021-09-29 | Discharge: 2021-09-29 | Disposition: A | Discharge status: Home / Self Care | Payer: 59 | Source: Ambulatory Visit | Attending: Urology | Admitting: Urology

## 2021-09-29 DIAGNOSIS — C61 Malignant neoplasm of prostate: Secondary | ICD-10-CM

## 2021-09-29 DIAGNOSIS — R9721 Rising PSA following treatment for malignant neoplasm of prostate: Secondary | ICD-10-CM

## 2021-09-29 NOTE — Progress Notes (Signed)
Radiation Oncology         [phone] ________________________________  Name: BRODEN HOLT MRN: [id number]  Date: 09/29/2021  DOB: Feb 13, 1966  Post Treatment Note  CC: Noreene Larsson, NP  Raynelle Bring, MD  Diagnosis:   56 y.o. gentleman with biochemically recurrent adenocarcinoma of the prostate with a current PSA of 0.24 s/p RALP 03/2018 for Stage pT3aN0, Gleason 3+4 prostate cancer  Interval Since Last Radiation:  4 weeks, concurrent with ADT (58-monthEligard injection received on 06/11/2021) 07/01/21 - 08/30/21: 1. The prostate fossa and pelvic lymph nodes were initially treated to 45 Gy in 25 fractions of 1.8 Gy  2. The prostate fossa only was boosted to 68.4 Gy with 13 additional fractions of 1.8 Gy   Narrative:  I spoke with the patient to conduct his routine scheduled 1 month follow up visit via telephone to spare the patient unnecessary potential exposure in the healthcare setting during the current COVID-19 pandemic.  The patient was notified in advance and gave permission to proceed with this visit format.  He tolerated radiation treatment relatively well with only minor urinary irritation and modest fatigue.  He did report nocturia 3-4 times per night and increased daytime frequency but specifically denied any dysuria, gross hematuria, straining to void or incontinence.  He also reported some diarrhea which was improved with Imodium as needed.                              On review of systems, the patient states that he is doing well in general.  He continues with nocturia 3-4 times per night but denies any dysuria, gross hematuria, straining to void or incontinence.  He feels like, from a voiding standpoint, he is pretty close to back to his baseline.  He has continued with occasional bowel irritation resulting in loose stools/diarrhea depending on his diet.  He denies any pain with bowel movements or hematochezia.  He reports a healthy appetite and is maintaining his weight.  He has  continued with aggravating hot flashes and decreased stamina and libido but otherwise, continues to tolerate the ADT fairly well.  Overall, he is quite pleased with his progress to date.  ALLERGIES:  is allergic to eggs or egg-derived products.  Meds: Current Outpatient Medications  Medication Sig Dispense Refill   albuterol (PROAIR HFA) 108 (90 Base) MCG/ACT inhaler Inhale 2 puffs into the lungs every 6 (six) hours as needed for wheezing or shortness of breath. (Patient not taking: Reported on 09/06/2021) 18 g 0   amLODipine (NORVASC) 10 MG tablet Take 1 tablet (10 mg total) by mouth daily. 60 tablet 5   Bepotastine Besilate (BEPREVE) 1.5 % SOLN Place 1 drop into both eyes 2 (two) times daily as needed. (Patient not taking: Reported on 09/06/2021) 10 mL 5   blood glucose meter kit and supplies Per insurance preference. Check blood glucose once a day. Dx E11.65 (Patient not taking: Reported on 09/06/2021) 1 each 11   budesonide-formoterol (SYMBICORT) 160-4.5 MCG/ACT inhaler Inhale 2 puffs into the lungs 2 (two) times daily. (Patient not taking: Reported on 09/06/2021) 1 Inhaler 5   gabapentin (NEURONTIN) 300 MG capsule Take 1 capsule (300 mg total) by mouth at bedtime. 30 capsule 1   glimepiride (AMARYL) 2 MG tablet Take 1 tablet (2 mg total) by mouth daily with breakfast. (Patient not taking: Reported on 09/06/2021) 90 tablet 1   metFORMIN (GLUCOPHAGE-XR) 500 MG 24 hr  tablet Take 1 tablet (500 mg total) by mouth in the morning, at noon, and at bedtime. 360 tablet 1   olmesartan (BENICAR) 20 MG tablet Take 1/2 (one-half) tablet by mouth once daily (Patient not taking: Reported on 09/06/2021) 15 tablet 0   QUEtiapine (SEROQUEL) 50 MG tablet Take 1 tablet (50 mg total) by mouth at bedtime. 30 tablet 3   rosuvastatin (CRESTOR) 5 MG tablet Take 1 tablet (5 mg total) by mouth at bedtime. 90 tablet 3   No current facility-administered medications for this encounter.    Physical Findings:  vitals  were not taken for this visit.  Pain Assessment Pain Score: 0-No pain/10 Unable to assess due to telephone follow-up visit format.  Lab Findings: Lab Results  Component Value Date   WBC 7.6 10/30/2020   HGB 14.0 10/30/2020   HCT 42.1 10/30/2020   MCV 90 10/30/2020   PLT 294 10/30/2020     Radiographic Findings: No results found.  Impression/Plan: 1. 56 y.o. gentleman with biochemically recurrent adenocarcinoma of the prostate with a current PSA of 0.24 s/p RALP 03/2018 for Stage pT3aN0, Gleason 3+4 prostate cancer. He will continue to follow up with urology for ongoing PSA determinations and has an appointment scheduled with Dr. Alinda Money in February 2023. He understands what to expect with regards to PSA monitoring going forward. I will look forward to following his response to treatment via correspondence with urology, and would be happy to continue to participate in his care if clinically indicated. I encouraged him to call or return to the office if he has any questions regarding his previous radiation or possible radiation side effects. He was comfortable with this plan and will follow up as needed.     Nicholos Johns, PA-C

## 2021-09-29 NOTE — Progress Notes (Signed)
°  Radiation Oncology         [phone] ________________________________  Name: Robert Mcintyre MRN: [id number]  Date: 08/26/2021  DOB: 09-Nov-1965  End of Treatment Note  Diagnosis:   56 y.o. gentleman with biochemically recurrent adenocarcinoma of the prostate with a current PSA of 0.24 s/p RALP 03/2018 for Stage pT3aN0, Gleason 3+4 prostate cancer     Indication for treatment:  Curative, Definitive Radiotherapy       Radiation treatment dates:   07/01/21 - 08/30/21  Site/dose:  1. The prostate fossa and pelvic lymph nodes were initially treated to 45 Gy in 25 fractions of 1.8 Gy  2. The prostate fossa only was boosted to 68.4 Gy with 13 additional fractions of 1.8 Gy   Beams/energy:  1. The prostate fossa  and pelvic lymph nodes were initially treated using VMAT intensity modulated radiotherapy delivering 6 megavolt photons. Image guidance was performed with CB-CT studies prior to each fraction. He was immobilized with a body fix lower extremity mold.  2. The prostate fossa only was boosted using VMAT intensity modulated radiotherapy delivering 6 megavolt photons. Image guidance was performed with CB-CT studies prior to each fraction. He was immobilized with a body fix lower extremity mold.  Narrative: The patient tolerated radiation treatment relatively well with only minor urinary irritation and modest fatigue.  He did report nocturia 3-4 times per night and increased daytime frequency but specifically denied any dysuria, gross hematuria, straining to void or incontinence.  He also reported some diarrhea which was improved with Imodium as needed.  Plan: The patient has completed radiation treatment. He will return to radiation oncology clinic for routine followup in one month. I advised him to call or return sooner if he has any questions or concerns related to his recovery or treatment. ________________________________  Sheral Apley. Tammi Klippel, M.D.

## 2021-10-22 ENCOUNTER — Telehealth: Payer: Self-pay | Admitting: *Deleted

## 2021-11-01 DIAGNOSIS — R200 Anesthesia of skin: Secondary | ICD-10-CM | POA: Diagnosis not present

## 2021-11-01 DIAGNOSIS — M5442 Lumbago with sciatica, left side: Secondary | ICD-10-CM | POA: Diagnosis not present

## 2021-11-01 DIAGNOSIS — Z981 Arthrodesis status: Secondary | ICD-10-CM | POA: Diagnosis not present

## 2021-11-01 DIAGNOSIS — Z6841 Body Mass Index (BMI) 40.0 and over, adult: Secondary | ICD-10-CM | POA: Diagnosis not present

## 2021-11-02 LAB — CBC WITH DIFFERENTIAL/PLATELET
Basophils Absolute: 0 10*3/uL (ref 0.0–0.2)
Basos: 0 %
EOS (ABSOLUTE): 0.1 10*3/uL (ref 0.0–0.4)
Eos: 2 %
Hematocrit: 42 % (ref 37.5–51.0)
Hemoglobin: 13.7 g/dL (ref 13.0–17.7)
Immature Grans (Abs): 0 10*3/uL (ref 0.0–0.1)
Immature Granulocytes: 0 %
Lymphocytes Absolute: 0.7 10*3/uL (ref 0.7–3.1)
Lymphs: 10 %
MCH: 29.9 pg (ref 26.6–33.0)
MCHC: 32.6 g/dL (ref 31.5–35.7)
MCV: 92 fL (ref 79–97)
Monocytes Absolute: 0.8 10*3/uL (ref 0.1–0.9)
Monocytes: 11 %
Neutrophils Absolute: 5.8 10*3/uL (ref 1.4–7.0)
Neutrophils: 77 %
Platelets: 329 10*3/uL (ref 150–450)
RBC: 4.58 x10E6/uL (ref 4.14–5.80)
RDW: 12.3 % (ref 11.6–15.4)
WBC: 7.4 10*3/uL (ref 3.4–10.8)

## 2021-11-02 LAB — CMP14+EGFR
ALT: 12 IU/L (ref 0–44)
AST: 10 IU/L (ref 0–40)
Albumin/Globulin Ratio: 1.3 (ref 1.2–2.2)
Albumin: 4.2 g/dL (ref 3.8–4.9)
Alkaline Phosphatase: 103 IU/L (ref 44–121)
BUN/Creatinine Ratio: 11 (ref 9–20)
BUN: 13 mg/dL (ref 6–24)
Bilirubin Total: 0.6 mg/dL (ref 0.0–1.2)
CO2: 27 mmol/L (ref 20–29)
Calcium: 9.8 mg/dL (ref 8.7–10.2)
Chloride: 99 mmol/L (ref 96–106)
Creatinine, Ser: 1.16 mg/dL (ref 0.76–1.27)
Globulin, Total: 3.3 g/dL (ref 1.5–4.5)
Glucose: 159 mg/dL — ABNORMAL HIGH (ref 70–99)
Potassium: 4.6 mmol/L (ref 3.5–5.2)
Sodium: 140 mmol/L (ref 134–144)
Total Protein: 7.5 g/dL (ref 6.0–8.5)
eGFR: 73 mL/min/{1.73_m2} (ref >59)

## 2021-11-02 LAB — HEMOGLOBIN A1C
Est. average glucose Bld gHb Est-mCnc: 186 mg/dL
Hgb A1c MFr Bld: 8.1 % — ABNORMAL HIGH (ref 4.8–5.6)

## 2021-11-04 ENCOUNTER — Other Ambulatory Visit: Payer: Self-pay

## 2021-11-04 ENCOUNTER — Ambulatory Visit (INDEPENDENT_AMBULATORY_CARE_PROVIDER_SITE_OTHER): Payer: 59 | Admitting: Nurse Practitioner

## 2021-11-04 ENCOUNTER — Encounter: Payer: Self-pay | Admitting: Nurse Practitioner

## 2021-11-04 VITALS — BP 137/84 | HR 83 | Ht 68.0 in | Wt 304.1 lb

## 2021-11-04 DIAGNOSIS — M5442 Lumbago with sciatica, left side: Secondary | ICD-10-CM

## 2021-11-04 DIAGNOSIS — I10 Essential (primary) hypertension: Secondary | ICD-10-CM | POA: Diagnosis not present

## 2021-11-04 DIAGNOSIS — G8929 Other chronic pain: Secondary | ICD-10-CM

## 2021-11-04 DIAGNOSIS — E7800 Pure hypercholesterolemia, unspecified: Secondary | ICD-10-CM

## 2021-11-04 DIAGNOSIS — E1165 Type 2 diabetes mellitus with hyperglycemia: Secondary | ICD-10-CM

## 2021-11-04 DIAGNOSIS — Z23 Encounter for immunization: Secondary | ICD-10-CM | POA: Diagnosis not present

## 2021-11-04 MED ORDER — METFORMIN HCL ER 500 MG PO TB24: 500.0000 mg | ORAL_TABLET | Freq: Three times a day (TID) | ORAL | 1 refills | Status: DC

## 2021-11-04 MED ORDER — METFORMIN HCL ER 500 MG PO TB24: ORAL_TABLET | ORAL | 1 refills | Status: DC

## 2021-11-04 MED ORDER — OLMESARTAN MEDOXOMIL 20 MG PO TABS: ORAL_TABLET | ORAL | 0 refills | Status: DC

## 2021-11-04 MED ORDER — ROSUVASTATIN CALCIUM 10 MG PO TABS: 10.0000 mg | ORAL_TABLET | Freq: Every day | ORAL | 3 refills | Status: DC

## 2021-11-04 MED ORDER — GLIMEPIRIDE 2 MG PO TABS: 2.0000 mg | ORAL_TABLET | Freq: Every day | ORAL | 1 refills | Status: DC

## 2021-11-04 MED ORDER — IBUPROFEN 600 MG PO TABS: 600.0000 mg | ORAL_TABLET | Freq: Two times a day (BID) | ORAL | 0 refills | Status: DC | PRN

## 2021-11-04 NOTE — Assessment & Plan Note (Signed)
Patient does not want to take gabapentin. Rx ibuprofen 600 mg 2 times daily as needed. Patient told to continue Tylenol as needed.

## 2021-11-04 NOTE — Assessment & Plan Note (Signed)
LDL not at goal. Patient was taking Crestor 5 mg daily. Start Crestor 10 mg daily, recheck labs in 6 weeks, Eat a healthy diet, including lots of fruits and vegetables. Avoid foods with a lot of saturated and trans fats, such as red meat, butter, fried foods and cheese . Maintain a healthy weight.

## 2021-11-04 NOTE — Assessment & Plan Note (Signed)
DASH diet and commitment to daily physical activity for a minimum of 30 minutes discussed and encouraged, as a part of hypertension management. The importance of attaining a healthy weight is also discussed.  BP/Weight 11/04/2021 06/04/2021 12/01/2020 11/13/2020 10/16/2020 04/24/2020 0/63/4949  Systolic BP 447 395 844 171 278 718 367  Diastolic BP 84 85 79 79 255 84 80  Wt. (Lbs) 304.12 307.8 313 319 300 320 319  BMI 46.24 46.12 47.59 48.5 45.61 48.66 48.5   Continue amlodipine 10 mg daily. Patient has not been taking Benicar, patient told to start taking Benicar 10 mg daily, check CMP in 2 weeks.

## 2021-11-04 NOTE — Assessment & Plan Note (Signed)
Patient has been taking metformin 1000 mg daily. He has not been taking his Amaryl because he ran out.. Patient told to start taking Amaryl 2 mg daily.  Recheck A1c in 3 months, avoid sugar sweets soda. Diabetic eye exam scheduled today.  Medications refilled. Lab Results  Component Value Date   HGBA1C 8.1 (H) 11/01/2021

## 2021-11-04 NOTE — Assessment & Plan Note (Signed)
Patient educated on CDC recommendation for the vaccine. Verbal consent was obtained from the patient, vaccine administered by nurse, no sign of adverse reactions noted at this time. Patient education on arm soreness and use of tylenol or ibuprofen (if safe) for this patient  was discussed. Patient educated on the signs and symptoms of adverse effect and advise to contact the office if they occur °

## 2021-11-04 NOTE — Progress Notes (Signed)
° °  Robert Mcintyre     MRN: [id number]      DOB: [birth year]   HPI Robert Mcintyre is here for follow up and re-evaluation of chronic medical conditions, medication management and review of any available recent lab and radiology data.  Preventive health is updated, specifically  Cancer screening and Immunization.   Questions or concerns regarding consultations or procedures which the PT has had in the interim are  addressed. The PT denies any adverse reactions to current medications since the last visit.    Pt c/o chronic left side low back pain, pain radiates to his left leg. Had back surgery done in 1998, he used to take aleeve but stopped taking it due to having prostate cancer, takes tylenol extra strength, tylenol helps a little bit. He has had his prostate removed and finished 38 rounds of radiation.   He has been taking metformin 1000mg  ER once a day, not taking glimepiride because he ran out of med.  He has not been taking benicar, he ran out meds.   ROS Denies recent fever or chills. Denies sinus pressure, nasal congestion, ear pain or sore throat. Denies chest congestion, productive cough or wheezing. Denies chest pains, palpitations and leg swelling Denies abdominal pain, nausea, vomiting,diarrhea or constipation.   Denies dysuria, frequency, hesitancy or incontinence. Has left side low back joint pain,no swelling and limitation in mobility. Denies headaches, seizures, numbness, or tingling. Denies depression, anxiety or insomnia. Denies skin break down or rash.   PE  BP 137/84 (BP Location: Left Arm, Patient Position: Sitting, Cuff Size: Large)    Pulse 83    Ht 5\' 8"  (1.727 m)    Wt (!) 304 lb 1.9 oz (137.9 kg)    SpO2 100%    BMI 46.24 kg/m   Patient alert and oriented and in no cardiopulmonary distress.  Chest: Clear to auscultation bilaterally.  CVS: S1, S2 no murmurs, no S3.Regular rate.  ABD: Soft non tender.   Ext: No edema  MS: Adequate ROM spine, shoulders, hips  and knees., tenderness on palpation of left side of lower back   Skin: Intact, no ulcerations or rash noted.  Psych: Good eye contact, normal affect. Memory intact not anxious or depressed appearing.  CNS: CN 2-12 intact, power,  normal throughout.no focal deficits noted.   Assessment & Plan

## 2021-11-04 NOTE — Patient Instructions (Addendum)
Please get your labs done 3-5 days before your next visit. Please take Crestor 10mg  for your cholesterol.  Pleas take olmesartan 10mg  daily for your blood pressure, continue amlodipine 10mg  daily as well. Please schedule your diabetic eye exam today Please get tour flu vaccine and shingles vaccine today .    It is important that you exercise regularly at least 30 minutes 5 times a week.  Think about what you will eat, plan ahead. Choose " clean, green, fresh or frozen" over canned, processed or packaged foods which are more sugary, salty and fatty. 70 to 75% of food eaten should be vegetables and fruit. Three meals at set times with snacks allowed between meals, but they must be fruit or vegetables. Aim to eat over a 12 hour period , example 7 am to 7 pm, and STOP after  your last meal of the day. Drink water,generally about 64 ounces per day, no other drink is as healthy. Fruit juice is best enjoyed in a healthy way, by EATING the fruit.  Thanks for choosing [facility], we consider it a privelige to serve you.

## 2021-11-05 ENCOUNTER — Other Ambulatory Visit: Payer: Self-pay | Admitting: Neurosurgery

## 2021-11-05 DIAGNOSIS — M5442 Lumbago with sciatica, left side: Secondary | ICD-10-CM

## 2021-11-16 ENCOUNTER — Telehealth: Payer: Self-pay

## 2021-11-16 ENCOUNTER — Other Ambulatory Visit: Payer: Self-pay | Admitting: Nurse Practitioner

## 2021-11-16 DIAGNOSIS — E7800 Pure hypercholesterolemia, unspecified: Secondary | ICD-10-CM

## 2021-11-16 MED ORDER — ATORVASTATIN CALCIUM 10 MG PO TABS: 10.0000 mg | ORAL_TABLET | Freq: Every day | ORAL | 3 refills | Status: DC

## 2021-11-16 NOTE — Telephone Encounter (Signed)
Patient called said [facility] was out of this  medication rosuvastatin 10 mg and maybe get more in next week.  Please contact patient [phone] on next step for patient what should he do about this medicine.

## 2021-11-16 NOTE — Telephone Encounter (Signed)
Patient aware.

## 2021-11-23 ENCOUNTER — Ambulatory Visit: Payer: 59

## 2021-11-23 LAB — BASIC METABOLIC PANEL
BUN/Creatinine Ratio: 14 (ref 9–20)
BUN: 15 mg/dL (ref 6–24)
CO2: 27 mmol/L (ref 20–29)
Calcium: 9.9 mg/dL (ref 8.7–10.2)
Chloride: 100 mmol/L (ref 96–106)
Creatinine, Ser: 1.08 mg/dL (ref 0.76–1.27)
Glucose: 126 mg/dL — ABNORMAL HIGH (ref 70–99)
Potassium: 4.8 mmol/L (ref 3.5–5.2)
Sodium: 142 mmol/L (ref 134–144)
eGFR: 80 mL/min/{1.73_m2} (ref >59)

## 2021-11-23 LAB — CMP14+EGFR
ALT: 14 IU/L (ref 0–44)
AST: 13 IU/L (ref 0–40)
Albumin/Globulin Ratio: 1.6 (ref 1.2–2.2)
Albumin: 4.3 g/dL (ref 3.8–4.9)
Alkaline Phosphatase: 93 IU/L (ref 44–121)
BUN/Creatinine Ratio: 13 (ref 9–20)
BUN: 14 mg/dL (ref 6–24)
Bilirubin Total: 0.4 mg/dL (ref 0.0–1.2)
CO2: 26 mmol/L (ref 20–29)
Calcium: 9.8 mg/dL (ref 8.7–10.2)
Chloride: 101 mmol/L (ref 96–106)
Creatinine, Ser: 1.06 mg/dL (ref 0.76–1.27)
Globulin, Total: 2.7 g/dL (ref 1.5–4.5)
Glucose: 125 mg/dL — ABNORMAL HIGH (ref 70–99)
Potassium: 4.6 mmol/L (ref 3.5–5.2)
Sodium: 141 mmol/L (ref 134–144)
Total Protein: 7 g/dL (ref 6.0–8.5)
eGFR: 82 mL/min/{1.73_m2} (ref >59)

## 2021-11-23 LAB — LIPID PANEL
Chol/HDL Ratio: 3.4 ratio (ref 0.0–5.0)
Cholesterol, Total: 131 mg/dL (ref 100–199)
HDL: 38 mg/dL — ABNORMAL LOW (ref >39)
LDL Chol Calc (NIH): 78 mg/dL (ref 0–99)
Triglycerides: 77 mg/dL (ref 0–149)
VLDL Cholesterol Cal: 15 mg/dL (ref 5–40)

## 2021-11-29 ENCOUNTER — Other Ambulatory Visit: Payer: Self-pay | Admitting: Nurse Practitioner

## 2021-11-29 DIAGNOSIS — I10 Essential (primary) hypertension: Secondary | ICD-10-CM

## 2021-11-30 ENCOUNTER — Other Ambulatory Visit: Payer: Self-pay | Admitting: Nurse Practitioner

## 2021-11-30 DIAGNOSIS — I10 Essential (primary) hypertension: Secondary | ICD-10-CM

## 2021-11-30 MED ORDER — OLMESARTAN MEDOXOMIL 20 MG PO TABS: ORAL_TABLET | ORAL | 1 refills | Status: DC

## 2021-12-06 ENCOUNTER — Telehealth: Payer: Self-pay | Admitting: *Deleted

## 2021-12-11 ENCOUNTER — Ambulatory Visit
Admission: RE | Admit: 2021-12-11 | Discharge: 2021-12-11 | Disposition: A | Discharge status: Home / Self Care | Payer: 59 | Source: Ambulatory Visit | Attending: Neurosurgery | Admitting: Neurosurgery

## 2021-12-11 ENCOUNTER — Other Ambulatory Visit: Payer: Self-pay

## 2021-12-11 DIAGNOSIS — M5442 Lumbago with sciatica, left side: Secondary | ICD-10-CM

## 2021-12-11 MED ORDER — GADOBENATE DIMEGLUMINE 529 MG/ML IV SOLN
20.0000 mL | Freq: Once | INTRAVENOUS | Status: AC | PRN
  Administered 2021-12-11: 20 mL via INTRAVENOUS

## 2021-12-17 ENCOUNTER — Ambulatory Visit (INDEPENDENT_AMBULATORY_CARE_PROVIDER_SITE_OTHER): Payer: 59 | Admitting: Nurse Practitioner

## 2021-12-17 ENCOUNTER — Other Ambulatory Visit: Payer: Self-pay

## 2021-12-17 ENCOUNTER — Encounter: Payer: Self-pay | Admitting: Nurse Practitioner

## 2021-12-17 VITALS — BP 134/70 | HR 85 | Ht 68.0 in | Wt 310.0 lb

## 2021-12-17 DIAGNOSIS — E7800 Pure hypercholesterolemia, unspecified: Secondary | ICD-10-CM | POA: Diagnosis not present

## 2021-12-17 DIAGNOSIS — M5442 Lumbago with sciatica, left side: Secondary | ICD-10-CM

## 2021-12-17 DIAGNOSIS — I10 Essential (primary) hypertension: Secondary | ICD-10-CM

## 2021-12-17 DIAGNOSIS — Z139 Encounter for screening, unspecified: Secondary | ICD-10-CM | POA: Diagnosis not present

## 2021-12-17 DIAGNOSIS — E1165 Type 2 diabetes mellitus with hyperglycemia: Secondary | ICD-10-CM

## 2021-12-17 DIAGNOSIS — G8929 Other chronic pain: Secondary | ICD-10-CM

## 2021-12-17 MED ORDER — AMLODIPINE BESYLATE 10 MG PO TABS: 10.0000 mg | ORAL_TABLET | Freq: Every day | ORAL | 0 refills | Status: DC

## 2021-12-17 MED ORDER — IBUPROFEN 600 MG PO TABS: 600.0000 mg | ORAL_TABLET | Freq: Three times a day (TID) | ORAL | 0 refills | Status: DC

## 2021-12-17 MED ORDER — OLMESARTAN MEDOXOMIL 20 MG PO TABS: 20.0000 mg | ORAL_TABLET | Freq: Every day | ORAL | 0 refills | Status: DC

## 2021-12-17 NOTE — Assessment & Plan Note (Signed)
Chronic condition not taking gabapentin ?Continue ibuprofen 600 mg 3 times daily as needed ?Follow-up with neurosurgery, patient stated that he has upcoming appointment for this ?Recently had MRI done.  Stretching exercises encouraged ?

## 2021-12-17 NOTE — Assessment & Plan Note (Addendum)
Diabetic foot exam completed today, has upcoming diabetic eye exam in May ?Continue metformin 1000 mg daily, Amaryl 2 mg daily ?Avoid sugar  soda, sweets ?Engage in regular vigorous exercise ?

## 2021-12-17 NOTE — Patient Instructions (Addendum)
Please start taking benicar '20mg'$  daily, amlodipine '10mg'$  daily for your blood pressure, ?Pleas get your fasting labs done 3-5 days before your next visit.  ? ? ?It is important that you exercise regularly at least 30 minutes 5 times a week.  ?Think about what you will eat, plan ahead. ?Choose " clean, green, fresh or frozen" over canned, processed or packaged foods which are more sugary, salty and fatty. ?70 to 75% of food eaten should be vegetables and fruit. ?Three meals at set times with snacks allowed between meals, but they must be fruit or vegetables. ?Aim to eat over a 12 hour period , example 7 am to 7 pm, and STOP after  your last meal of the day. ?Drink water,generally about 64 ounces per day, no other drink is as healthy. Fruit juice is best enjoyed in a healthy way, by EATING the fruit. ? ?Thanks for choosing [facility], we consider it a privelige to serve you. ? ?

## 2021-12-17 NOTE — Progress Notes (Signed)
? ?  Robert Mcintyre     MRN: [id number]      DOB: 1966/02/12 ? ? ?HPI ?Robert Mcintyre with past medical history of essential hypertension, type 2 diabetes, p.o. hyper cholesterol thyroid pneumonia, degenerative joint disease of lumbar spine, chronic left-sided low back pain with left-sided sciatica is here for follow up for HTN and HLD. ? ?Robert Mcintyre has not been checking his BP at home, gave no reason for not checking BP.  Patient reports that Robert Mcintyre has been taking his blood pressure medication and diabetic medications as prescribed. 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  ?has upcoming eye exam in May.  ? ?The PT denies any adverse reactions to current medications since the last visit.  ? ?Patient complains of chronic lower back pain recently getting worse has upcoming appointment with neurosurgery recently had MRI done.  Robert Mcintyre has not been taking gabapentin but Robert Mcintyre has been taking ibuprofen as needed ibuprofen not helping his pain ? ?ROS ?Denies recent fever or chills. ?Denies sinus pressure, nasal congestion, ear pain or sore throat. ?Denies chest congestion, productive cough or wheezing. ?Denies chest pains, palpitations and leg swelling ?Denies abdominal pain, nausea, vomiting,diarrhea or constipation.   ?Denies dysuria, frequency, hesitancy or incontinence. ?Denies headaches, seizures, numbness, or tingling. ?Denies depression, anxiety or insomnia. ? ? ? ?PE ? ?BP (!) 142/70 (BP Location: Right Arm, Cuff Size: Large)   Pulse 85   Ht '5\' 8"'$  (1.727 m)   Wt (!) 310 lb (140.6 kg)   SpO2 95%   BMI 47.14 kg/m?  ? ?Patient alert and oriented and in no cardiopulmonary distress ? ?Chest: Clear to auscultation bilaterally. ? ?CVS: S1, S2 no murmurs, no S3.Regular rate. ? ?ABD: Soft non tender.  ? ?Ext: No edema ? ?MS: Adequate ROM spine, shoulders, hips and knees.  Tenderness on palpation of mid low back ? ?Psych: Good eye contact, normal affect. Memory intact not anxious or depressed appearing. ? ?CNS: CN 2-12 intact, power,   normal throughout.no focal deficits noted. ? ? ?Assessment & Plan ?Essential hypertension ?BP Readings from Last 3 Encounters:  ?12/17/21 (!) 142/70  ?11/04/21 137/84  ?06/04/21 (!) 148/85  ?TAKING AMLODIPINE '10MG'$  DALY, olmersartan '10mg'$  daily. ?Blood pressure uncontrolled ?Continue amlodipine 10 mg daily, start olmesartan 20 mg daily, BMP in 2 weeks. ?Monitor blood pressure at home keep a log and bring to next visit. ?DASH diet advised, exercise encouraged  ?Follow-up in 4 weeks ? ?Pure hypercholesterolemia ?Lab Results  ?Component Value Date  ? CHOL 131 11/22/2021  ? HDL 38 (L) 11/22/2021  ? [place] 78 11/22/2021  ? TRIG 77 11/22/2021  ? CHOLHDL 3.4 11/22/2021  ?On atorvastatin 10 mg daily ?Continue current medication, avoid fried fatty foods ?Check lipid panel at next visit. ? ?Chronic left-sided low back pain with left-sided sciatica ?Chronic condition not taking gabapentin ?Continue ibuprofen 600 mg 3 times daily as needed ?Follow-up with neurosurgery, patient stated that Robert Mcintyre has upcoming appointment for this ?Recently had MRI done.  Stretching exercises encouraged ? ?Poorly controlled diabetes mellitus ([place]) ?Diabetic foot exam completed today, has upcoming diabetic eye exam in May ?Continue metformin 1000 mg daily, Amaryl 2 mg daily ?Avoid sugar  soda, sweets ?Engage in regular vigorous exercise  ? ?

## 2021-12-17 NOTE — Assessment & Plan Note (Signed)
Lab Results  ?Component Value Date  ? CHOL 131 11/22/2021  ? HDL 38 (L) 11/22/2021  ? [place] 78 11/22/2021  ? TRIG 77 11/22/2021  ? CHOLHDL 3.4 11/22/2021  ?On atorvastatin 10 mg daily ?Continue current medication, avoid fried fatty foods ?Check lipid panel at next visit. ?

## 2021-12-17 NOTE — Assessment & Plan Note (Addendum)
BP Readings from Last 3 Encounters:  ?12/17/21 (!) 142/70  ?11/04/21 137/84  ?06/04/21 (!) 148/85  ?TAKING AMLODIPINE '10MG'$  DALY, olmersartan '10mg'$  daily. ?Blood pressure uncontrolled ?Continue amlodipine 10 mg daily, start olmesartan 20 mg daily, BMP in 2 weeks. ?Monitor blood pressure at home keep a log and bring to next visit. ?DASH diet advised, exercise encouraged  ?Follow-up in 4 weeks ?

## 2021-12-21 ENCOUNTER — Ambulatory Visit: Payer: 59

## 2022-02-22 ENCOUNTER — Encounter: Payer: 59 | Admitting: Nurse Practitioner

## 2022-04-04 ENCOUNTER — Other Ambulatory Visit: Payer: Self-pay | Admitting: Nurse Practitioner

## 2022-04-22 ENCOUNTER — Ambulatory Visit (INDEPENDENT_AMBULATORY_CARE_PROVIDER_SITE_OTHER): Payer: 59 | Admitting: Nurse Practitioner

## 2022-04-22 ENCOUNTER — Encounter: Payer: Self-pay | Admitting: Nurse Practitioner

## 2022-04-22 VITALS — BP 140/76 | HR 83 | Ht 68.0 in | Wt 318.0 lb

## 2022-04-22 DIAGNOSIS — E1165 Type 2 diabetes mellitus with hyperglycemia: Secondary | ICD-10-CM

## 2022-04-22 DIAGNOSIS — R5383 Other fatigue: Secondary | ICD-10-CM | POA: Diagnosis not present

## 2022-04-22 DIAGNOSIS — Z0000 Encounter for general adult medical examination without abnormal findings: Secondary | ICD-10-CM

## 2022-04-22 DIAGNOSIS — Z0001 Encounter for general adult medical examination with abnormal findings: Secondary | ICD-10-CM | POA: Insufficient documentation

## 2022-04-22 DIAGNOSIS — Z23 Encounter for immunization: Secondary | ICD-10-CM

## 2022-04-22 DIAGNOSIS — I10 Essential (primary) hypertension: Secondary | ICD-10-CM

## 2022-04-22 DIAGNOSIS — E7800 Pure hypercholesterolemia, unspecified: Secondary | ICD-10-CM

## 2022-04-22 NOTE — Assessment & Plan Note (Signed)
Lab Results  Component Value Date   CHOL 131 11/22/2021   HDL 38 (L) 11/22/2021   LDLCALC 78 11/22/2021   TRIG 77 11/22/2021   CHOLHDL 3.4 11/22/2021  Condition on atorvastatin 10 mg daily LDL goal is less than 70 Lipid panel ordered today Avoid fatty fried foods Lose weight

## 2022-04-22 NOTE — Patient Instructions (Addendum)
Shingles vaccine today  You blood pressure is not at goal of less than 130/80, we will get back to you about changes to your medications once we get your labs back.    Fasting blood work next week as dicussed    It is important that you exercise regularly at least 30 minutes 5 times a week.  Think about what you will eat, plan ahead. Choose " clean, green, fresh or frozen" over canned, processed or packaged foods which are more sugary, salty and fatty. 70 to 75% of food eaten should be vegetables and fruit. Three meals at set times with snacks allowed between meals, but they must be fruit or vegetables. Aim to eat over a 12 hour period , example 7 am to 7 pm, and STOP after  your last meal of the day. Drink water,generally about 64 ounces per day, no other drink is as healthy. Fruit juice is best enjoyed in a healthy way, by EATING the fruit.  Thanks for choosing [facility], we consider it a privelige to serve you.

## 2022-04-22 NOTE — Progress Notes (Signed)
Complete physical exam  Patient: ARLYNN MCDERMID   DOB: 13-Nov-1965   56 y.o. Male  MRN: [id number]  Subjective:    Chief Complaint  Patient presents with   Annual Exam    cpe    ERIS BRECK is a 56 y.o. male with past medical history of type 2 diabetes, essential hypertension, prostate cancer, morbid obesity, hyperlipidemia, who presents today for a complete physical exam.  Patient complains of chronic fatigue since the past 6 months stated that he has been having problems with fatigue states he had treatment for his prostate cancer.  He follows up with urologist every 6 months.  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  Patient denies fever, chills, unintentional weight loss.  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    Has not had his diabetic eye exam done yet , patient encouraged to get his diabetic eye exam done need for diabetic eye exam discussed  Most recent fall risk assessment:    04/22/2022   11:11 AM  [place] in the past year? 0  Number falls in past yr: 0  Injury with Fall? 0  Risk for fall due to : No Fall Risks  Follow up Falls evaluation completed     Most recent depression screenings:    04/22/2022   11:11 AM 12/17/2021    8:31 AM  PHQ 2/9 Scores  PHQ - 2 Score 0 0        Patient Care Team: Renee Rival, FNP as PCP - General (Nurse Practitioner) Raynelle Bring, MD as Consulting Physician (Urology) Danie Binder, MD (Inactive) as Consulting Physician (Gastroenterology) Katheren Puller, RN as Oncology Nurse Navigator   Outpatient Medications Prior to Visit  Medication Sig Note   amLODipine (NORVASC) 10 MG tablet Take 1 tablet (10 mg total) by mouth daily.    atorvastatin (LIPITOR) 10 MG tablet Take 1 tablet (10 mg total) by mouth daily.    glimepiride (AMARYL) 2 MG tablet Take 1 tablet (2 mg total) by mouth daily with breakfast.    ibuprofen (ADVIL) 600 MG tablet Take 1 tablet (600 mg total) by mouth 3  (three) times daily. 04/22/2022: As needed   metFORMIN (GLUCOPHAGE-XR) 500 MG 24 hr tablet Take 1000 mg once daily    olmesartan (BENICAR) 20 MG tablet Take 1 tablet by mouth once daily    QUEtiapine (SEROQUEL) 50 MG tablet Take 1 tablet (50 mg total) by mouth at bedtime.    albuterol (PROAIR HFA) 108 (90 Base) MCG/ACT inhaler Inhale 2 puffs into the lungs every 6 (six) hours as needed for wheezing or shortness of breath. (Patient not taking: Reported on 09/06/2021)    Bepotastine Besilate (BEPREVE) 1.5 % SOLN Place 1 drop into both eyes 2 (two) times daily as needed. (Patient not taking: Reported on 09/06/2021)    blood glucose meter kit and supplies Per insurance preference. Check blood glucose once a day. Dx E11.65 (Patient not taking: Reported on 09/06/2021)    gabapentin (NEURONTIN) 300 MG capsule Take 1 capsule (300 mg total) by mouth at bedtime. (Patient not taking: Reported on 11/04/2021)    [DISCONTINUED] budesonide-formoterol (SYMBICORT) 160-4.5 MCG/ACT inhaler Inhale 2 puffs into the lungs 2 (two) times daily. (Patient not taking: Reported on 09/06/2021)    No facility-administered medications prior to visit.    Review of Systems  Constitutional:  Positive for malaise/fatigue. Negative for chills, diaphoresis, fever and weight loss.  HENT: Negative.  Negative for ear discharge, ear pain, hearing loss and tinnitus.   Eyes: Negative.  Negative for blurred vision, double vision, photophobia and pain.  Respiratory: Negative.  Negative for cough, hemoptysis, sputum production and shortness of breath.   Cardiovascular: Negative.  Negative for chest pain, palpitations, orthopnea and claudication.  Gastrointestinal: Negative.  Negative for abdominal pain, diarrhea, heartburn, nausea and vomiting.  Genitourinary: Negative.  Negative for dysuria, flank pain, frequency, hematuria and urgency.  Musculoskeletal: Negative.  Negative for back pain, joint pain, myalgias and neck pain.  Skin:  Negative  for itching and rash.  Neurological: Negative.  Negative for dizziness, tingling, tremors, sensory change, speech change and headaches.  Endo/Heme/Allergies: Negative.  Negative for environmental allergies and polydipsia.  Psychiatric/Behavioral: Negative.  Negative for depression, hallucinations, substance abuse and suicidal ideas. The patient is not nervous/anxious.           Objective:     BP 140/76   Pulse 83   Ht '5\' 8"'  (1.727 m)   Wt (!) 318 lb (144.2 kg)   SpO2 92%   BMI 48.35 kg/m    Physical Exam Constitutional:      General: He is not in acute distress.    Appearance: He is obese. He is not ill-appearing, toxic-appearing or diaphoretic.  HENT:     Head: Normocephalic and atraumatic.     Right Ear: Tympanic membrane, ear canal and external ear normal. There is no impacted cerumen.     Left Ear: Tympanic membrane, ear canal and external ear normal. There is no impacted cerumen.     Nose: Nose normal. No congestion or rhinorrhea.     Mouth/Throat:     Mouth: Mucous membranes are moist.     Pharynx: Oropharynx is clear. No oropharyngeal exudate or posterior oropharyngeal erythema.  Eyes:     General: No scleral icterus.       Right eye: No discharge.        Left eye: No discharge.     Extraocular Movements: Extraocular movements intact.     Conjunctiva/sclera: Conjunctivae normal.     Pupils: Pupils are equal, round, and reactive to light.  Neck:     Vascular: No carotid bruit.  Cardiovascular:     Rate and Rhythm: Regular rhythm.     Pulses: Normal pulses.     Heart sounds: Normal heart sounds. No murmur heard.    No friction rub. No gallop.  Pulmonary:     Effort: Pulmonary effort is normal. No respiratory distress.     Breath sounds: Normal breath sounds. No stridor. No wheezing, rhonchi or rales.  Chest:     Chest wall: No tenderness.  Abdominal:     General: There is no distension.     Palpations: Abdomen is soft. There is no mass.     Tenderness:  There is no abdominal tenderness. There is no right CVA tenderness, left CVA tenderness, guarding or rebound.     Hernia: No hernia is present.  Musculoskeletal:        General: No swelling, tenderness, deformity or signs of injury. Normal range of motion.     Cervical back: Normal range of motion and neck supple. No rigidity or tenderness.     Right lower leg: No edema.     Left lower leg: No edema.  Lymphadenopathy:     Cervical: No cervical adenopathy.  Skin:    General: Skin is warm and dry.     Capillary Refill: Capillary refill takes less than  2 seconds.     Coloration: Skin is not jaundiced or pale.     Findings: No bruising, erythema, lesion or rash.  Neurological:     Mental Status: He is alert and oriented to person, place, and time.     Cranial Nerves: No cranial nerve deficit.     Sensory: No sensory deficit.     Motor: No weakness.     Coordination: Coordination normal.     Gait: Gait normal.     Deep Tendon Reflexes: Reflexes normal.  Psychiatric:        Mood and Affect: Mood normal.        Behavior: Behavior normal.        Thought Content: Thought content normal.        Judgment: Judgment normal.      No results found for any visits on 04/22/22.     Assessment & Plan:    Routine Health Maintenance and Physical Exam  Immunization History  Administered Date(s) Administered   Influenza,inj,Quad PF,6+ Mos 07/18/2016, 07/28/2017, 06/13/2019, 11/04/2021   Moderna Sars-Covid-2 Vaccination 03/07/2020, 04/04/2020   Pneumococcal Polysaccharide-23 04/24/2020   Tdap 07/18/2016   Zoster Recombinat (Shingrix) 11/04/2021    Health Maintenance  Topic Date Due   OPHTHALMOLOGY EXAM  Never done   COVID-19 Vaccine (3 - Moderna risk series) 05/02/2020   Zoster Vaccines- Shingrix (2 of 2) 12/30/2021   INFLUENZA VACCINE  04/26/2022   HEMOGLOBIN A1C  05/01/2022   FOOT EXAM  12/18/2022   TETANUS/TDAP  07/18/2026   COLONOSCOPY (Pts 45-42yr Insurance coverage will need to  be confirmed)  09/16/2026   HPV VACCINES  Aged Out   Hepatitis C Screening  Discontinued   HIV Screening  Discontinued    Discussed health benefits of physical activity, and encouraged him to engage in regular exercise appropriate for his age and condition.  Problem List Items Addressed This Visit       Cardiovascular and Mediastinum   Essential hypertension    BP Readings from Last 3 Encounters:  04/22/22 140/76  12/17/21 134/70  11/04/21 137/84  Currently on amlodipine 10 mg daily, olmesartan 20 mg daily He has not been monitoring his blood pressure at home Blood pressure goal is less than 130/80 We will plan to increase dose of olmesartan once lab results Plan discussed with patient -Diet advised engage in regular vigorous exercising at least 150 minutes weekly  follow-up in 2 months        Endocrine   Type 2 diabetes mellitus with hyperglycemia, without long-term current use of insulin [place]    Lab Results  Component Value Date   HGBA1C 8.1 (H) 11/01/2021  Chronic uncontrolled condition Patient states that he has been drinking cherry wine soda Need to avoid sugar sweets soda and drink water instead discussed with patient Continue glimepiride 2 mg daily, metformin 1000 mg daily. I discussed the need for ozempic  Injection if  A1c remains higher than 7 A1c, urine creatinine labs ordered today.  Encouraged to get his diabetic eye exam done      Relevant Orders   CMP14+EGFR   HgB A1c   Urine microalbumin-creatinine with uACR     Other   Pure hypercholesterolemia    Lab Results  Component Value Date   CHOL 131 11/22/2021   HDL 38 (L) 11/22/2021   LDLCALC 78 11/22/2021   TRIG 77 11/22/2021   CHOLHDL 3.4 11/22/2021  Condition on atorvastatin 10 mg daily LDL goal is less than 70  Lipid panel ordered today Avoid fatty fried foods Lose weight      Relevant Orders   Lipid Profile   Annual physical exam    Annual exam as documented.  Counseling done  include healthy lifestyle involving committing to 150 minutes of exercise per week, heart healthy diet, and attaining healthy weight. The importance of adequate sleep also discussed.  Regular use of seat belt and home safety were also discussed . Changes in health habits are decided on by patient with goals and time frames set for achieving them. Shingles vaccine today         Relevant Orders   TSH   Vitamin D (25 hydroxy)   Fatigue    Started since he had radiation treatment for his prostate cancer Check vitamin D levels, TSH Engage in regular moderate to vigorous exercises at least 150 minutes weekly Patient encouraged to maintain close follow-up with urology for possible testosterone replacement.  Patient mentioned that he was told his testosterone level is low       Return in about 2 months (around 06/23/2022).     Renee Rival, FNP

## 2022-04-22 NOTE — Assessment & Plan Note (Addendum)
Started since he had radiation treatment for his prostate cancer Check vitamin D levels, TSH Engage in regular moderate to vigorous exercises at least 150 minutes weekly Patient encouraged to maintain close follow-up with urology for possible testosterone replacement.  Patient mentioned that he was told his testosterone level is low

## 2022-04-22 NOTE — Assessment & Plan Note (Addendum)
Lab Results  Component Value Date   HGBA1C 8.1 (H) 11/01/2021  Chronic uncontrolled condition Patient states that he has been drinking cherry wine soda Need to avoid sugar sweets soda and drink water instead discussed with patient Continue glimepiride 2 mg daily, metformin 1000 mg daily. I discussed the need for ozempic  Injection if  A1c remains higher than 7 A1c, urine creatinine labs ordered today.  Encouraged to get his diabetic eye exam done

## 2022-04-22 NOTE — Assessment & Plan Note (Signed)
BP Readings from Last 3 Encounters:  04/22/22 140/76  12/17/21 134/70  11/04/21 137/84  Currently on amlodipine 10 mg daily, olmesartan 20 mg daily He has not been monitoring his blood pressure at home Blood pressure goal is less than 130/80 We will plan to increase dose of olmesartan once lab results Plan discussed with patient -Diet advised engage in regular vigorous exercising at least 150 minutes weekly  follow-up in 2 months

## 2022-04-22 NOTE — Addendum Note (Signed)
Addended by: Jill Side on: 04/22/2022 01:13 PM   Modules accepted: Orders

## 2022-04-22 NOTE — Assessment & Plan Note (Signed)
Annual exam as documented.  Counseling done include healthy lifestyle involving committing to 150 minutes of exercise per week, heart healthy diet, and attaining healthy weight. The importance of adequate sleep also discussed.  Regular use of seat belt and home safety were also discussed . Changes in health habits are decided on by patient with goals and time frames set for achieving them. Shingles vaccine today

## 2022-04-28 ENCOUNTER — Other Ambulatory Visit: Payer: Self-pay | Admitting: Nurse Practitioner

## 2022-04-28 DIAGNOSIS — G4700 Insomnia, unspecified: Secondary | ICD-10-CM

## 2022-04-28 MED ORDER — QUETIAPINE FUMARATE 50 MG PO TABS: 50.0000 mg | ORAL_TABLET | Freq: Every day | ORAL | 3 refills | Status: DC

## 2022-04-30 ENCOUNTER — Other Ambulatory Visit: Payer: Self-pay | Admitting: Nurse Practitioner

## 2022-04-30 DIAGNOSIS — E559 Vitamin D deficiency, unspecified: Secondary | ICD-10-CM

## 2022-04-30 MED ORDER — VITAMIN D (ERGOCALCIFEROL) 1.25 MG (50000 UNIT) PO CAPS: 50000.0000 [IU] | ORAL_CAPSULE | ORAL | 0 refills | Status: DC

## 2022-04-30 NOTE — Progress Notes (Signed)
Good job on getting your diabetes under control, please continue current medications , avoid soda, sweets , sugar..   Vitamin D level is low , start vitamin D 50,000 units once weekly.

## 2022-05-01 LAB — CMP14+EGFR
ALT: 11 IU/L (ref 0–44)
AST: 8 IU/L (ref 0–40)
Albumin/Globulin Ratio: 1.4 (ref 1.2–2.2)
Albumin: 4 g/dL (ref 3.8–4.9)
Alkaline Phosphatase: 90 IU/L (ref 44–121)
BUN/Creatinine Ratio: 10 (ref 9–20)
BUN: 11 mg/dL (ref 6–24)
Bilirubin Total: 0.4 mg/dL (ref 0.0–1.2)
CO2: 27 mmol/L (ref 20–29)
Calcium: 9.1 mg/dL (ref 8.7–10.2)
Chloride: 101 mmol/L (ref 96–106)
Creatinine, Ser: 1.09 mg/dL (ref 0.76–1.27)
Globulin, Total: 2.8 g/dL (ref 1.5–4.5)
Glucose: 120 mg/dL — ABNORMAL HIGH (ref 70–99)
Potassium: 4.4 mmol/L (ref 3.5–5.2)
Sodium: 140 mmol/L (ref 134–144)
Total Protein: 6.8 g/dL (ref 6.0–8.5)
eGFR: 79 mL/min/{1.73_m2} (ref >59)

## 2022-05-01 LAB — LIPID PANEL
Chol/HDL Ratio: 2.9 ratio (ref 0.0–5.0)
Cholesterol, Total: 120 mg/dL (ref 100–199)
HDL: 42 mg/dL (ref >39)
LDL Chol Calc (NIH): 65 mg/dL (ref 0–99)
Triglycerides: 61 mg/dL (ref 0–149)
VLDL Cholesterol Cal: 13 mg/dL (ref 5–40)

## 2022-05-01 LAB — MICROALBUMIN / CREATININE URINE RATIO
Creatinine, Urine: 190.7 mg/dL
Microalb/Creat Ratio: 2 mg/g creat (ref 0–29)
Microalbumin, Urine: 3.7 ug/mL

## 2022-05-01 LAB — HEMOGLOBIN A1C
Est. average glucose Bld gHb Est-mCnc: 140 mg/dL
Hgb A1c MFr Bld: 6.5 % — ABNORMAL HIGH (ref 4.8–5.6)

## 2022-05-01 LAB — VITAMIN D 25 HYDROXY (VIT D DEFICIENCY, FRACTURES): Vit D, 25-Hydroxy: 13.7 ng/mL — ABNORMAL LOW (ref 30.0–100.0)

## 2022-05-01 LAB — TSH: TSH: 1.43 u[IU]/mL (ref 0.450–4.500)

## 2022-05-05 ENCOUNTER — Other Ambulatory Visit: Payer: Self-pay | Admitting: Nurse Practitioner

## 2022-05-05 DIAGNOSIS — I10 Essential (primary) hypertension: Secondary | ICD-10-CM

## 2022-05-06 MED ORDER — AMLODIPINE BESYLATE 10 MG PO TABS: 10.0000 mg | ORAL_TABLET | Freq: Every day | ORAL | 0 refills | Status: DC

## 2022-05-06 MED ORDER — OLMESARTAN MEDOXOMIL 20 MG PO TABS: 20.0000 mg | ORAL_TABLET | Freq: Every day | ORAL | 0 refills | Status: DC

## 2022-05-18 ENCOUNTER — Other Ambulatory Visit: Payer: Self-pay | Admitting: Nurse Practitioner

## 2022-05-18 DIAGNOSIS — E1165 Type 2 diabetes mellitus with hyperglycemia: Secondary | ICD-10-CM

## 2022-06-29 ENCOUNTER — Ambulatory Visit (INDEPENDENT_AMBULATORY_CARE_PROVIDER_SITE_OTHER): Payer: 59 | Admitting: Family Medicine

## 2022-06-29 ENCOUNTER — Encounter: Payer: Self-pay | Admitting: Family Medicine

## 2022-06-29 VITALS — BP 124/82 | HR 75 | Ht 68.0 in | Wt 318.1 lb

## 2022-06-29 DIAGNOSIS — Z23 Encounter for immunization: Secondary | ICD-10-CM | POA: Diagnosis not present

## 2022-06-29 DIAGNOSIS — E559 Vitamin D deficiency, unspecified: Secondary | ICD-10-CM | POA: Diagnosis not present

## 2022-06-29 DIAGNOSIS — J302 Other seasonal allergic rhinitis: Secondary | ICD-10-CM | POA: Insufficient documentation

## 2022-06-29 DIAGNOSIS — I10 Essential (primary) hypertension: Secondary | ICD-10-CM | POA: Diagnosis not present

## 2022-06-29 MED ORDER — AMLODIPINE-OLMESARTAN 10-20 MG PO TABS: 1.0000 | ORAL_TABLET | Freq: Every day | ORAL | 2 refills | Status: DC

## 2022-06-29 MED ORDER — ALBUTEROL SULFATE HFA 108 (90 BASE) MCG/ACT IN AERS: 2.0000 | INHALATION_SPRAY | Freq: Four times a day (QID) | RESPIRATORY_TRACT | 0 refills | Status: DC | PRN

## 2022-06-29 MED ORDER — VITAMIN D (ERGOCALCIFEROL) 1.25 MG (50000 UNIT) PO CAPS: 50000.0000 [IU] | ORAL_CAPSULE | ORAL | 2 refills | Status: DC

## 2022-06-29 NOTE — Assessment & Plan Note (Signed)
Last vitamin D Lab Results  Component Value Date   VD25OH 13.7 (L) 04/29/2022  Refilled weekly vitamin D supplement and encourage patient to continue taking

## 2022-06-29 NOTE — Progress Notes (Signed)
lmes  Established Patient Office Visit  Subjective:  Patient ID: Robert Mcintyre, male    DOB: 13-Jan-1966  Age: 56 y.o. MRN: [id number]  CC:  Chief Complaint  Patient presents with   Follow-up    2 month f/u.     HPI Robert Mcintyre is a 56 y.o. male with past medical history of hypertension, hyperlipidemia and diabetes presents for f/u of  chronic medical conditions.  Hypertension: He takes Norvasc 10 mg daily and olmesartan 20 mg daily.  He reports compliance with the treatment regimen.  He denies headaches, dizziness, blurred vision, chest pain, and palpitations.  Past Medical History:  Diagnosis Date   Arthritis    back (had surgery 98), knees   Asthma    uses albuterol during cold weather   Hypertension    Morbid obesity ([place])    Periumbilical hernia    pt unaware   Prostate cancer ([place])    prostate cancer around 2018, had surgery   Pure hypercholesterolemia 04/24/2020   Spondylolysis, lumbar region     Past Surgical History:  Procedure Laterality Date   COLONOSCOPY N/A 09/16/2016   Procedure: COLONOSCOPY;  Surgeon: Danie Binder, MD;  Location: [facility];  Service: Endoscopy;  Laterality: N/A;  1:00 PM   LYMPHADENECTOMY Bilateral 04/09/2018   Procedure: LYMPHADENECTOMY;  Surgeon: Raynelle Bring, MD;  Location: [facility];  Service: Urology;  Laterality: Bilateral;   POLYPECTOMY  09/16/2016   Procedure: POLYPECTOMY;  Surgeon: Danie Binder, MD;  Location: [facility];  Service: Endoscopy;;  ascending colon polyp, hepatic flexure polyp, descending colon polyp,    ROBOT ASSISTED LAPAROSCOPIC RADICAL PROSTATECTOMY N/A 04/09/2018   Procedure: XI ROBOTIC ASSISTED LAPAROSCOPIC RADICAL PROSTATECTOMY LEVEL 3;  Surgeon: Raynelle Bring, MD;  Location: [facility];  Service: Urology;  Laterality: N/A;  ONLY NEEDS 210 MIN FOR ALL PROCEDURES   SPINE SURGERY  05/1997   back fusion    Family History  Problem Relation Age of Onset   Heart disease Mother    Stroke Mother    Hypertension  Mother    Diabetes Mother    Asthma Father    Hypertension Brother     Social History   Socioeconomic History   Marital status: Single    Spouse name: Not on file   Number of children: 0   Years of education: 12   Highest education level: Not on file  Occupational History   Occupation: truck driver  Tobacco Use   Smoking status: Former    Packs/day: 0.25    Years: 3.00    Total pack years: 0.75    Types: Cigarettes    Quit date: 1980    Years since quitting: 43.7   Smokeless tobacco: Never  Vaping Use   Vaping Use: Never used  Substance and Sexual Activity   Alcohol use: Not Currently    Comment: occasional; beer and some liquor; 6 pack last several weeks and moonshine lasts months   Drug use: No   Sexual activity: Not Currently  Other Topics Concern   Not on file  Social History Narrative   Lives with brother   Social Determinants of Health   Financial Resource Strain: Not on file  Food Insecurity: Not on file  Transportation Needs: Not on file  Physical Activity: Not on file  Stress: Not on file  Social Connections: Not on file  Intimate Partner Violence: Not on file    Outpatient Medications Prior to Visit  Medication Sig Dispense Refill  atorvastatin (LIPITOR) 10 MG tablet Take 1 tablet (10 mg total) by mouth daily. 90 tablet 3   blood glucose meter kit and supplies Per insurance preference. Check blood glucose once a day. Dx E11.65 1 each 11   glimepiride (AMARYL) 2 MG tablet Take 1 tablet by mouth once daily with breakfast 90 tablet 0   ibuprofen (ADVIL) 600 MG tablet Take 1 tablet (600 mg total) by mouth 3 (three) times daily. 30 tablet 0   metFORMIN (GLUCOPHAGE-XR) 500 MG 24 hr tablet Take 1000 mg once daily 360 tablet 1   QUEtiapine (SEROQUEL) 50 MG tablet Take 1 tablet (50 mg total) by mouth at bedtime. 30 tablet 3   amLODipine (NORVASC) 10 MG tablet Take 1 tablet (10 mg total) by mouth daily. 90 tablet 0   Bepotastine Besilate (BEPREVE) 1.5 % SOLN  Place 1 drop into both eyes 2 (two) times daily as needed. 10 mL 5   gabapentin (NEURONTIN) 300 MG capsule Take 1 capsule (300 mg total) by mouth at bedtime. 30 capsule 1   olmesartan (BENICAR) 20 MG tablet Take 1 tablet (20 mg total) by mouth daily. 90 tablet 0   albuterol (PROAIR HFA) 108 (90 Base) MCG/ACT inhaler Inhale 2 puffs into the lungs every 6 (six) hours as needed for wheezing or shortness of breath. (Patient not taking: Reported on 06/29/2022) 18 g 0   Vitamin D, Ergocalciferol, (DRISDOL) 1.25 MG (50000 UNIT) CAPS capsule Take 1 capsule (50,000 Units total) by mouth every 7 (seven) days. (Patient not taking: Reported on 06/29/2022) 8 capsule 0   No facility-administered medications prior to visit.    Allergies  Allergen Reactions   Eggs Or Egg-Derived Products Nausea And Vomiting    ROS Review of Systems  Constitutional:  Negative for fatigue and fever.  Eyes:  Negative for visual disturbance.  Respiratory:  Negative for chest tightness and shortness of breath.   Cardiovascular:  Negative for chest pain and palpitations.  Gastrointestinal:  Negative for diarrhea, nausea and vomiting.  Neurological:  Negative for dizziness and headaches.  Psychiatric/Behavioral:  Negative for self-injury and suicidal ideas.       Objective:    Physical Exam HENT:     Head: Normocephalic.     Right Ear: External ear normal.     Left Ear: External ear normal.     Nose: No congestion.  Cardiovascular:     Rate and Rhythm: Normal rate and regular rhythm.     Pulses: Normal pulses.     Heart sounds: Normal heart sounds.  Pulmonary:     Effort: Pulmonary effort is normal.     Breath sounds: Normal breath sounds.  Neurological:     Mental Status: He is alert.     BP 124/82   Pulse 75   Ht _0  (1.727 m)   Wt (!) 318 lb 1.9 oz (144.3 kg)   SpO2 95%   BMI 48.37 kg/m  Wt Readings from Last 3 Encounters:  06/29/22 (!) 318 lb 1.9 oz (144.3 kg)  04/22/22 (!) 318 lb (144.2 kg)   12/17/21 (!) 310 lb (140.6 kg)    Lab Results  Component Value Date   TSH 1.430 04/29/2022   Lab Results  Component Value Date   WBC 7.4 11/01/2021   HGB 13.7 11/01/2021   HCT 42.0 11/01/2021   MCV 92 11/01/2021   PLT 329 11/01/2021   Lab Results  Component Value Date   NA 140 04/29/2022   K 4.4 04/29/2022  CO2 27 04/29/2022   GLUCOSE 120 (H) 04/29/2022   BUN 11 04/29/2022   CREATININE 1.09 04/29/2022   BILITOT 0.4 04/29/2022   ALKPHOS 90 04/29/2022   AST 8 04/29/2022   ALT 11 04/29/2022   PROT 6.8 04/29/2022   ALBUMIN 4.0 04/29/2022   CALCIUM 9.1 04/29/2022   ANIONGAP 9 04/06/2018   EGFR 79 04/29/2022   Lab Results  Component Value Date   CHOL 120 04/29/2022   Lab Results  Component Value Date   HDL 42 04/29/2022   Lab Results  Component Value Date   LDLCALC 65 04/29/2022   Lab Results  Component Value Date   TRIG 61 04/29/2022   Lab Results  Component Value Date   CHOLHDL 2.9 04/29/2022   Lab Results  Component Value Date   HGBA1C 6.5 (H) 04/29/2022      Assessment & Plan:   Problem List Items Addressed This Visit       Cardiovascular and Mediastinum   Essential hypertension - Primary    Controlled To decrease pill burden, I will start the patient on Azor 10-20 daily He reports that he has 2 weeks left of Norvasc 10 mg and olmesartan 20 mg Encourage patient to pick up his new prescription of Azor 10-20 after completing the previous prescription Patient verbalized understanding      Relevant Medications   amlodipine-olmesartan (AZOR) 10-20 MG tablet     Other   Vitamin D deficiency    Last vitamin D Lab Results  Component Value Date   VD25OH 13.7 (L) 04/29/2022  Refilled weekly vitamin D supplement and encourage patient to continue taking      Relevant Medications   Vitamin D, Ergocalciferol, (DRISDOL) 1.25 MG (50000 UNIT) CAPS capsule   Seasonal allergies    History of childhood asthma Complains of shortness of breath  during the cool weather Denies chest pain, palpitation, and chest tightness Refilled albuterol inhaler to use as needed for shortness of breath      Relevant Medications   albuterol (PROAIR HFA) 108 (90 Base) MCG/ACT inhaler   Other Visit Diagnoses     Flu vaccine need       Relevant Orders   Flu Vaccine QUAD 6+ mos PF IM (Fluarix Quad PF) (Completed)       Meds ordered this encounter  Medications   albuterol (PROAIR HFA) 108 (90 Base) MCG/ACT inhaler    Sig: Inhale 2 puffs into the lungs every 6 (six) hours as needed for wheezing or shortness of breath.    Dispense:  18 g    Refill:  0   Vitamin D, Ergocalciferol, (DRISDOL) 1.25 MG (50000 UNIT) CAPS capsule    Sig: Take 1 capsule (50,000 Units total) by mouth every 7 (seven) days.    Dispense:  8 capsule    Refill:  2   amlodipine-olmesartan (AZOR) 10-20 MG tablet    Sig: Take 1 tablet by mouth daily.    Dispense:  90 tablet    Refill:  2    Follow-up: Return in about 3 months (around 09/29/2022).    Alvira Monday, FNP

## 2022-06-29 NOTE — Assessment & Plan Note (Signed)
History of childhood asthma Complains of shortness of breath during the cool weather Denies chest pain, palpitation, and chest tightness Refilled albuterol inhaler to use as needed for shortness of breath

## 2022-06-29 NOTE — Patient Instructions (Signed)
I appreciate the opportunity to provide care to you today!    Follow up:  3 months  Labs: next visit    Please pick up your medications at the pharmacy    Please continue to a heart-healthy diet and increase your physical activities. Try to exercise for 30mins at least three times a week.      It was a pleasure to see you and I look forward to continuing to work together on your health and well-being. Please do not hesitate to call the office if you need care or have questions about your care.   Have a wonderful day and week. With Gratitude, Paulita Licklider MSN, FNP-BC  

## 2022-06-29 NOTE — Assessment & Plan Note (Signed)
Controlled To decrease pill burden, I will start the patient on Azor 10-20 daily He reports that he has 2 weeks left of Norvasc 10 mg and olmesartan 20 mg Encourage patient to pick up his new prescription of Azor 10-20 after completing the previous prescription Patient verbalized understanding

## 2022-08-09 LAB — HM DIABETES EYE EXAM

## 2022-08-15 NOTE — Progress Notes (Signed)
documentation

## 2022-08-20 ENCOUNTER — Other Ambulatory Visit: Payer: Self-pay | Admitting: Nurse Practitioner

## 2022-08-20 DIAGNOSIS — E1165 Type 2 diabetes mellitus with hyperglycemia: Secondary | ICD-10-CM

## 2022-09-30 ENCOUNTER — Ambulatory Visit: Payer: 59 | Admitting: Family Medicine

## 2022-10-12 ENCOUNTER — Encounter: Payer: Self-pay | Admitting: Family Medicine

## 2022-10-12 ENCOUNTER — Ambulatory Visit (INDEPENDENT_AMBULATORY_CARE_PROVIDER_SITE_OTHER): Payer: Managed Care, Other (non HMO) | Admitting: Family Medicine

## 2022-10-12 VITALS — BP 140/78 | HR 93 | Ht 68.0 in | Wt 324.1 lb

## 2022-10-12 DIAGNOSIS — E038 Other specified hypothyroidism: Secondary | ICD-10-CM

## 2022-10-12 DIAGNOSIS — E559 Vitamin D deficiency, unspecified: Secondary | ICD-10-CM

## 2022-10-12 DIAGNOSIS — R7301 Impaired fasting glucose: Secondary | ICD-10-CM

## 2022-10-12 DIAGNOSIS — I10 Essential (primary) hypertension: Secondary | ICD-10-CM

## 2022-10-12 DIAGNOSIS — R079 Chest pain, unspecified: Secondary | ICD-10-CM | POA: Diagnosis not present

## 2022-10-12 DIAGNOSIS — E7849 Other hyperlipidemia: Secondary | ICD-10-CM

## 2022-10-12 DIAGNOSIS — M79602 Pain in left arm: Secondary | ICD-10-CM | POA: Insufficient documentation

## 2022-10-12 DIAGNOSIS — E1165 Type 2 diabetes mellitus with hyperglycemia: Secondary | ICD-10-CM

## 2022-10-12 NOTE — Assessment & Plan Note (Signed)
He takes atorvastatin 10 mg daily He denies muscle aches and pain Will assess lipid levels today Lab Results  Component Value Date   CHOL 120 04/29/2022   HDL 42 04/29/2022   LDLCALC 65 04/29/2022   TRIG 61 04/29/2022   CHOLHDL 2.9 04/29/2022

## 2022-10-12 NOTE — Patient Instructions (Signed)
I appreciate the opportunity to provide care to you today!    Follow up:  2 weeks for BP  Fasting Labs: please stop by the lab today/ during the week to get your blood drawn (CBC, CMP, TSH, Lipid profile, HgA1c, Vit D)  Please continue taking ibuprofen as needed for left shoulder pain    Please continue to a heart-healthy diet and increase your physical activities. Try to exercise for 65mns at least five times a week.      It was a pleasure to see you and I look forward to continuing to work together on your health and well-being. Please do not hesitate to call the office if you need care or have questions about your care.   Have a wonderful day and week. With Gratitude, GAlvira MondayMSN, FNP-BC

## 2022-10-12 NOTE — Assessment & Plan Note (Addendum)
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

## 2022-10-12 NOTE — Assessment & Plan Note (Signed)
He takes metformin 1000 mg daily, and glimepiride 2 mg daily He denies polyuria, polyphagia, polydipsia He reports compliance with treatment regimen Will assess globin A1c today Lab Results  Component Value Date   HGBA1C 6.5 (H) 04/29/2022

## 2022-10-12 NOTE — Assessment & Plan Note (Addendum)
Uncontrolled He reports increased life stresses, noting compliance with his antihypertensive He takes amlodipine-olmesartan 10-20 daily Denies headaches, dizziness, and blurred vision Encouraged low-sodium diet with increased physical activities DASH diet reviewed Will assess CBC and CMP today We will follow-up on BP in 2 weeks BP Readings from Last 3 Encounters:  10/12/22 (!) 140/78  06/29/22 124/82  04/22/22 140/76

## 2022-10-12 NOTE — Progress Notes (Signed)
Established Patient Office Visit  Subjective:  Patient ID: Robert Mcintyre, male    DOB: 1966-04-12  Age: 57 y.o. MRN: [id number]  CC:  Chief Complaint  Patient presents with   Follow-up    Pt reports chest pain on his left side since 10/06/2022, pain is getting better but when he moves a certain way he has the pain there.     HPI Robert Mcintyre is a 57 y.o. male with past medical history of hypertension, hyperlipidemia, and  diabetes presents for f/u of  chronic medical conditions. For the details of today's visit, please refer to the assessment and plan.     Past Medical History:  Diagnosis Date   Arthritis    back (had surgery 98), knees   Asthma    uses albuterol during cold weather   Hypertension    Morbid obesity ([place])    Periumbilical hernia    pt unaware   Prostate cancer ([place])    prostate cancer around 2018, had surgery   Pure hypercholesterolemia 04/24/2020   Spondylolysis, lumbar region     Past Surgical History:  Procedure Laterality Date   COLONOSCOPY N/A 09/16/2016   Procedure: COLONOSCOPY;  Surgeon: Danie Binder, MD;  Location: [facility];  Service: Endoscopy;  Laterality: N/A;  1:00 PM   LYMPHADENECTOMY Bilateral 04/09/2018   Procedure: LYMPHADENECTOMY;  Surgeon: Raynelle Bring, MD;  Location: [facility];  Service: Urology;  Laterality: Bilateral;   POLYPECTOMY  09/16/2016   Procedure: POLYPECTOMY;  Surgeon: Danie Binder, MD;  Location: [facility];  Service: Endoscopy;;  ascending colon polyp, hepatic flexure polyp, descending colon polyp,    ROBOT ASSISTED LAPAROSCOPIC RADICAL PROSTATECTOMY N/A 04/09/2018   Procedure: XI ROBOTIC ASSISTED LAPAROSCOPIC RADICAL PROSTATECTOMY LEVEL 3;  Surgeon: Raynelle Bring, MD;  Location: [facility];  Service: Urology;  Laterality: N/A;  ONLY NEEDS 210 MIN FOR ALL PROCEDURES   SPINE SURGERY  05/1997   back fusion    Family History  Problem Relation Age of Onset   Heart disease Mother    Stroke Mother    Hypertension  Mother    Diabetes Mother    Asthma Father    Hypertension Brother     Social History   Socioeconomic History   Marital status: Single    Spouse name: Not on file   Number of children: 0   Years of education: 12   Highest education level: Not on file  Occupational History   Occupation: truck driver  Tobacco Use   Smoking status: Former    Packs/day: 0.25    Years: 3.00    Total pack years: 0.75    Types: Cigarettes    Quit date: 1980    Years since quitting: 44.0   Smokeless tobacco: Never  Vaping Use   Vaping Use: Never used  Substance and Sexual Activity   Alcohol use: Not Currently    Comment: occasional; beer and some liquor; 6 pack last several weeks and moonshine lasts months   Drug use: No   Sexual activity: Not Currently  Other Topics Concern   Not on file  Social History Narrative   Lives with brother   Social Determinants of Health   Financial Resource Strain: Not on file  Food Insecurity: Not on file  Transportation Needs: Not on file  Physical Activity: Not on file  Stress: Not on file  Social Connections: Not on file  Intimate Partner Violence: Not on file    Outpatient Medications Prior  to Visit  Medication Sig Dispense Refill   albuterol (PROAIR HFA) 108 (90 Base) MCG/ACT inhaler Inhale 2 puffs into the lungs every 6 (six) hours as needed for wheezing or shortness of breath. 18 g 0   amlodipine-olmesartan (AZOR) 10-20 MG tablet Take 1 tablet by mouth daily. 90 tablet 2   atorvastatin (LIPITOR) 10 MG tablet Take 1 tablet (10 mg total) by mouth daily. 90 tablet 3   blood glucose meter kit and supplies Per insurance preference. Check blood glucose once a day. Dx E11.65 1 each 11   glimepiride (AMARYL) 2 MG tablet Take 1 tablet by mouth once daily with breakfast 90 tablet 0   ibuprofen (ADVIL) 600 MG tablet Take 1 tablet (600 mg total) by mouth 3 (three) times daily. 30 tablet 0   metFORMIN (GLUCOPHAGE-XR) 500 MG 24 hr tablet Take 1000 mg once daily  360 tablet 1   QUEtiapine (SEROQUEL) 50 MG tablet Take 1 tablet (50 mg total) by mouth at bedtime. 30 tablet 3   Vitamin D, Ergocalciferol, (DRISDOL) 1.25 MG (50000 UNIT) CAPS capsule Take 1 capsule (50,000 Units total) by mouth every 7 (seven) days. 8 capsule 2   No facility-administered medications prior to visit.    Allergies  Allergen Reactions   Eggs Or Egg-Derived Products Nausea And Vomiting    ROS Review of Systems  Constitutional:  Negative for chills, diaphoresis, fatigue and fever.  Eyes:  Negative for visual disturbance.  Respiratory:  Negative for chest tightness and shortness of breath.   Cardiovascular:  Positive for chest pain. Negative for palpitations.  Neurological:  Negative for dizziness and headaches.      Objective:    Physical Exam HENT:     Head: Normocephalic.     Right Ear: External ear normal.     Left Ear: External ear normal.     Nose: No congestion or rhinorrhea.     Mouth/Throat:     Mouth: Mucous membranes are moist.  Cardiovascular:     Rate and Rhythm: Regular rhythm.     Heart sounds: No murmur heard.    No friction rub.  Pulmonary:     Effort: No respiratory distress.     Breath sounds: Normal breath sounds. No stridor. No wheezing, rhonchi or rales.  Musculoskeletal:     Comments: Reports mild pain of the shoulder with abduction and external rotation  Neurological:     Mental Status: He is alert.     BP (!) 140/78 (BP Location: Left Arm)   Pulse 93   Ht '5\' 8"'$  (1.727 m)   Wt (!) 324 lb 1.9 oz (147 kg)   SpO2 92%   BMI 49.28 kg/m  Wt Readings from Last 3 Encounters:  10/12/22 (!) 324 lb 1.9 oz (147 kg)  06/29/22 (!) 318 lb 1.9 oz (144.3 kg)  04/22/22 (!) 318 lb (144.2 kg)    Lab Results  Component Value Date   TSH 1.430 04/29/2022   Lab Results  Component Value Date   WBC 7.4 11/01/2021   HGB 13.7 11/01/2021   HCT 42.0 11/01/2021   MCV 92 11/01/2021   PLT 329 11/01/2021   Lab Results  Component Value Date    NA 140 04/29/2022   K 4.4 04/29/2022   CO2 27 04/29/2022   GLUCOSE 120 (H) 04/29/2022   BUN 11 04/29/2022   CREATININE 1.09 04/29/2022   BILITOT 0.4 04/29/2022   ALKPHOS 90 04/29/2022   AST 8 04/29/2022   ALT 11 04/29/2022  PROT 6.8 04/29/2022   ALBUMIN 4.0 04/29/2022   CALCIUM 9.1 04/29/2022   ANIONGAP 9 04/06/2018   EGFR 79 04/29/2022   Lab Results  Component Value Date   CHOL 120 04/29/2022   Lab Results  Component Value Date   HDL 42 04/29/2022   Lab Results  Component Value Date   LDLCALC 65 04/29/2022   Lab Results  Component Value Date   TRIG 61 04/29/2022   Lab Results  Component Value Date   CHOLHDL 2.9 04/29/2022   Lab Results  Component Value Date   HGBA1C 6.5 (H) 04/29/2022      Assessment & Plan:  Essential hypertension Assessment & Plan: Uncontrolled He reports increased life stresses, noting compliance with his antihypertensive He takes amlodipine-olmesartan 10-20 daily Denies headaches, dizziness, and blurred vision Encouraged low-sodium diet with increased physical activities DASH diet reviewed Will assess CBC and CMP today We will follow-up on BP in 2 weeks BP Readings from Last 3 Encounters:  10/12/22 (!) 140/78  06/29/22 124/82  04/22/22 140/76      Musculoskeletal pain of upper extremity, left Assessment & Plan: He reports pain in the left upper chest EKG in the office shows normal sinus rhythm Patient denies shortness of breath, chest tightness, and palpitation No recent injury or trauma reported 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 He reports awakening to pain in his left shoulder, radiating to his back Pain is only elicited with certain movements of the shoulder No pain reported at rest Pain is rated 3 out of 10 today, with improvement since onset Onset of symptoms since 10/06/2022,  Symptoms likely of muscle strain, musculoskeletal in origin and unlikely of cardiac Encouraged to continue taking Motrin 600 mg  as  needed   Chest pain at rest -     EKG 12-Lead  Other hyperlipidemia Assessment & Plan: He takes atorvastatin 10 mg daily He denies muscle aches and pain Will assess lipid levels today Lab Results  Component Value Date   CHOL 120 04/29/2022   HDL 42 04/29/2022   LDLCALC 65 04/29/2022   TRIG 61 04/29/2022   CHOLHDL 2.9 04/29/2022     Orders: -     Lipid panel -     CMP14+EGFR -     CBC with Differential/Platelet  Type 2 diabetes mellitus with hyperglycemia, without long-term current use of insulin ([place]) Assessment & Plan: He takes metformin 1000 mg daily, and glimepiride 2 mg daily He denies polyuria, polyphagia, polydipsia He reports compliance with treatment regimen Will assess globin A1c today Lab Results  Component Value Date   HGBA1C 6.5 (H) 04/29/2022      IFG (impaired fasting glucose) -     Hemoglobin A1c  Vitamin D deficiency -     VITAMIN D 25 Hydroxy (Vit-D Deficiency, Fractures)  Other specified hypothyroidism -     TSH + free T4    Follow-up: Return in about 2 weeks (around 10/26/2022) for BP.   Alvira Monday, FNP

## 2022-10-31 ENCOUNTER — Encounter: Payer: Self-pay | Admitting: Internal Medicine

## 2022-10-31 ENCOUNTER — Ambulatory Visit (INDEPENDENT_AMBULATORY_CARE_PROVIDER_SITE_OTHER): Payer: Managed Care, Other (non HMO) | Admitting: Internal Medicine

## 2022-10-31 VITALS — BP 135/72 | HR 94 | Ht 68.0 in | Wt 322.1 lb

## 2022-10-31 DIAGNOSIS — I10 Essential (primary) hypertension: Secondary | ICD-10-CM

## 2022-10-31 NOTE — Assessment & Plan Note (Addendum)
Patient's BP today is 135/72 with a goal of <130/80. The patient endorses adherence to amlodipine- olmesartan 10- 20 mg. He denied lightheadedness, weakness, dizziness on standing, swelling in the feet or ankles.   Plan: BP almost at goal on office reading. Improved since starting antihypertensive.  Continue amlodipine - olmesartan 10-20 mg.  Please check you blood pressure at home. Keep one week of reading before your next office visit.  Patient will get labs drawn today ordered by PCP

## 2022-10-31 NOTE — Progress Notes (Signed)
   HPI:[name] is a 57 y.o. male who presents for evaluation of Hypertension (Follow up) . For the details of today's visit, please refer to the assessment and plan.  Physical Exam: Vitals:   10/31/22 0813  BP: 135/72  Pulse: 94  SpO2: 92%  Weight: (!) 322 lb 1.9 oz (146.1 kg)  Height: '5\' 8"'$  (1.727 m)     Physical Exam Constitutional:      Appearance: He is well-developed and well-groomed.  Eyes:     General: No scleral icterus.    Conjunctiva/sclera: Conjunctivae normal.  Cardiovascular:     Rate and Rhythm: Normal rate and regular rhythm.     Heart sounds: No murmur heard.    No friction rub. No gallop.  Pulmonary:     Effort: Pulmonary effort is normal.     Breath sounds: No wheezing, rhonchi or rales.  Musculoskeletal:     Right lower leg: No edema.     Left lower leg: No edema.  Skin:    General: Skin is warm and dry.      Assessment & Plan:   Essential hypertension Patient's BP today is 135/72 with a goal of <130/80. The patient endorses adherence to amlodipine- olmesartan 10- 20 mg. He denied lightheadedness, weakness, dizziness on standing, swelling in the feet or ankles.   Plan: BP almost at goal on office reading. Improved since starting antihypertensive.  Continue amlodipine - olmesartan 10-20 mg.  Please check you blood pressure at home. Keep one week of reading before your next office visit.  Patient will get labs drawn today ordered by PCP    Lorene Dy, MD

## 2022-10-31 NOTE — Patient Instructions (Addendum)
Thank you for trusting me with your care. To recap, today we discussed the following:  Continue amlodipine - olmesartan 10-20 mg.  Please check you blood pressure at home. Keep one week of reading before your next office visit.  Follow up in 3 months

## 2022-11-01 ENCOUNTER — Other Ambulatory Visit: Payer: Self-pay | Admitting: Family Medicine

## 2022-11-01 LAB — CMP14+EGFR
ALT: 11 IU/L (ref 0–44)
AST: 12 IU/L (ref 0–40)
Albumin/Globulin Ratio: 1.3 (ref 1.2–2.2)
Albumin: 3.9 g/dL (ref 3.8–4.9)
Alkaline Phosphatase: 102 IU/L (ref 44–121)
BUN/Creatinine Ratio: 13 (ref 9–20)
BUN: 16 mg/dL (ref 6–24)
Bilirubin Total: 0.5 mg/dL (ref 0.0–1.2)
CO2: 24 mmol/L (ref 20–29)
Calcium: 9.5 mg/dL (ref 8.7–10.2)
Chloride: 101 mmol/L (ref 96–106)
Creatinine, Ser: 1.26 mg/dL (ref 0.76–1.27)
Globulin, Total: 3 g/dL (ref 1.5–4.5)
Glucose: 116 mg/dL — ABNORMAL HIGH (ref 70–99)
Potassium: 4.7 mmol/L (ref 3.5–5.2)
Sodium: 142 mmol/L (ref 134–144)
Total Protein: 6.9 g/dL (ref 6.0–8.5)
eGFR: 66 mL/min/{1.73_m2} (ref >59)

## 2022-11-01 LAB — HEMOGLOBIN A1C
Est. average glucose Bld gHb Est-mCnc: 160 mg/dL
Hgb A1c MFr Bld: 7.2 % — ABNORMAL HIGH (ref 4.8–5.6)

## 2022-11-01 LAB — TSH+FREE T4
Free T4: 1.12 ng/dL (ref 0.82–1.77)
TSH: 1.5 u[IU]/mL (ref 0.450–4.500)

## 2022-11-01 LAB — LIPID PANEL
Chol/HDL Ratio: 3.1 ratio (ref 0.0–5.0)
Cholesterol, Total: 120 mg/dL (ref 100–199)
HDL: 39 mg/dL — ABNORMAL LOW (ref >39)
LDL Chol Calc (NIH): 66 mg/dL (ref 0–99)
Triglycerides: 70 mg/dL (ref 0–149)
VLDL Cholesterol Cal: 15 mg/dL (ref 5–40)

## 2022-11-01 LAB — CBC WITH DIFFERENTIAL/PLATELET
Basophils Absolute: 0 10*3/uL (ref 0.0–0.2)
Basos: 1 %
EOS (ABSOLUTE): 0.2 10*3/uL (ref 0.0–0.4)
Eos: 4 %
Hematocrit: 43.3 % (ref 37.5–51.0)
Hemoglobin: 13.6 g/dL (ref 13.0–17.7)
Immature Grans (Abs): 0 10*3/uL (ref 0.0–0.1)
Immature Granulocytes: 1 %
Lymphocytes Absolute: 1 10*3/uL (ref 0.7–3.1)
Lymphs: 18 %
MCH: 28.9 pg (ref 26.6–33.0)
MCHC: 31.4 g/dL — ABNORMAL LOW (ref 31.5–35.7)
MCV: 92 fL (ref 79–97)
Monocytes Absolute: 0.6 10*3/uL (ref 0.1–0.9)
Monocytes: 11 %
Neutrophils Absolute: 3.8 10*3/uL (ref 1.4–7.0)
Neutrophils: 65 %
Platelets: 306 10*3/uL (ref 150–450)
RBC: 4.7 x10E6/uL (ref 4.14–5.80)
RDW: 12.6 % (ref 11.6–15.4)
WBC: 5.7 10*3/uL (ref 3.4–10.8)

## 2022-11-01 LAB — VITAMIN D 25 HYDROXY (VIT D DEFICIENCY, FRACTURES): Vit D, 25-Hydroxy: 56.3 ng/mL (ref 30.0–100.0)

## 2022-11-01 MED ORDER — METFORMIN HCL 1000 MG PO TABS: 1000.0000 mg | ORAL_TABLET | Freq: Two times a day (BID) | ORAL | 3 refills | Status: DC

## 2022-11-03 ENCOUNTER — Telehealth: Payer: Self-pay | Admitting: Family Medicine

## 2022-11-03 NOTE — Telephone Encounter (Signed)
error 

## 2022-11-04 ENCOUNTER — Encounter: Payer: Self-pay | Admitting: Family Medicine

## 2022-11-04 ENCOUNTER — Ambulatory Visit (INDEPENDENT_AMBULATORY_CARE_PROVIDER_SITE_OTHER): Payer: Managed Care, Other (non HMO) | Admitting: Family Medicine

## 2022-11-04 VITALS — BP 146/80 | HR 96 | Ht 68.0 in | Wt 323.0 lb

## 2022-11-04 DIAGNOSIS — Z0001 Encounter for general adult medical examination with abnormal findings: Secondary | ICD-10-CM

## 2022-11-04 NOTE — Patient Instructions (Signed)
It was Pleasure meeting with you today. Please take your medications as prescribed.

## 2022-11-04 NOTE — Progress Notes (Signed)
Complete physical exam  Patient: Robert Mcintyre   DOB: 11/15/1965   56 y.o. Male  MRN: [id number]  Subjective:    No chief complaint on file.   Robert Mcintyre is a 57 y.o. male who presents today for a complete physical exam. He reports consuming a general diet. The patient does not participate in regular exercise at present. He generally feels well. He reports sleeping well. He does not have additional problems to discuss today.    Most recent fall risk assessment:    11/04/2022    9:43 AM  Fall Risk   Falls in the past year? 0  Number falls in past yr: 0  Injury with Fall? 0  Risk for fall due to : No Fall Risks  Follow up Falls evaluation completed     Most recent depression screenings:    11/04/2022    9:43 AM 10/31/2022    8:17 AM  PHQ 2/9 Scores  PHQ - 2 Score 0 0  PHQ- 9 Score 2 0    Vision:Within last year  Patient Care Team: Alvira Monday, FNP as PCP - General (Family Medicine) Raynelle Bring, MD as Consulting Physician (Urology) Danie Binder, MD (Inactive) as Consulting Physician (Gastroenterology) Katheren Puller, RN as Oncology Nurse Navigator   Outpatient Medications Prior to Visit  Medication Sig   albuterol (PROAIR HFA) 108 (90 Base) MCG/ACT inhaler Inhale 2 puffs into the lungs every 6 (six) hours as needed for wheezing or shortness of breath.   amlodipine-olmesartan (AZOR) 10-20 MG tablet Take 1 tablet by mouth daily.   atorvastatin (LIPITOR) 10 MG tablet Take 1 tablet (10 mg total) by mouth daily.   blood glucose meter kit and supplies Per insurance preference. Check blood glucose once a day. Dx E11.65   glimepiride (AMARYL) 2 MG tablet Take 1 tablet by mouth once daily with breakfast   ibuprofen (ADVIL) 600 MG tablet Take 1 tablet (600 mg total) by mouth 3 (three) times daily.   metFORMIN (GLUCOPHAGE) 1000 MG tablet Take 1 tablet (1,000 mg total) by mouth 2 (two) times daily with a meal.   olmesartan (BENICAR) 20 MG tablet Take 1 tablet by mouth daily.    QUEtiapine (SEROQUEL) 50 MG tablet Take 1 tablet (50 mg total) by mouth at bedtime.   rosuvastatin (CRESTOR) 5 MG tablet Take 1 tablet by mouth at bedtime.   Vitamin D, Ergocalciferol, (DRISDOL) 1.25 MG (50000 UNIT) CAPS capsule Take 1 capsule (50,000 Units total) by mouth every 7 (seven) days.   No facility-administered medications prior to visit.    Review of Systems  Constitutional:  Negative for chills and fever.  HENT:  Negative for ear pain and tinnitus.   Eyes:  Negative for blurred vision.  Respiratory:  Negative for shortness of breath.   Cardiovascular:  Negative for chest pain.  Gastrointestinal:  Negative for abdominal pain, nausea and vomiting.  Genitourinary:  Negative for dysuria.  Musculoskeletal:  Negative for myalgias.  Skin:  Negative for rash.  Neurological:  Negative for dizziness and headaches.  Psychiatric/Behavioral:  Negative for depression. The patient is not nervous/anxious.        Objective:    BP (!) 146/80   Pulse 96   Ht 5' 8"$  (1.727 m)   Wt (!) 323 lb (146.5 kg)   SpO2 96%   BMI 49.11 kg/m  BP Readings from Last 3 Encounters:  11/04/22 (!) 146/80  10/31/22 135/72  10/12/22 (!) 140/78      Physical  Exam Constitutional:      Appearance: He is obese.  HENT:     Head: Normocephalic.     Right Ear: Tympanic membrane normal.     Left Ear: Tympanic membrane normal.     Nose: Nose normal. No congestion or rhinorrhea.     Mouth/Throat:     Mouth: Mucous membranes are moist.  Eyes:     Extraocular Movements: Extraocular movements intact.     Pupils: Pupils are equal, round, and reactive to light.  Cardiovascular:     Rate and Rhythm: Normal rate and regular rhythm.     Pulses: Normal pulses.     Heart sounds: Normal heart sounds.  Pulmonary:     Effort: Pulmonary effort is normal. No respiratory distress.     Breath sounds: Normal breath sounds.  Abdominal:     General: Bowel sounds are normal. There is no distension.      Palpations: Abdomen is soft. There is no mass.  Musculoskeletal:        General: No swelling. Normal range of motion.     Cervical back: Normal range of motion. No rigidity.     Right lower leg: No edema.     Left lower leg: No edema.  Skin:    General: Skin is warm and dry.     Capillary Refill: Capillary refill takes less than 2 seconds.  Neurological:     General: No focal deficit present.     Mental Status: He is alert.     Motor: No weakness.     Coordination: Coordination normal.     Gait: Gait normal.  Psychiatric:        Mood and Affect: Mood normal.        Thought Content: Thought content normal.      No results found for any visits on 11/04/22.    Assessment & Plan:    Routine Health Maintenance and Physical Exam  Immunization History  Administered Date(s) Administered   Influenza,inj,Quad PF,6+ Mos 07/18/2016, 07/28/2017, 06/13/2019, 11/04/2021, 06/29/2022   Moderna Sars-Covid-2 Vaccination 03/07/2020, 04/04/2020   Pneumococcal Polysaccharide-23 04/24/2020   Tdap 07/18/2016   Zoster Recombinat (Shingrix) 11/04/2021, 04/22/2022    Health Maintenance  Topic Date Due   COVID-19 Vaccine (3 - Moderna risk series) 05/02/2020   FOOT EXAM  12/18/2022   Diabetic kidney evaluation - Urine ACR  04/30/2023   HEMOGLOBIN A1C  05/01/2023   OPHTHALMOLOGY EXAM  08/10/2023   Diabetic kidney evaluation - eGFR measurement  11/01/2023   DTaP/Tdap/Td (2 - Td or Tdap) 07/18/2026   COLONOSCOPY (Pts 45-84yr Insurance coverage will need to be confirmed)  09/16/2026   INFLUENZA VACCINE  Completed   Zoster Vaccines- Shingrix  Completed   HPV VACCINES  Aged Out   Hepatitis C Screening  Discontinued   HIV Screening  Discontinued    Discussed health benefits of physical activity, and encouraged him to engage in regular exercise appropriate for his age and condition.  Encounter for routine adult physical exam with abnormal findings Assessment & Plan: Physical exam done, B/p  146/80 patient stated will take blood pressure medications and his b/p was normal throughout the week Updated screening and health maintenance  Exercise and nutrition counseling BMI 49.11 Encouraged to start lifestyle modifications follow diet low in saturated fat, reduce dietary salt intake, avoid fatty foods, maintain an exercise routine 3 to 5 days a week for a minimum total of 150 minutes.  Explained to  follow a DASH diet which includes vegetables,fruits,whole  grains, fat free or low fat diary,fish,poultry,beans,nuts and seeds,vegetable oils.      No follow-ups on file.     Renard Hamper Ria Comment, FNP

## 2022-11-04 NOTE — Assessment & Plan Note (Addendum)
Physical exam done, B/p 146/80 patient stated will take blood pressure medications and his b/p was normal throughout the week Updated screening and health maintenance  Exercise and nutrition counseling BMI 49.11 Encouraged to start lifestyle modifications follow diet low in saturated fat, reduce dietary salt intake, avoid fatty foods, maintain an exercise routine 3 to 5 days a week for a minimum total of 150 minutes.  Explained to  follow a DASH diet which includes vegetables,fruits,whole grains, fat free or low fat diary,fish,poultry,beans,nuts and seeds,vegetable oils.

## 2022-11-17 ENCOUNTER — Other Ambulatory Visit: Payer: Self-pay | Admitting: Family Medicine

## 2022-11-17 DIAGNOSIS — E1165 Type 2 diabetes mellitus with hyperglycemia: Secondary | ICD-10-CM

## 2022-11-23 ENCOUNTER — Other Ambulatory Visit: Payer: Self-pay | Admitting: Nurse Practitioner

## 2022-11-23 DIAGNOSIS — E7800 Pure hypercholesterolemia, unspecified: Secondary | ICD-10-CM

## 2022-11-24 ENCOUNTER — Other Ambulatory Visit: Payer: Self-pay | Admitting: Family Medicine

## 2022-11-24 DIAGNOSIS — E7800 Pure hypercholesterolemia, unspecified: Secondary | ICD-10-CM

## 2022-11-24 MED ORDER — ATORVASTATIN CALCIUM 10 MG PO TABS: 10.0000 mg | ORAL_TABLET | Freq: Every day | ORAL | 3 refills | Status: DC

## 2022-11-24 NOTE — Telephone Encounter (Signed)
Rx sent 

## 2022-11-24 NOTE — Telephone Encounter (Signed)
Good morning. I hope this finds you well. This patient rx came to the wrong practice. Please advise patient to confirm their pcp with their insurance company. Have a great day and remember we are all in this together.  Warmest regards ,  Elyse Jarvis RMA

## 2022-12-21 ENCOUNTER — Other Ambulatory Visit: Payer: Self-pay | Admitting: Nurse Practitioner

## 2022-12-21 DIAGNOSIS — G4700 Insomnia, unspecified: Secondary | ICD-10-CM

## 2023-01-30 ENCOUNTER — Ambulatory Visit: Payer: Managed Care, Other (non HMO) | Admitting: Family Medicine

## 2023-02-07 ENCOUNTER — Ambulatory Visit (INDEPENDENT_AMBULATORY_CARE_PROVIDER_SITE_OTHER): Payer: Managed Care, Other (non HMO) | Admitting: Family Medicine

## 2023-02-07 ENCOUNTER — Encounter: Payer: Self-pay | Admitting: Family Medicine

## 2023-02-07 VITALS — BP 138/84 | HR 85 | Ht 68.0 in | Wt 317.0 lb

## 2023-02-07 DIAGNOSIS — I10 Essential (primary) hypertension: Secondary | ICD-10-CM

## 2023-02-07 DIAGNOSIS — M25512 Pain in left shoulder: Secondary | ICD-10-CM

## 2023-02-07 DIAGNOSIS — E7849 Other hyperlipidemia: Secondary | ICD-10-CM

## 2023-02-07 DIAGNOSIS — R7301 Impaired fasting glucose: Secondary | ICD-10-CM

## 2023-02-07 DIAGNOSIS — Z7984 Long term (current) use of oral hypoglycemic drugs: Secondary | ICD-10-CM

## 2023-02-07 DIAGNOSIS — E1165 Type 2 diabetes mellitus with hyperglycemia: Secondary | ICD-10-CM

## 2023-02-07 DIAGNOSIS — G8929 Other chronic pain: Secondary | ICD-10-CM | POA: Insufficient documentation

## 2023-02-07 DIAGNOSIS — E559 Vitamin D deficiency, unspecified: Secondary | ICD-10-CM

## 2023-02-07 DIAGNOSIS — E038 Other specified hypothyroidism: Secondary | ICD-10-CM

## 2023-02-07 MED ORDER — NAPROXEN 500 MG PO TABS: 500.0000 mg | ORAL_TABLET | Freq: Two times a day (BID) | ORAL | 0 refills | Status: AC

## 2023-02-07 NOTE — Assessment & Plan Note (Addendum)
Controlled He takes amlodipine-olmesartan 10-20 daily Denies headaches, dizziness, and blurred vision Encouraged low-sodium diet with increased physical activities DASH diet reviewed Will assess CBC and CMP today  BP Readings from Last 3 Encounters:  02/07/23 138/84  11/04/22 (!) 146/80  10/31/22 135/72

## 2023-02-07 NOTE — Assessment & Plan Note (Signed)
No recent trauma or injury reported Reports sleeping on the left side Reports an achy discomfort in the left shoulder Pain in the clinic is 4 out of 10 Pain waxes and wanes Nonradiating Does not affect ADL Pain is not worse at nighttime or lying affected side Pain is worse after prolonged immobility and is relieved with increased use of the affected shoulder No fever or chills reported No tenderness reported with shoulder flexion, extension, abduction, and adduction Symptoms likely of tendinitis of the left shoulder Will treat conservatively with naproxen 500 mg twice daily and rest Will follow-up in 2 weeks

## 2023-02-07 NOTE — Assessment & Plan Note (Signed)
He takes metformin 1000 mg daily, and glimepiride 2 mg daily He denies polyuria, polyphagia, polydipsia He reports compliance with treatment regimen Will assess globin A1c today Lab Results  Component Value Date   HGBA1C 7.2 (H) 10/31/2022

## 2023-02-07 NOTE — Patient Instructions (Addendum)
I appreciate the opportunity to provide care to you today!    Follow up:  2 weeks for left shoulder pain  Labs: please stop by the lab today to get your blood drawn (CBC, CMP, TSH, Lipid profile, HgA1c, Vit D)  Acute Left Shoulder Pain -Rest - This means avoiding activities that aggravate symptoms, including all overhead activities. -Please start taking naproxen 500 mg twice daily for 14 days     Please continue to a heart-healthy diet and increase your physical activities. Try to exercise for at least five days a week.      It was a pleasure to see you and I look forward to continuing to work together on your health and well-being. Please do not hesitate to call the office if you need care or have questions about your care.   Have a wonderful day and week. With Gratitude, Gilmore Laroche MSN, FNP-BC

## 2023-02-07 NOTE — Progress Notes (Signed)
Established Patient Office Visit  Subjective:  Patient ID: Robert Mcintyre, male    DOB: [birth year]  Age: 57 y.o. MRN: [id number]  CC:  Chief Complaint  Patient presents with   Chronic Care Management    3 month f/u   Shoulder Pain    Pt reports left shoulder pain since 2 weeks ago, 02/23/2023. Hurts worse with movement or if he lifts up arm.     HPI Robert Mcintyre is a 57 y.o. male with past medical history of hypertension, hyperlipidemia, and type 2 diabetes presents for f/u of  chronic medical conditions. For the details of today's visit, please refer to the assessment and plan.     Past Medical History:  Diagnosis Date   Arthritis    back (had surgery 98), knees   Asthma    uses albuterol during cold weather   Hypertension    Morbid obesity (HCC)    Periumbilical hernia    pt unaware   Prostate cancer (HCC)    prostate cancer around 2018, had surgery   Pure hypercholesterolemia 04/24/2020   Spondylolysis, lumbar region     Past Surgical History:  Procedure Laterality Date   COLONOSCOPY N/A 09/16/2016   Procedure: COLONOSCOPY;  Surgeon: West Bali, MD;  Location: [facility];  Service: Endoscopy;  Laterality: N/A;  1:00 PM   LYMPHADENECTOMY Bilateral 04/09/2018   Procedure: LYMPHADENECTOMY;  Surgeon: Heloise Purpura, MD;  Location: [facility];  Service: Urology;  Laterality: Bilateral;   POLYPECTOMY  09/16/2016   Procedure: POLYPECTOMY;  Surgeon: West Bali, MD;  Location: [facility];  Service: Endoscopy;;  ascending colon polyp, hepatic flexure polyp, descending colon polyp,    ROBOT ASSISTED LAPAROSCOPIC RADICAL PROSTATECTOMY N/A 04/09/2018   Procedure: XI ROBOTIC ASSISTED LAPAROSCOPIC RADICAL PROSTATECTOMY LEVEL 3;  Surgeon: Heloise Purpura, MD;  Location: [facility];  Service: Urology;  Laterality: N/A;  ONLY NEEDS 210 MIN FOR ALL PROCEDURES   SPINE SURGERY  05/1997   back fusion    Family History  Problem Relation Age of Onset   Heart disease Mother    Stroke  Mother    Hypertension Mother    Diabetes Mother    Asthma Father    Hypertension Brother     Social History   Socioeconomic History   Marital status: Single    Spouse name: Not on file   Number of children: 0   Years of education: 12   Highest education level: Not on file  Occupational History   Occupation: truck driver  Tobacco Use   Smoking status: Former    Packs/day: 0.25    Years: 3.00    Additional pack years: 0.00    Total pack years: 0.75    Types: Cigarettes    Quit date: 1980    Years since quitting: 44.3   Smokeless tobacco: Never  Vaping Use   Vaping Use: Never used  Substance and Sexual Activity   Alcohol use: Not Currently    Comment: occasional; beer and some liquor; 6 pack last several weeks and moonshine lasts months   Drug use: No   Sexual activity: Not Currently  Other Topics Concern   Not on file  Social History Narrative   Lives with brother   Social Determinants of Health   Financial Resource Strain: Not on file  Food Insecurity: Not on file  Transportation Needs: Not on file  Physical Activity: Not on file  Stress: Not on file  Social Connections: Not on  file  Intimate Partner Violence: Not on file    Outpatient Medications Prior to Visit  Medication Sig Dispense Refill   albuterol (PROAIR HFA) 108 (90 Base) MCG/ACT inhaler Inhale 2 puffs into the lungs every 6 (six) hours as needed for wheezing or shortness of breath. 18 g 0   amlodipine-olmesartan (AZOR) 10-20 MG tablet Take 1 tablet by mouth daily. 90 tablet 2   atorvastatin (LIPITOR) 10 MG tablet Take 1 tablet (10 mg total) by mouth daily. 90 tablet 3   blood glucose meter kit and supplies Per insurance preference. Check blood glucose once a day. Dx E11.65 1 each 11   glimepiride (AMARYL) 2 MG tablet Take 1 tablet by mouth once daily with breakfast 90 tablet 0   metFORMIN (GLUCOPHAGE) 1000 MG tablet Take 1 tablet (1,000 mg total) by mouth 2 (two) times daily with a meal. 180 tablet  3   olmesartan (BENICAR) 20 MG tablet Take 1 tablet by mouth daily.     QUEtiapine (SEROQUEL) 50 MG tablet TAKE 1 TABLET BY MOUTH AT BEDTIME 30 tablet 0   rosuvastatin (CRESTOR) 5 MG tablet Take 1 tablet by mouth at bedtime.     Vitamin D, Ergocalciferol, (DRISDOL) 1.25 MG (50000 UNIT) CAPS capsule Take 1 capsule (50,000 Units total) by mouth every 7 (seven) days. 8 capsule 2   ibuprofen (ADVIL) 600 MG tablet Take 1 tablet (600 mg total) by mouth 3 (three) times daily. 30 tablet 0   No facility-administered medications prior to visit.    Allergies  Allergen Reactions   Egg-Derived Products Nausea And Vomiting    ROS Review of Systems  Constitutional:  Negative for fatigue and fever.  Eyes:  Negative for visual disturbance.  Respiratory:  Negative for chest tightness and shortness of breath.   Cardiovascular:  Negative for chest pain and palpitations.  Musculoskeletal:        Acute left shoulder pain  Neurological:  Negative for dizziness and headaches.      Objective:    Physical Exam HENT:     Head: Normocephalic.     Right Ear: External ear normal.     Left Ear: External ear normal.     Nose: No congestion or rhinorrhea.     Mouth/Throat:     Mouth: Mucous membranes are moist.  Cardiovascular:     Rate and Rhythm: Regular rhythm.     Heart sounds: No murmur heard. Pulmonary:     Effort: No respiratory distress.     Breath sounds: Normal breath sounds.  Musculoskeletal:     Left shoulder: No swelling, deformity or effusion. Normal range of motion.     Comments: Positive empty can test Negative Neer's test and Hawkins test Range of motion of the left shoulder is intact No deformity seen   Neurological:     Mental Status: He is alert.     BP 138/84   Pulse 85   Ht 5\' 8"  (1.727 m)   Wt (!) 317 lb 0.6 oz (143.8 kg)   SpO2 94%   BMI 48.21 kg/m  Wt Readings from Last 3 Encounters:  02/07/23 (!) 317 lb 0.6 oz (143.8 kg)  11/04/22 (!) 323 lb (146.5 kg)   10/31/22 (!) 322 lb 1.9 oz (146.1 kg)    Lab Results  Component Value Date   TSH 1.500 10/31/2022   Lab Results  Component Value Date   WBC 5.7 10/31/2022   HGB 13.6 10/31/2022   HCT 43.3 10/31/2022   MCV 92  10/31/2022   PLT 306 10/31/2022   Lab Results  Component Value Date   NA 142 10/31/2022   K 4.7 10/31/2022   CO2 24 10/31/2022   GLUCOSE 116 (H) 10/31/2022   BUN 16 10/31/2022   CREATININE 1.26 10/31/2022   BILITOT 0.5 10/31/2022   ALKPHOS 102 10/31/2022   AST 12 10/31/2022   ALT 11 10/31/2022   PROT 6.9 10/31/2022   ALBUMIN 3.9 10/31/2022   CALCIUM 9.5 10/31/2022   ANIONGAP 9 04/06/2018   EGFR 66 10/31/2022   Lab Results  Component Value Date   CHOL 120 10/31/2022   Lab Results  Component Value Date   HDL 39 (L) 10/31/2022   Lab Results  Component Value Date   LDLCALC 66 10/31/2022   Lab Results  Component Value Date   TRIG 70 10/31/2022   Lab Results  Component Value Date   CHOLHDL 3.1 10/31/2022   Lab Results  Component Value Date   HGBA1C 7.2 (H) 10/31/2022      Assessment & Plan:  Acute pain of left shoulder Assessment & Plan: No recent trauma or injury reported Reports sleeping on the left side Reports an achy discomfort in the left shoulder Pain in the clinic is 4 out of 10 Pain waxes and wanes Nonradiating Does not affect ADL Pain is not worse at nighttime or lying affected side Pain is worse after prolonged immobility and is relieved with increased use of the affected shoulder No fever or chills reported No tenderness reported with shoulder flexion, extension, abduction, and adduction Symptoms likely of tendinitis of the left shoulder Will treat conservatively with naproxen 500 mg twice daily and rest Will follow-up in 2 weeks   Orders: -     Naproxen; Take 1 tablet (500 mg total) by mouth 2 (two) times daily with a meal for 14 days.  Dispense: 28 tablet; Refill: 0  Essential hypertension Assessment &  Plan: Controlled He takes amlodipine-olmesartan 10-20 daily Denies headaches, dizziness, and blurred vision Encouraged low-sodium diet with increased physical activities DASH diet reviewed Will assess CBC and CMP today  BP Readings from Last 3 Encounters:  02/07/23 138/84  11/04/22 (!) 146/80  10/31/22 135/72      Type 2 diabetes mellitus with hyperglycemia, without long-term current use of insulin (HCC) Assessment & Plan: He takes metformin 1000 mg daily, and glimepiride 2 mg daily He denies polyuria, polyphagia, polydipsia He reports compliance with treatment regimen Will assess globin A1c today Lab Results  Component Value Date   HGBA1C 7.2 (H) 10/31/2022      Other hyperlipidemia Assessment & Plan: He takes atorvastatin 10 mg daily He denies muscle aches and pain Will assess lipid levels today Lab Results  Component Value Date   CHOL 120 10/31/2022   HDL 39 (L) 10/31/2022   LDLCALC 66 10/31/2022   TRIG 70 10/31/2022   CHOLHDL 3.1 10/31/2022     Orders: -     Lipid panel -     CMP14+EGFR -     CBC with Differential/Platelet  IFG (impaired fasting glucose) -     Hemoglobin A1c  Vitamin D deficiency -     VITAMIN D 25 Hydroxy (Vit-D Deficiency, Fractures)  Other specified hypothyroidism -     TSH + free T4    Follow-up: Return in about 2 weeks (around 02/21/2023) for Left shoulder pain.   Gilmore Laroche, FNP

## 2023-02-07 NOTE — Assessment & Plan Note (Signed)
He takes atorvastatin 10 mg daily He denies muscle aches and pain Will assess lipid levels today Lab Results  Component Value Date   CHOL 120 10/31/2022   HDL 39 (L) 10/31/2022   LDLCALC 66 10/31/2022   TRIG 70 10/31/2022   CHOLHDL 3.1 10/31/2022

## 2023-02-10 ENCOUNTER — Telehealth: Payer: Self-pay | Admitting: Family Medicine

## 2023-02-10 NOTE — Telephone Encounter (Signed)
Pt states he stopped taking Naproxen due to side effects he's been having. Blood in urine & Dizziness. Wanted to know if there is another alternative available that could benefit more.

## 2023-02-10 NOTE — Telephone Encounter (Signed)
Kindly encourage the patient to take otc Tylnelol 500 mg q6hrs

## 2023-02-13 NOTE — Telephone Encounter (Signed)
Spoke with patient and advised

## 2023-02-18 LAB — CMP14+EGFR
ALT: 11 IU/L (ref 0–44)
AST: 9 IU/L (ref 0–40)
Albumin/Globulin Ratio: 1.5 (ref 1.2–2.2)
Albumin: 4.1 g/dL (ref 3.8–4.9)
Alkaline Phosphatase: 98 IU/L (ref 44–121)
BUN/Creatinine Ratio: 12 (ref 9–20)
BUN: 14 mg/dL (ref 6–24)
Bilirubin Total: 0.7 mg/dL (ref 0.0–1.2)
CO2: 25 mmol/L (ref 20–29)
Calcium: 9.4 mg/dL (ref 8.7–10.2)
Chloride: 101 mmol/L (ref 96–106)
Creatinine, Ser: 1.16 mg/dL (ref 0.76–1.27)
Globulin, Total: 2.7 g/dL (ref 1.5–4.5)
Glucose: 128 mg/dL — ABNORMAL HIGH (ref 70–99)
Potassium: 4.2 mmol/L (ref 3.5–5.2)
Sodium: 140 mmol/L (ref 134–144)
Total Protein: 6.8 g/dL (ref 6.0–8.5)
eGFR: 73 mL/min/{1.73_m2} (ref >59)

## 2023-02-18 LAB — CBC WITH DIFFERENTIAL/PLATELET
Basophils Absolute: 0 10*3/uL (ref 0.0–0.2)
Basos: 0 %
EOS (ABSOLUTE): 0.2 10*3/uL (ref 0.0–0.4)
Eos: 3 %
Hematocrit: 40.8 % (ref 37.5–51.0)
Hemoglobin: 13 g/dL (ref 13.0–17.7)
Immature Grans (Abs): 0 10*3/uL (ref 0.0–0.1)
Immature Granulocytes: 0 %
Lymphocytes Absolute: 1 10*3/uL (ref 0.7–3.1)
Lymphs: 18 %
MCH: 29.1 pg (ref 26.6–33.0)
MCHC: 31.9 g/dL (ref 31.5–35.7)
MCV: 91 fL (ref 79–97)
Monocytes Absolute: 0.6 10*3/uL (ref 0.1–0.9)
Monocytes: 11 %
Neutrophils Absolute: 3.8 10*3/uL (ref 1.4–7.0)
Neutrophils: 68 %
Platelets: 287 10*3/uL (ref 150–450)
RBC: 4.47 x10E6/uL (ref 4.14–5.80)
RDW: 12.9 % (ref 11.6–15.4)
WBC: 5.6 10*3/uL (ref 3.4–10.8)

## 2023-02-18 LAB — TSH+FREE T4
Free T4: 1.25 ng/dL (ref 0.82–1.77)
TSH: 1.8 u[IU]/mL (ref 0.450–4.500)

## 2023-02-18 LAB — LIPID PANEL
Chol/HDL Ratio: 3.3 ratio (ref 0.0–5.0)
Cholesterol, Total: 128 mg/dL (ref 100–199)
HDL: 39 mg/dL — ABNORMAL LOW (ref >39)
LDL Chol Calc (NIH): 75 mg/dL (ref 0–99)
Triglycerides: 68 mg/dL (ref 0–149)
VLDL Cholesterol Cal: 14 mg/dL (ref 5–40)

## 2023-02-18 LAB — VITAMIN D 25 HYDROXY (VIT D DEFICIENCY, FRACTURES): Vit D, 25-Hydroxy: 37.4 ng/mL (ref 30.0–100.0)

## 2023-02-18 LAB — HEMOGLOBIN A1C
Est. average glucose Bld gHb Est-mCnc: 163 mg/dL
Hgb A1c MFr Bld: 7.3 % — ABNORMAL HIGH (ref 4.8–5.6)

## 2023-02-21 ENCOUNTER — Other Ambulatory Visit: Payer: Self-pay | Admitting: Family Medicine

## 2023-02-21 DIAGNOSIS — E1165 Type 2 diabetes mellitus with hyperglycemia: Secondary | ICD-10-CM

## 2023-02-21 MED ORDER — GLIMEPIRIDE 4 MG PO TABS: 4.0000 mg | ORAL_TABLET | Freq: Every day | ORAL | 3 refills | Status: DC

## 2023-02-24 ENCOUNTER — Ambulatory Visit (HOSPITAL_COMMUNITY)
Admission: RE | Admit: 2023-02-24 | Discharge: 2023-02-24 | Disposition: A | Discharge status: Home / Self Care | Payer: Managed Care, Other (non HMO) | Source: Ambulatory Visit | Attending: Family Medicine | Admitting: Family Medicine

## 2023-02-24 ENCOUNTER — Other Ambulatory Visit: Payer: Self-pay | Admitting: Nurse Practitioner

## 2023-02-24 ENCOUNTER — Ambulatory Visit (INDEPENDENT_AMBULATORY_CARE_PROVIDER_SITE_OTHER): Payer: Managed Care, Other (non HMO) | Admitting: Family Medicine

## 2023-02-24 ENCOUNTER — Encounter: Payer: Self-pay | Admitting: Family Medicine

## 2023-02-24 VITALS — BP 142/84 | HR 81 | Ht 68.0 in | Wt 322.0 lb

## 2023-02-24 DIAGNOSIS — R5383 Other fatigue: Secondary | ICD-10-CM

## 2023-02-24 DIAGNOSIS — G4700 Insomnia, unspecified: Secondary | ICD-10-CM

## 2023-02-24 DIAGNOSIS — M25512 Pain in left shoulder: Secondary | ICD-10-CM

## 2023-02-24 DIAGNOSIS — G8929 Other chronic pain: Secondary | ICD-10-CM | POA: Insufficient documentation

## 2023-02-24 MED ORDER — CAPSAICIN 0.025 % EX CREA
TOPICAL_CREAM | Freq: Two times a day (BID) | CUTANEOUS | 0 refills | Status: AC
Start: 2023-02-24 — End: ?

## 2023-02-24 NOTE — Progress Notes (Signed)
Established Patient Office Visit  Subjective:  Patient ID: Robert Mcintyre, male    DOB: 1966/09/24  Age: 57 y.o. MRN: [id number]  CC:  Chief Complaint  Patient presents with   Chronic Care Management    2 week f/u for left shoulder. States he took Naproxen caused blood in urine after taking 3. Not using anything for the pain.    Fatigue    Pt reports extreme fatigue would like to discuss possible sleep study.    HPI Robert Mcintyre is a 57 y.o. male with past medical history of degenerative joint disease of lumbar spine, type 2 diabetes with hyperglycemia without long-term chronic use of insulin, and chronic left-sided low back pain with left-sided sciatica presents for f/u of  chronic medical conditions. For the details of today's visit, please refer to the assessment and plan.     Past Medical History:  Diagnosis Date   Arthritis    back (had surgery 98), knees   Asthma    uses albuterol during cold weather   Hypertension    Morbid obesity (HCC)    Periumbilical hernia    pt unaware   Prostate cancer (HCC)    prostate cancer around 2018, had surgery   Pure hypercholesterolemia 04/24/2020   Spondylolysis, lumbar region     Past Surgical History:  Procedure Laterality Date   COLONOSCOPY N/A 09/16/2016   Procedure: COLONOSCOPY;  Surgeon: West Bali, MD;  Location: [facility];  Service: Endoscopy;  Laterality: N/A;  1:00 PM   LYMPHADENECTOMY Bilateral 04/09/2018   Procedure: LYMPHADENECTOMY;  Surgeon: Heloise Purpura, MD;  Location: [facility];  Service: Urology;  Laterality: Bilateral;   POLYPECTOMY  09/16/2016   Procedure: POLYPECTOMY;  Surgeon: West Bali, MD;  Location: [facility];  Service: Endoscopy;;  ascending colon polyp, hepatic flexure polyp, descending colon polyp,    ROBOT ASSISTED LAPAROSCOPIC RADICAL PROSTATECTOMY N/A 04/09/2018   Procedure: XI ROBOTIC ASSISTED LAPAROSCOPIC RADICAL PROSTATECTOMY LEVEL 3;  Surgeon: Heloise Purpura, MD;  Location: [facility];   Service: Urology;  Laterality: N/A;  ONLY NEEDS 210 MIN FOR ALL PROCEDURES   SPINE SURGERY  05/1997   back fusion    Family History  Problem Relation Age of Onset   Heart disease Mother    Stroke Mother    Hypertension Mother    Diabetes Mother    Asthma Father    Hypertension Brother     Social History   Socioeconomic History   Marital status: Single    Spouse name: Not on file   Number of children: 0   Years of education: 12   Highest education level: Not on file  Occupational History   Occupation: truck driver  Tobacco Use   Smoking status: Former    Packs/day: 0.25    Years: 3.00    Additional pack years: 0.00    Total pack years: 0.75    Types: Cigarettes    Quit date: 1980    Years since quitting: 44.4   Smokeless tobacco: Never  Vaping Use   Vaping Use: Never used  Substance and Sexual Activity   Alcohol use: Not Currently    Comment: occasional; beer and some liquor; 6 pack last several weeks and moonshine lasts months   Drug use: No   Sexual activity: Not Currently  Other Topics Concern   Not on file  Social History Narrative   Lives with brother   Social Determinants of Health   Financial Resource Strain: Not  on file  Food Insecurity: Not on file  Transportation Needs: Not on file  Physical Activity: Not on file  Stress: Not on file  Social Connections: Not on file  Intimate Partner Violence: Not on file    Outpatient Medications Prior to Visit  Medication Sig Dispense Refill   albuterol (PROAIR HFA) 108 (90 Base) MCG/ACT inhaler Inhale 2 puffs into the lungs every 6 (six) hours as needed for wheezing or shortness of breath. 18 g 0   amlodipine-olmesartan (AZOR) 10-20 MG tablet Take 1 tablet by mouth daily. 90 tablet 2   atorvastatin (LIPITOR) 10 MG tablet Take 1 tablet (10 mg total) by mouth daily. 90 tablet 3   blood glucose meter kit and supplies Per insurance preference. Check blood glucose once a day. Dx E11.65 1 each 11   glimepiride  (AMARYL) 4 MG tablet Take 1 tablet (4 mg total) by mouth daily before breakfast. 30 tablet 3   metFORMIN (GLUCOPHAGE) 1000 MG tablet Take 1 tablet (1,000 mg total) by mouth 2 (two) times daily with a meal. 180 tablet 3   olmesartan (BENICAR) 20 MG tablet Take 1 tablet by mouth daily.     QUEtiapine (SEROQUEL) 50 MG tablet TAKE 1 TABLET BY MOUTH AT BEDTIME 30 tablet 0   rosuvastatin (CRESTOR) 5 MG tablet Take 1 tablet by mouth at bedtime.     Vitamin D, Ergocalciferol, (DRISDOL) 1.25 MG (50000 UNIT) CAPS capsule Take 1 capsule (50,000 Units total) by mouth every 7 (seven) days. 8 capsule 2   No facility-administered medications prior to visit.    Allergies  Allergen Reactions   Egg-Derived Products Nausea And Vomiting    ROS Review of Systems  Constitutional:  Positive for fatigue. Negative for fever.  Eyes:  Negative for visual disturbance.  Respiratory:  Negative for chest tightness and shortness of breath.   Cardiovascular:  Negative for chest pain and palpitations.  Musculoskeletal:        Left shoulder pain  Neurological:  Negative for dizziness and headaches.      Objective:    Physical Exam HENT:     Head: Normocephalic.     Right Ear: External ear normal.     Left Ear: External ear normal.     Nose: No congestion or rhinorrhea.     Mouth/Throat:     Mouth: Mucous membranes are moist.  Cardiovascular:     Rate and Rhythm: Regular rhythm.     Heart sounds: No murmur heard. Pulmonary:     Effort: No respiratory distress.     Breath sounds: Normal breath sounds.  Musculoskeletal:     Right shoulder: Tenderness present.     Comments: Low with range of motion to the left shoulder  Neurological:     Mental Status: He is alert.     BP (!) 142/84 (BP Location: Right Arm)   Pulse 81   Ht 5\' 8"  (1.727 m)   Wt (!) 322 lb 0.6 oz (146.1 kg)   SpO2 93%   BMI 48.97 kg/m  Wt Readings from Last 3 Encounters:  02/24/23 (!) 322 lb 0.6 oz (146.1 kg)  02/07/23 (!) 317 lb  0.6 oz (143.8 kg)  11/04/22 (!) 323 lb (146.5 kg)    Lab Results  Component Value Date   TSH 1.800 02/17/2023   Lab Results  Component Value Date   WBC 5.6 02/17/2023   HGB 13.0 02/17/2023   HCT 40.8 02/17/2023   MCV 91 02/17/2023   PLT 287 02/17/2023  Lab Results  Component Value Date   NA 140 02/17/2023   K 4.2 02/17/2023   CO2 25 02/17/2023   GLUCOSE 128 (H) 02/17/2023   BUN 14 02/17/2023   CREATININE 1.16 02/17/2023   BILITOT 0.7 02/17/2023   ALKPHOS 98 02/17/2023   AST 9 02/17/2023   ALT 11 02/17/2023   PROT 6.8 02/17/2023   ALBUMIN 4.1 02/17/2023   CALCIUM 9.4 02/17/2023   ANIONGAP 9 04/06/2018   EGFR 73 02/17/2023   Lab Results  Component Value Date   CHOL 128 02/17/2023   Lab Results  Component Value Date   HDL 39 (L) 02/17/2023   Lab Results  Component Value Date   LDLCALC 75 02/17/2023   Lab Results  Component Value Date   TRIG 68 02/17/2023   Lab Results  Component Value Date   CHOLHDL 3.3 02/17/2023   Lab Results  Component Value Date   HGBA1C 7.3 (H) 02/17/2023      Assessment & Plan:  Chronic left shoulder pain Assessment & Plan: No recent injury or trauma reported to the left shoulder Likely arthritis He reports pain with certain movements such as abduction He reports not taking naproxen after 3 days due to symptom of drowsiness and slight blood-tinged urine The patient is a truck driver, and declines muscle relaxants due to side effects of drowsiness Will get an x-ray of the left shoulder today Left shoulder is neurovascularly intact Sensation is intact Range of motion is intact with slight discomfort with abduction and extension No systemic symptoms reported Encouraged alternating with heat and cold therapy and application of capsaicin cream     Orders: -     DG Shoulder Left -     Capsaicin; Apply topically 2 (two) times daily.  Dispense: 60 g; Refill: 0  Other fatigue Assessment & Plan: BMI is 48.97 Reports  minimal physical activity and admits to poor eating habits Encouraged weight loss, and a nutritious meal Encouraged to take over-the-counter B12 1000 mcg daily Will continue to monitor     Follow-up: Return in about 3 months (around 05/27/2023).   Gilmore Laroche, FNP

## 2023-02-24 NOTE — Assessment & Plan Note (Signed)
BMI is 48.97 Reports minimal physical activity and admits to poor eating habits Encouraged weight loss, and a nutritious meal Encouraged to take over-the-counter B12 1000 mcg daily Will continue to monitor

## 2023-02-24 NOTE — Patient Instructions (Addendum)
I appreciate the opportunity to provide care to you today!    Follow up:  3 months  Fatigue - B12 daily  I recommend increasing your physical activity and eating a nutritious meal   Physical activity helps: Lower your blood glucose, improve your heart health, lower your blood pressure and cholesterol, burn calories to help manage her weight, gave you energy, lower stress, and improve his sleep.  The American diabetes Association (ADA) recommends being active for 2-1/2 hours (150 minutes) or more week.  Exercise for 30 minutes, 5 days a week (150 minutes total)    Encouraged to Exercise: If you are able: 30 -60 minutes a day ,4 days a week, or 150 minutes a week. The longer the better. Combine stretch, strength, and aerobic activities  Encourage to eat whole Food, Plant Predominant Nutrition is highly recommended: Eat Plenty of vegetables, Mushrooms, fruits, Legumes, Whole Grains, Nuts, seeds in lieu of processed meats, processed snacks/pastries red meat, poultry, eggs.      Left shoulder pain Shoulder exercises (see printed clinical reference) Topical Capsaicin cream twice daily Take Tylenol 650 mg every 8 hours as needed for pain relief  I recommend ice and heat therapy at the affected site Apply heat to the affected area such as a moist heat pack or a heating pad. Place a towel between your skin and the heat source. Leave the heat on for 20-30 minutes. Remove the heat if your skin turns bright red. This is especially important if you are unable to feel pain, heat, or cold. You may have a greater risk of getting burned. Apply  ice on the painful area. To do this: If you have a removable splint, remove it as told by your health care provider. Put ice in a plastic bag. Place a towel between your skin and the bag or between your splint and the bag. Leave the ice on for 20 minutes, 2-3 times a day.     Referrals today-  DG of left shoulder     Please continue to a  heart-healthy diet and increase your physical activities. Try to exercise for at least five days a week.      It was a pleasure to see you and I look forward to continuing to work together on your health and well-being. Please do not hesitate to call the office if you need care or have questions about your care.   Have a wonderful day and week. With Gratitude, Gilmore Laroche MSN, FNP-BC

## 2023-02-24 NOTE — Assessment & Plan Note (Signed)
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

## 2023-02-27 ENCOUNTER — Other Ambulatory Visit: Payer: Self-pay | Admitting: Family Medicine

## 2023-02-27 DIAGNOSIS — G8929 Other chronic pain: Secondary | ICD-10-CM

## 2023-02-27 NOTE — Telephone Encounter (Signed)
Please advise Kh 

## 2023-03-13 ENCOUNTER — Encounter: Payer: Self-pay | Admitting: Orthopedic Surgery

## 2023-03-13 ENCOUNTER — Ambulatory Visit: Payer: Managed Care, Other (non HMO) | Admitting: Orthopedic Surgery

## 2023-03-13 DIAGNOSIS — S46012A Strain of muscle(s) and tendon(s) of the rotator cuff of left shoulder, initial encounter: Secondary | ICD-10-CM | POA: Diagnosis not present

## 2023-03-13 NOTE — Progress Notes (Signed)
Office Visit Note   Patient: Robert Mcintyre           Date of Birth: Jan 06, 1966           MRN: [id number] Visit Date: 03/13/2023 Requested by: Gilmore Laroche, FNP [address] [place],  [place] [address] PCP: Gilmore Laroche, FNP  Subjective: Chief Complaint  Patient presents with   Left Shoulder - Pain    HPI: Robert Mcintyre is a 57 y.o. male who presents to the office reporting left shoulder pain.  Pain has been going on for slightly over a month.  Denies any history of injury.  Patient states that out of the blue his shoulder popped and this caused pain off and on since that time.  Sometimes he is able to lay on that left-hand side.  Does report decreased range of motion.  Localizes pain to the back of the shoulder.  Minimal neck pain.  Tylenol is not helping too much.  Hard for him to AB duct the arm in the morning but it does get better as time goes by.  He denies any subjective weakness with activities.  He works as a [employer]..                ROS: All systems reviewed are negative as they relate to the chief complaint within the history of present illness.  Patient denies fevers or chills.  Assessment & Plan: Visit Diagnoses: No diagnosis found.  Plan: Impression is left shoulder pain which may be a small rotator cuff tear.  Symptoms only ongoing for months.  He does have slight external rotation weakness on the left versus the right.  He is diabetic with hemoglobin A1c 7.3.  Plan is return office visit in 6 weeks with decision for or against MRI scan at that time.  Follow-Up Instructions: No follow-ups on file.   Orders:  No orders of the defined types were placed in this encounter.  No orders of the defined types were placed in this encounter.     Procedures: No procedures performed   Clinical Data: No additional findings.  Objective: Vital Signs: There were no vitals taken for this visit.  Physical Exam:  Constitutional: Patient appears  well-developed HEENT:  Head: Normocephalic Eyes:EOM are normal Neck: Normal range of motion Cardiovascular: Normal rate Pulmonary/chest: Effort normal Neurologic: Patient is alert Skin: Skin is warm Psychiatric: Patient has normal mood and affect  Ortho Exam: Ortho exam demonstrates symmetric passive range of motion bilaterally.  He does have about 5- out of 5 external rotation strength on the left compared to 5+ out of 5 on the right.  Subscap strength is intact.  No Popeye deformity on the left-hand side.  No discrete AC joint tenderness left versus right.  Neck range of motion is full.  No paresthesias C5-T1.  Specialty Comments:  No specialty comments available.  Imaging: No results found.   PMFS History: Patient Active Problem List   Diagnosis Date Noted   Chronic left shoulder pain 02/07/2023   Musculoskeletal pain of upper extremity, left 10/12/2022   Vitamin D deficiency 06/29/2022   Seasonal allergies 06/29/2022   Encounter for routine adult physical exam with abnormal findings 04/22/2022   Fatigue 04/22/2022   Need for varicella vaccine 11/04/2021   Need for immunization against influenza 11/04/2021   Type 2 diabetes mellitus with hyperglycemia, without long-term current use of insulin (HCC) 11/04/2021   Chronic left-sided low back pain with left-sided sciatica 09/06/2021  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) 06/07/2021   Mass of skin of back 11/13/2020   Hyperlipidemia 10/16/2020   Insomnia, unspecified 10/16/2020   Pure hypercholesterolemia 04/24/2020   Poorly controlled diabetes mellitus (HCC) 01/25/2019   Prostate cancer (HCC) 04/09/2018   Essential hypertension 03/20/2017   Tubular adenoma of colon 09/20/2016   Encounter to establish care    Morbid obesity (HCC) 07/18/2016   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   Past Medical History:  Diagnosis Date   Arthritis    back (had surgery 98), knees   Asthma     uses albuterol during cold weather   Hypertension    Morbid obesity (HCC)    Periumbilical hernia    pt unaware   Prostate cancer (HCC)    prostate cancer around 2018, had surgery   Pure hypercholesterolemia 04/24/2020   Spondylolysis, lumbar region     Family History  Problem Relation Age of Onset   Heart disease Mother    Stroke Mother    Hypertension Mother    Diabetes Mother    Asthma Father    Hypertension Brother     Past Surgical History:  Procedure Laterality Date   COLONOSCOPY N/A 09/16/2016   Procedure: COLONOSCOPY;  Surgeon: West Bali, MD;  [facility]: [facility];  Service: Endoscopy;  Laterality: N/A;  1:00 PM   LYMPHADENECTOMY Bilateral 04/09/2018   Procedure: LYMPHADENECTOMY;  Surgeon: Heloise Purpura, MD;  Location: [facility];  Service: Urology;  Laterality: Bilateral;   POLYPECTOMY  09/16/2016   Procedure: POLYPECTOMY;  Surgeon: West Bali, MD;  Location: [facility];  Service: Endoscopy;;  ascending colon polyp, hepatic flexure polyp, descending colon polyp,    ROBOT ASSISTED LAPAROSCOPIC RADICAL PROSTATECTOMY N/A 04/09/2018   Procedure: XI ROBOTIC ASSISTED LAPAROSCOPIC RADICAL PROSTATECTOMY LEVEL 3;  Surgeon: Heloise Purpura, MD;  Location: [facility];  Service: Urology;  Laterality: N/A;  ONLY NEEDS 210 MIN FOR ALL PROCEDURES   SPINE SURGERY  05/1997   back fusion   Social History   Occupational History   Occupation: truck Hospital doctor  Tobacco Use   Smoking status: Former    Packs/day: 0.25    Years: 3.00    Additional pack years: 0.00    Total pack years: 0.75    Types: Cigarettes    Quit date: 1980    Years since quitting: 44.4   Smokeless tobacco: Never  Vaping Use   Vaping Use: Never used  Substance and Sexual Activity   Alcohol use: Not Currently    Comment: occasional; beer and some liquor; 6 pack last several weeks and moonshine lasts months   Drug use: No   Sexual activity: Not Currently

## 2023-04-04 ENCOUNTER — Other Ambulatory Visit: Payer: Self-pay | Admitting: Family Medicine

## 2023-04-04 DIAGNOSIS — G4700 Insomnia, unspecified: Secondary | ICD-10-CM

## 2023-04-04 MED ORDER — QUETIAPINE FUMARATE 50 MG PO TABS: 50.0000 mg | ORAL_TABLET | Freq: Every day | ORAL | 0 refills | Status: DC

## 2023-04-26 ENCOUNTER — Ambulatory Visit (INDEPENDENT_AMBULATORY_CARE_PROVIDER_SITE_OTHER): Payer: Managed Care, Other (non HMO) | Admitting: Orthopedic Surgery

## 2023-04-26 DIAGNOSIS — S46012A Strain of muscle(s) and tendon(s) of the rotator cuff of left shoulder, initial encounter: Secondary | ICD-10-CM | POA: Diagnosis not present

## 2023-04-27 ENCOUNTER — Other Ambulatory Visit: Payer: Self-pay | Admitting: Family Medicine

## 2023-04-27 DIAGNOSIS — I10 Essential (primary) hypertension: Secondary | ICD-10-CM

## 2023-04-28 ENCOUNTER — Encounter: Payer: Self-pay | Admitting: Orthopedic Surgery

## 2023-04-28 NOTE — Progress Notes (Signed)
Office Visit Note   Patient: Robert Mcintyre           Date of Birth: 28-Oct-1965           MRN: [id number] Visit Date: 04/26/2023 Requested by: Gilmore Laroche, FNP [address] [place],  [place] [address] PCP: Gilmore Laroche, FNP  Subjective: Chief Complaint  Patient presents with   Left Shoulder - Pain    HPI: Robert Mcintyre is a 57 y.o. male who presents to the office reporting left shoulder pain.  Patient describes worsening pain since last office visit.  Unable to do certain movements.  Hard for him to sleep on the left-hand side.  Pain radiates with abduction of that left arm.  He has to constantly readjust the arm position.  Occasionally his symptoms improved when he goes overhead as well as take the arm across his chest.  No radiation of symptoms down to the fingers.  Does report weakness at times.  Patient does have diabetes but last hemoglobin A1c 7.3.Marland Kitchen                ROS: All systems reviewed are negative as they relate to the chief complaint within the history of present illness.  Patient denies fevers or chills.  Assessment & Plan: Visit Diagnoses:  1. Traumatic incomplete tear of left rotator cuff, initial encounter     Plan: Impression is persistent left shoulder pain with possible rotator cuff pathology present.  Does have a little weakness on exam.  No evidence of frozen shoulder.  MRI arthrogram left shoulder indicated after failed conservative treatment and long duration of symptoms.  Follow-up after that study.  Follow-Up Instructions: No follow-ups on file.   Orders:  Orders Placed This Encounter  Procedures   MR Shoulder Left w/ contrast   Arthrogram   No orders of the defined types were placed in this encounter.     Procedures: No procedures performed   Clinical Data: No additional findings.  Objective: Vital Signs: There were no vitals taken for this visit.  Physical Exam:  Constitutional: Patient appears well-developed HEENT:  Head:  Normocephalic Eyes:EOM are normal Neck: Normal range of motion Cardiovascular: Normal rate Pulmonary/chest: Effort normal Neurologic: Patient is alert Skin: Skin is warm Psychiatric: Patient has normal mood and affect  Ortho Exam: Ortho exam demonstrates passive range of motion of that left shoulder of 80/100/175.  Has a little bit of coarseness with internal/external rotation at 90 degrees of abduction on the left.  Deltoid fires.  No discrete AC joint tenderness left versus right.  Mild biceps tendon tenderness in the bicipital groove on the left.  Specialty Comments:  No specialty comments available.  Imaging: No results found.   PMFS History: Patient Active Problem List   Diagnosis Date Noted   Chronic left shoulder pain 02/07/2023   Musculoskeletal pain of upper extremity, left 10/12/2022   Vitamin D deficiency 06/29/2022   Seasonal allergies 06/29/2022   Encounter for routine adult physical exam with abnormal findings 04/22/2022   Fatigue 04/22/2022   Need for varicella vaccine 11/04/2021   Need for immunization against influenza 11/04/2021   Type 2 diabetes mellitus with hyperglycemia, without long-term current use of insulin (HCC) 11/04/2021   Chronic left-sided low back pain with left-sided sciatica 09/06/2021   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) 06/07/2021   Mass of skin of back 11/13/2020   Hyperlipidemia 10/16/2020   Insomnia, unspecified 10/16/2020   Pure hypercholesterolemia 04/24/2020   Poorly controlled  diabetes mellitus (HCC) 01/25/2019   Prostate cancer (HCC) 04/09/2018   Essential hypertension 03/20/2017   Tubular adenoma of colon 09/20/2016   Encounter to establish care    Morbid obesity (HCC) 07/18/2016   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   Past Medical History:  Diagnosis Date   Arthritis    back (had surgery 98), knees   Asthma    uses albuterol during cold weather   Hypertension    Morbid  obesity (HCC)    Periumbilical hernia    pt unaware   Prostate cancer (HCC)    prostate cancer around 2018, had surgery   Pure hypercholesterolemia 04/24/2020   Spondylolysis, lumbar region     Family History  Problem Relation Age of Onset   Heart disease Mother    Stroke Mother    Hypertension Mother    Diabetes Mother    Asthma Father    Hypertension Brother     Past Surgical History:  Procedure Laterality Date   COLONOSCOPY N/A 09/16/2016   Procedure: COLONOSCOPY;  Surgeon: West Bali, MD;  [facility]: [facility];  Service: Endoscopy;  Laterality: N/A;  1:00 PM   LYMPHADENECTOMY Bilateral 04/09/2018   Procedure: LYMPHADENECTOMY;  Surgeon: Heloise Purpura, MD;  Location: [facility];  Service: Urology;  Laterality: Bilateral;   POLYPECTOMY  09/16/2016   Procedure: POLYPECTOMY;  Surgeon: West Bali, MD;  [facility]: [facility];  Service: Endoscopy;;  ascending colon polyp, hepatic flexure polyp, descending colon polyp,    ROBOT ASSISTED LAPAROSCOPIC RADICAL PROSTATECTOMY N/A 04/09/2018   Procedure: XI ROBOTIC ASSISTED LAPAROSCOPIC RADICAL PROSTATECTOMY LEVEL 3;  Surgeon: Heloise Purpura, MD;  Location: [facility];  Service: Urology;  Laterality: N/A;  ONLY NEEDS 210 MIN FOR ALL PROCEDURES   SPINE SURGERY  05/1997   back fusion   Social History   Occupational History   Occupation: truck driver  Tobacco Use   Smoking status: Former    Current packs/day: 0.00    Average packs/day: 0.3 packs/day for 3.0 years (0.8 ttl pk-yrs)    Types: Cigarettes    Start date: 41    Quit date: 1980    Years since quitting: 44.6   Smokeless tobacco: Never  Vaping Use   Vaping status: Never Used  Substance and Sexual Activity   Alcohol use: Not Currently    Comment: occasional; beer and some liquor; 6 pack last several weeks and moonshine lasts months   Drug use: No   Sexual activity: Not Currently

## 2023-04-30 ENCOUNTER — Other Ambulatory Visit: Payer: Self-pay | Admitting: Family Medicine

## 2023-04-30 DIAGNOSIS — G4700 Insomnia, unspecified: Secondary | ICD-10-CM

## 2023-05-24 ENCOUNTER — Ambulatory Visit: Payer: Managed Care, Other (non HMO) | Admitting: Orthopedic Surgery

## 2023-05-26 ENCOUNTER — Encounter: Payer: Self-pay | Admitting: Family Medicine

## 2023-05-26 ENCOUNTER — Ambulatory Visit (INDEPENDENT_AMBULATORY_CARE_PROVIDER_SITE_OTHER): Payer: Managed Care, Other (non HMO) | Admitting: Family Medicine

## 2023-05-26 VITALS — BP 140/82 | HR 81 | Ht 68.0 in | Wt 312.0 lb

## 2023-05-26 DIAGNOSIS — Z23 Encounter for immunization: Secondary | ICD-10-CM | POA: Diagnosis not present

## 2023-05-26 DIAGNOSIS — E038 Other specified hypothyroidism: Secondary | ICD-10-CM

## 2023-05-26 DIAGNOSIS — R7301 Impaired fasting glucose: Secondary | ICD-10-CM

## 2023-05-26 DIAGNOSIS — E7849 Other hyperlipidemia: Secondary | ICD-10-CM

## 2023-05-26 DIAGNOSIS — E1165 Type 2 diabetes mellitus with hyperglycemia: Secondary | ICD-10-CM

## 2023-05-26 DIAGNOSIS — I10 Essential (primary) hypertension: Secondary | ICD-10-CM | POA: Diagnosis not present

## 2023-05-26 DIAGNOSIS — Z7984 Long term (current) use of oral hypoglycemic drugs: Secondary | ICD-10-CM | POA: Diagnosis not present

## 2023-05-26 DIAGNOSIS — E559 Vitamin D deficiency, unspecified: Secondary | ICD-10-CM

## 2023-05-26 NOTE — Assessment & Plan Note (Signed)
The patient denies experiencing polyuria, polyphagia, or polydipsia. He has been taking metformin 1000 mg twice daily and has discontinued glimepiride 4 mg daily due to symptoms of tremors. The patient also reported not monitoring his blood sugar levels during episodes of tremors. I encourage him to decrease his intake of high-sugar foods and beverages and to increase his physical activity to help manage his condition. Lab Results  Component Value Date   HGBA1C 7.3 (H) 02/17/2023

## 2023-05-26 NOTE — Patient Instructions (Addendum)
I appreciate the opportunity to provide care to you today!    Follow up:  1 month  Labs: please stop by the lab today to get your blood drawn (CBC, CMP, TSH, Lipid profile, HgA1c, Vit D)  Hypertension  -Your blood pressure elevated today. -To manage your hypertension effectively, please follow these recommendations: adhere to a low-sodium diet and increase your physical activity, referencing the DASH eating plan attached to your After Visit Summary (AVS) for guidance. - Aim to keep your blood pressure below 140/90 mmHg.  -Continue taking your amlodipine-olmesartan, 10-20 mg daily, and measure your blood pressure daily, waiting at least one hour after taking your medication before checking it.  -If your initial reading exceeds 140/90 mmHg, take a second reading after 10 minutes and record the second measurements. -Bring your blood pressure log to your next appointment for review.  Adhering to these steps will help in managing your condition and reducing cardiovascular risk.   Type 2 diabetes -Continue taking metformin 1000 mg BID -I recommend making lifestyle changes. This includes avoiding simple carbohydrates such as cakes, sweet desserts, ice cream, soda (diet or regular), sweet tea, candies, chips, cookies, store-bought juices, excessive alcohol (more than 1-2 drinks per day), lemonade, artificial sweeteners, donuts, coffee creamers, and sugar-free products.    Weight Loss Tips -Incorporate Nutrient-Dense Foods: Increase your intake of fruits, vegetables, and whole grains to enhance your diet with essential vitamins, minerals, and fiber. -Choose Lean Proteins: Opt for lean protein sources such as chicken, fish, beans, and legumes to support muscle maintenance and overall health. -Select Low-Fat Dairy Products: Include low-fat dairy options to obtain necessary calcium and protein while minimizing excess fat intake. -Reduce Saturated and Trans Fats: Limit your consumption of saturated fats,  trans fatty acids, and cholesterol to promote heart health and support weight management. -Engage in Regular Physical Activity: Aim to be active for at least 30 minutes on most days of the week. Activities such as brisk walking can help you maintain a healthy weight and improve overall well-being.  Attached with your AVS, you will find valuable resources for self-education. I highly recommend dedicating some time to thoroughly examine them.   Please continue to a heart-healthy diet and increase your physical activities. Try to exercise for at least five days a week.    It was a pleasure to see you and I look forward to continuing to work together on your health and well-being. Please do not hesitate to call the office if you need care or have questions about your care.  In case of emergency, please visit the Emergency Department for urgent care, or contact our clinic at [phone] to schedule an appointment. We're here to help you!   Have a wonderful day and week. With Gratitude, Gilmore Laroche MSN, FNP-BC

## 2023-05-26 NOTE — Assessment & Plan Note (Signed)
-  Uncontrolled  -The patient reports elevated blood pressure today, attributing it to significant stress. He has been consuming a lot of processed foods and eating out frequently. 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 His current BMI is 47.45, though he has successfully lost 12 pounds since his last visit on Feb 24, 2023. -I congratulated the patient on his weight loss efforts and encouraged him to continue with his treatment regimen of amlodipine-olmesartan, 10-20 mg daily. The patient should check his blood pressure once daily, repeat the measurement if it is greater than 140/90 mmHg, and document the second reading. He is advised to bring this blood pressure log to his next appointment. -Additionally, I recommend adhering to a low-sodium diet, increasing physical activity, and reviewing the cardiovascular risks associated with hypertension.  BP Readings from Last 3 Encounters:  05/26/23 (!) 140/82  02/24/23 (!) 142/84  02/07/23 138/84

## 2023-05-26 NOTE — Assessment & Plan Note (Addendum)
The patient is currently taking atorvastatin 10 mg daily and reports full compliance with the treatment regimen.  Recommendations:  Dietary Adjustments: Decrease intake of cholesterol, saturated fats, and trans fats. Increase consumption of fruits, vegetables, lean meats, and foods rich in omega-3 fatty acids. Physical Activity: Engage in regular physical activity to complement dietary changes and support overall cardiovascular health. Lab Results  Component Value Date   CHOL 128 02/17/2023   HDL 39 (L) 02/17/2023   LDLCALC 75 02/17/2023   TRIG 68 02/17/2023   CHOLHDL 3.3 02/17/2023

## 2023-05-26 NOTE — Progress Notes (Signed)
Established Patient Office Visit  Subjective:  Patient ID: Robert Mcintyre, male    DOB: 06-Sep-1966  Age: 57 y.o. MRN: [id number]  CC:  Chief Complaint  Patient presents with   Care Management    Following up. Pt reports cutting back on glimiperide due to jitters.     HPI Robert Mcintyre is a 57 y.o. male with past medical history of hypertension, hyperlipidemia and type 2 diabetes presents for f/u of  chronic medical conditions. For the details of today's visit, please refer to the assessment and plan.     Past Medical History:  Diagnosis Date   Arthritis    back (had surgery 98), knees   Asthma    uses albuterol during cold weather   Hypertension    Morbid obesity (HCC)    Periumbilical hernia    pt unaware   Prostate cancer (HCC)    prostate cancer around 2018, had surgery   Pure hypercholesterolemia 04/24/2020   Spondylolysis, lumbar region     Past Surgical History:  Procedure Laterality Date   COLONOSCOPY N/A 09/16/2016   Procedure: COLONOSCOPY;  Surgeon: West Bali, MD;  Location: [facility];  Service: Endoscopy;  Laterality: N/A;  1:00 PM   LYMPHADENECTOMY Bilateral 04/09/2018   Procedure: LYMPHADENECTOMY;  Surgeon: Heloise Purpura, MD;  Location: [facility];  Service: Urology;  Laterality: Bilateral;   POLYPECTOMY  09/16/2016   Procedure: POLYPECTOMY;  Surgeon: West Bali, MD;  Location: [facility];  Service: Endoscopy;;  ascending colon polyp, hepatic flexure polyp, descending colon polyp,    ROBOT ASSISTED LAPAROSCOPIC RADICAL PROSTATECTOMY N/A 04/09/2018   Procedure: XI ROBOTIC ASSISTED LAPAROSCOPIC RADICAL PROSTATECTOMY LEVEL 3;  Surgeon: Heloise Purpura, MD;  Location: [facility];  Service: Urology;  Laterality: N/A;  ONLY NEEDS 210 MIN FOR ALL PROCEDURES   SPINE SURGERY  05/1997   back fusion    Family History  Problem Relation Age of Onset   Heart disease Mother    Stroke Mother    Hypertension Mother    Diabetes Mother    Asthma Father     Hypertension Brother     Social History   Socioeconomic History   Marital status: Single    Spouse name: Not on file   Number of children: 0   Years of education: 12   Highest education level: Not on file  Occupational History   Occupation: truck driver  Tobacco Use   Smoking status: Former    Current packs/day: 0.00    Average packs/day: 0.3 packs/day for 3.0 years (0.8 ttl pk-yrs)    Types: Cigarettes    Start date: 60    Quit date: 1980    Years since quitting: 44.6   Smokeless tobacco: Never  Vaping Use   Vaping status: Never Used  Substance and Sexual Activity   Alcohol use: Not Currently    Comment: occasional; beer and some liquor; 6 pack last several weeks and moonshine lasts months   Drug use: No   Sexual activity: Not Currently  Other Topics Concern   Not on file  Social History Narrative   Lives with brother   Social Determinants of Health   Financial Resource Strain: Not on file  Food Insecurity: Not on file  Transportation Needs: Not on file  Physical Activity: Not on file  Stress: Not on file  Social Connections: Not on file  Intimate Partner Violence: Not on file    Outpatient Medications Prior to Visit  Medication Sig  Dispense Refill   albuterol (PROAIR HFA) 108 (90 Base) MCG/ACT inhaler Inhale 2 puffs into the lungs every 6 (six) hours as needed for wheezing or shortness of breath. 18 g 0   amlodipine-olmesartan (AZOR) 10-20 MG tablet Take 1 tablet by mouth once daily 90 tablet 0   atorvastatin (LIPITOR) 10 MG tablet Take 1 tablet (10 mg total) by mouth daily. 90 tablet 3   blood glucose meter kit and supplies Per insurance preference. Check blood glucose once a day. Dx E11.65 1 each 11   glimepiride (AMARYL) 4 MG tablet Take 1 tablet (4 mg total) by mouth daily before breakfast. 30 tablet 3   metFORMIN (GLUCOPHAGE) 1000 MG tablet Take 1 tablet (1,000 mg total) by mouth 2 (two) times daily with a meal. 180 tablet 3   olmesartan (BENICAR) 20 MG  tablet Take 1 tablet by mouth daily.     QUEtiapine (SEROQUEL) 50 MG tablet TAKE 1 TABLET BY MOUTH AT BEDTIME 30 tablet 0   Vitamin D, Ergocalciferol, (DRISDOL) 1.25 MG (50000 UNIT) CAPS capsule Take 1 capsule (50,000 Units total) by mouth every 7 (seven) days. 8 capsule 2   rosuvastatin (CRESTOR) 5 MG tablet Take 1 tablet by mouth at bedtime.     capsaicin (ZOSTRIX) 0.025 % cream Apply topically 2 (two) times daily. 60 g 0   No facility-administered medications prior to visit.    Allergies  Allergen Reactions   Egg-Derived Products Nausea And Vomiting    ROS Review of Systems  Constitutional:  Negative for fatigue and fever.  Eyes:  Negative for visual disturbance.  Respiratory:  Negative for chest tightness and shortness of breath.   Cardiovascular:  Negative for chest pain and palpitations.  Neurological:  Negative for dizziness and headaches.      Objective:    Physical Exam HENT:     Head: Normocephalic.     Right Ear: External ear normal.     Left Ear: External ear normal.     Nose: No congestion or rhinorrhea.     Mouth/Throat:     Mouth: Mucous membranes are moist.  Cardiovascular:     Rate and Rhythm: Regular rhythm.     Heart sounds: No murmur heard. Pulmonary:     Effort: No respiratory distress.     Breath sounds: Normal breath sounds.  Neurological:     Mental Status: He is alert.     BP (!) 140/82 (BP Location: Left Arm)   Pulse 81   Ht 5\' 8"  (1.727 m)   Wt (!) 312 lb 0.6 oz (141.5 kg)   SpO2 96%   BMI 47.45 kg/m  Wt Readings from Last 3 Encounters:  05/26/23 (!) 312 lb 0.6 oz (141.5 kg)  02/24/23 (!) 322 lb 0.6 oz (146.1 kg)  02/07/23 (!) 317 lb 0.6 oz (143.8 kg)    Lab Results  Component Value Date   TSH 1.800 02/17/2023   Lab Results  Component Value Date   WBC 5.6 02/17/2023   HGB 13.0 02/17/2023   HCT 40.8 02/17/2023   MCV 91 02/17/2023   PLT 287 02/17/2023   Lab Results  Component Value Date   NA 140 02/17/2023   K 4.2  02/17/2023   CO2 25 02/17/2023   GLUCOSE 128 (H) 02/17/2023   BUN 14 02/17/2023   CREATININE 1.16 02/17/2023   BILITOT 0.7 02/17/2023   ALKPHOS 98 02/17/2023   AST 9 02/17/2023   ALT 11 02/17/2023   PROT 6.8 02/17/2023   ALBUMIN  4.1 02/17/2023   CALCIUM 9.4 02/17/2023   ANIONGAP 9 04/06/2018   EGFR 73 02/17/2023   Lab Results  Component Value Date   CHOL 128 02/17/2023   Lab Results  Component Value Date   HDL 39 (L) 02/17/2023   Lab Results  Component Value Date   LDLCALC 75 02/17/2023   Lab Results  Component Value Date   TRIG 68 02/17/2023   Lab Results  Component Value Date   CHOLHDL 3.3 02/17/2023   Lab Results  Component Value Date   HGBA1C 7.3 (H) 02/17/2023      Assessment & Plan:  Type 2 diabetes mellitus with hyperglycemia, without long-term current use of insulin (HCC) Assessment & Plan: The patient denies experiencing polyuria, polyphagia, or polydipsia. He has been taking metformin 1000 mg twice daily and has discontinued glimepiride 4 mg daily due to symptoms of tremors. The patient also reported not monitoring his blood sugar levels during episodes of tremors. I encourage him to decrease his intake of high-sugar foods and beverages and to increase his physical activity to help manage his condition. Lab Results  Component Value Date   HGBA1C 7.3 (H) 02/17/2023     Orders: -     Microalbumin / creatinine urine ratio  Essential hypertension Assessment & Plan: -Uncontrolled  -The patient reports elevated blood pressure today, attributing it to significant stress. He has been consuming a lot of processed foods and eating out frequently. 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 His current BMI is 47.45, though he has successfully lost 12 pounds since his last visit on Feb 24, 2023. -I congratulated the patient on his weight loss efforts and encouraged him to continue with his treatment regimen of amlodipine-olmesartan, 10-20 mg daily. The  patient should check his blood pressure once daily, repeat the measurement if it is greater than 140/90 mmHg, and document the second reading. He is advised to bring this blood pressure log to his next appointment. -Additionally, I recommend adhering to a low-sodium diet, increasing physical activity, and reviewing the cardiovascular risks associated with hypertension.  BP Readings from Last 3 Encounters:  05/26/23 (!) 140/82  02/24/23 (!) 142/84  02/07/23 138/84      Other hyperlipidemia Assessment & Plan: The patient is currently taking atorvastatin 10 mg daily and reports full compliance with the treatment regimen.  Recommendations:  Dietary Adjustments: Decrease intake of cholesterol, saturated fats, and trans fats. Increase consumption of fruits, vegetables, lean meats, and foods rich in omega-3 fatty acids. Physical Activity: Engage in regular physical activity to complement dietary changes and support overall cardiovascular health. Lab Results  Component Value Date   CHOL 128 02/17/2023   HDL 39 (L) 02/17/2023   LDLCALC 75 02/17/2023   TRIG 68 02/17/2023   CHOLHDL 3.3 02/17/2023     Orders: -     Lipid panel -     CMP14+EGFR -     CBC with Differential/Platelet  IFG (impaired fasting glucose) -     Hemoglobin A1c  Vitamin D deficiency -     VITAMIN D 25 Hydroxy (Vit-D Deficiency, Fractures)  Other specified hypothyroidism -     TSH + free T4  Encounter for immunization -     Flu vaccine trivalent PF, 6mos and older(Flulaval,Afluria,Fluarix,Fluzone)   Note: This chart has been completed using [employer] software, and while attempts have been made to ensure accuracy, certain words and phrases may not be transcribed as intended.   Follow-up: Return in about  1 month (around 06/26/2023) for BP.   Gilmore Laroche, FNP

## 2023-05-28 ENCOUNTER — Other Ambulatory Visit: Payer: Self-pay | Admitting: Family Medicine

## 2023-05-28 DIAGNOSIS — E1165 Type 2 diabetes mellitus with hyperglycemia: Secondary | ICD-10-CM

## 2023-05-28 MED ORDER — EMPAGLIFLOZIN 10 MG PO TABS: 10.0000 mg | ORAL_TABLET | Freq: Every day | ORAL | 1 refills | Status: DC

## 2023-05-29 LAB — LIPID PANEL
Chol/HDL Ratio: 3.8 ratio (ref 0.0–5.0)
Cholesterol, Total: 139 mg/dL (ref 100–199)
HDL: 37 mg/dL — ABNORMAL LOW (ref >39)
LDL Chol Calc (NIH): 86 mg/dL (ref 0–99)
Triglycerides: 81 mg/dL (ref 0–149)
VLDL Cholesterol Cal: 16 mg/dL (ref 5–40)

## 2023-05-29 LAB — CBC WITH DIFFERENTIAL/PLATELET
Basophils Absolute: 0 10*3/uL (ref 0.0–0.2)
Basos: 0 %
EOS (ABSOLUTE): 0.1 10*3/uL (ref 0.0–0.4)
Eos: 2 %
Hematocrit: 42.1 % (ref 37.5–51.0)
Hemoglobin: 13.7 g/dL (ref 13.0–17.7)
Immature Grans (Abs): 0 10*3/uL (ref 0.0–0.1)
Immature Granulocytes: 0 %
Lymphocytes Absolute: 1.3 10*3/uL (ref 0.7–3.1)
Lymphs: 21 %
MCH: 29.8 pg (ref 26.6–33.0)
MCHC: 32.5 g/dL (ref 31.5–35.7)
MCV: 92 fL (ref 79–97)
Monocytes Absolute: 0.6 10*3/uL (ref 0.1–0.9)
Monocytes: 10 %
Neutrophils Absolute: 4 10*3/uL (ref 1.4–7.0)
Neutrophils: 67 %
Platelets: 272 10*3/uL (ref 150–450)
RBC: 4.6 x10E6/uL (ref 4.14–5.80)
RDW: 12.5 % (ref 11.6–15.4)
WBC: 6.1 10*3/uL (ref 3.4–10.8)

## 2023-05-29 LAB — CMP14+EGFR
ALT: 14 IU/L (ref 0–44)
AST: 11 IU/L (ref 0–40)
Albumin: 4.1 g/dL (ref 3.8–4.9)
Alkaline Phosphatase: 96 IU/L (ref 44–121)
BUN/Creatinine Ratio: 10 (ref 9–20)
BUN: 11 mg/dL (ref 6–24)
Bilirubin Total: 0.6 mg/dL (ref 0.0–1.2)
CO2: 24 mmol/L (ref 20–29)
Calcium: 9.2 mg/dL (ref 8.7–10.2)
Chloride: 101 mmol/L (ref 96–106)
Creatinine, Ser: 1.15 mg/dL (ref 0.76–1.27)
Globulin, Total: 3 g/dL (ref 1.5–4.5)
Glucose: 198 mg/dL — ABNORMAL HIGH (ref 70–99)
Potassium: 4.4 mmol/L (ref 3.5–5.2)
Sodium: 140 mmol/L (ref 134–144)
Total Protein: 7.1 g/dL (ref 6.0–8.5)
eGFR: 73 mL/min/{1.73_m2} (ref >59)

## 2023-05-29 LAB — MICROALBUMIN / CREATININE URINE RATIO
Creatinine, Urine: 352.1 mg/dL
Microalb/Creat Ratio: 3 mg/g{creat} (ref 0–29)
Microalbumin, Urine: 9.4 ug/mL

## 2023-05-29 LAB — VITAMIN D 25 HYDROXY (VIT D DEFICIENCY, FRACTURES): Vit D, 25-Hydroxy: 29.5 ng/mL — ABNORMAL LOW (ref 30.0–100.0)

## 2023-05-29 LAB — HEMOGLOBIN A1C
Est. average glucose Bld gHb Est-mCnc: 206 mg/dL
Hgb A1c MFr Bld: 8.8 % — ABNORMAL HIGH (ref 4.8–5.6)

## 2023-05-29 LAB — TSH+FREE T4
Free T4: 1.28 ng/dL (ref 0.82–1.77)
TSH: 1.25 u[IU]/mL (ref 0.450–4.500)

## 2023-06-09 ENCOUNTER — Ambulatory Visit
Admission: RE | Admit: 2023-06-09 | Discharge: 2023-06-09 | Disposition: A | Discharge status: Home / Self Care | Payer: Managed Care, Other (non HMO) | Source: Ambulatory Visit | Attending: Orthopedic Surgery | Admitting: Orthopedic Surgery

## 2023-06-09 DIAGNOSIS — S46012A Strain of muscle(s) and tendon(s) of the rotator cuff of left shoulder, initial encounter: Secondary | ICD-10-CM

## 2023-06-09 MED ORDER — IOPAMIDOL (ISOVUE-M 200) INJECTION 41%
10.0000 mL | Freq: Once | INTRAMUSCULAR | Status: AC
  Administered 2023-06-09: 10 mL via INTRA_ARTICULAR

## 2023-06-14 ENCOUNTER — Encounter: Payer: Self-pay | Admitting: Orthopedic Surgery

## 2023-06-14 ENCOUNTER — Ambulatory Visit: Payer: Managed Care, Other (non HMO) | Admitting: Orthopedic Surgery

## 2023-06-14 DIAGNOSIS — S46012A Strain of muscle(s) and tendon(s) of the rotator cuff of left shoulder, initial encounter: Secondary | ICD-10-CM | POA: Diagnosis not present

## 2023-06-14 NOTE — Progress Notes (Unsigned)
Office Visit Note   Patient: Robert Mcintyre           Date of Birth: Oct 16, 1965           MRN: [id number] Visit Date: 06/14/2023 Requested by: Gilmore Laroche, FNP [id number] [place],  [place] [address] PCP: Gilmore Laroche, FNP  Subjective: Chief Complaint  Patient presents with   Other     Here to review left shoulder MRI    HPI: RASHEIM DAHMAN is a 57 y.o. male who presents to the office reporting left shoulder pain.  Patient is a Naval architect.  Really depends on where he holds his arm in position as to what kind of pain he has.  Does not do too much in terms of stressful activity with the left arm.  He states that he did hear a pop several months ago.  Since he was last seen has had an MRI scan which is reviewed.  Supraspinatus tear is present with retraction to the apex of the humeral head.  There is mild medial subluxation of the biceps tendon as well..                ROS: All systems reviewed are negative as they relate to the chief complaint within the history of present illness.  Patient denies fevers or chills.  Assessment & Plan: Visit Diagnoses:  1. Traumatic incomplete tear of left rotator cuff, initial encounter     Plan: Impression is left shoulder rotator cuff tear and biceps tendon pathology.  He is 57 and there is no muscle atrophy but the tear is borderline repairable.  I think [name]'s best bet would be surgical fixation of the rotator cuff tear possibly with patch augmentation depending on the amount of tension on the repair.  Biceps tenodesis also to be done.  The risk and benefits of that procedure are discussed.  He is going to consider his options and call if he wants to schedule.  Debbie's card is provided.  Follow-up as needed.  Time out of work would be on the order of 3 months in terms of physical work.  Follow-Up Instructions: No follow-ups on file.   Orders:  No orders of the defined types were placed in this encounter.  No orders of the defined types  were placed in this encounter.     Procedures: No procedures performed   Clinical Data: No additional findings.  Objective: Vital Signs: There were no vitals taken for this visit.  Physical Exam:  Constitutional: Patient appears well-developed HEENT:  Head: Normocephalic Eyes:EOM are normal Neck: Normal range of motion Cardiovascular: Normal rate Pulmonary/chest: Effort normal Neurologic: Patient is alert Skin: Skin is warm Psychiatric: Patient has normal mood and affect  Ortho Exam: Ortho examination unchanged from prior visit.  He does have fairly reasonable external rotation strength on the left and right-hand side at 5- out of 5 on the left 5+ out of 5 on the right.  Subscap strength is 5+ out of 5 bilaterally.  Does have mild tenderness of the biceps tendon in the bicipital groove but no discrete AC joint tenderness on the left.  No restriction of passive range of motion on either side.  Range of motion is approximately 60/105/165.  Does have a little bit of crepitus on the left compared to the right.  MS with internal and external rotation at 90 degrees of abduction.  Specialty Comments:  No specialty comments available.  Imaging: No results found.  PMFS History: Patient Active Problem List   Diagnosis Date Noted   Chronic left shoulder pain 02/07/2023   Musculoskeletal pain of upper extremity, left 10/12/2022   Vitamin D deficiency 06/29/2022   Seasonal allergies 06/29/2022   Encounter for routine adult physical exam with abnormal findings 04/22/2022   Fatigue 04/22/2022   Need for varicella vaccine 11/04/2021   Need for immunization against influenza 11/04/2021   Type 2 diabetes mellitus with hyperglycemia, without long-term current use of insulin (HCC) 11/04/2021   Chronic left-sided low back pain with left-sided sciatica 09/06/2021   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) 06/07/2021   Mass of skin of back 11/13/2020   Hyperlipidemia  10/16/2020   Insomnia, unspecified 10/16/2020   Pure hypercholesterolemia 04/24/2020   Poorly controlled diabetes mellitus (HCC) 01/25/2019   Prostate cancer (HCC) 04/09/2018   Essential hypertension 03/20/2017   Tubular adenoma of colon 09/20/2016   Encounter to establish care    Morbid obesity (HCC) 07/18/2016   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   Past Medical History:  Diagnosis Date   Arthritis    back (had surgery 98), knees   Asthma    uses albuterol during cold weather   Hypertension    Morbid obesity (HCC)    Periumbilical hernia    pt unaware   Prostate cancer (HCC)    prostate cancer around 2018, had surgery   Pure hypercholesterolemia 04/24/2020   Spondylolysis, lumbar region     Family History  Problem Relation Age of Onset   Heart disease Mother    Stroke Mother    Hypertension Mother    Diabetes Mother    Asthma Father    Hypertension Brother     Past Surgical History:  Procedure Laterality Date   COLONOSCOPY N/A 09/16/2016   Procedure: COLONOSCOPY;  Surgeon: West Bali, MD;  [facility];  Service: Endoscopy;  Laterality: N/A;  1:00 PM   LYMPHADENECTOMY Bilateral 04/09/2018   Procedure: LYMPHADENECTOMY;  Surgeon: Heloise Purpura, MD;  Location: [facility];  Service: Urology;  Laterality: Bilateral;   POLYPECTOMY  09/16/2016   Procedure: POLYPECTOMY;  Surgeon: West Bali, MD;  [facility];  Service: Endoscopy;;  ascending colon polyp, hepatic flexure polyp, descending colon polyp,    ROBOT ASSISTED LAPAROSCOPIC RADICAL PROSTATECTOMY N/A 04/09/2018   Procedure: XI ROBOTIC ASSISTED LAPAROSCOPIC RADICAL PROSTATECTOMY LEVEL 3;  Surgeon: Heloise Purpura, MD;  Location: [facility];  Service: Urology;  Laterality: N/A;  ONLY NEEDS 210 MIN FOR ALL PROCEDURES   SPINE SURGERY  05/1997   back fusion   Social History   Occupational History   Occupation: truck driver  Tobacco Use   Smoking status: Former    Current  packs/day: 0.00    Average packs/day: 0.3 packs/day for 3.0 years (0.8 ttl pk-yrs)    Types: Cigarettes    Start date: 54    Quit date: 1980    Years since quitting: 44.7   Smokeless tobacco: Never  Vaping Use   Vaping status: Never Used  Substance and Sexual Activity   Alcohol use: Not Currently    Comment: occasional; beer and some liquor; 6 pack last several weeks and moonshine lasts months   Drug use: No   Sexual activity: Not Currently

## 2023-06-22 ENCOUNTER — Telehealth: Payer: Self-pay | Admitting: Orthopedic Surgery

## 2023-06-22 NOTE — Telephone Encounter (Signed)
Patient was told to call when ready to schedule left shoulder surgery with Dr. August Saucer.  Please provide surgery order.

## 2023-06-23 NOTE — Telephone Encounter (Signed)
Done thx

## 2023-06-27 ENCOUNTER — Other Ambulatory Visit: Payer: Self-pay | Admitting: Family Medicine

## 2023-06-27 DIAGNOSIS — G4700 Insomnia, unspecified: Secondary | ICD-10-CM

## 2023-06-27 MED ORDER — QUETIAPINE FUMARATE 50 MG PO TABS
50.0000 mg | ORAL_TABLET | Freq: Every day | ORAL | 0 refills | Status: DC
Start: 2023-06-27 — End: 2023-09-14

## 2023-06-30 ENCOUNTER — Ambulatory Visit: Payer: Managed Care, Other (non HMO) | Admitting: Family Medicine

## 2023-07-13 ENCOUNTER — Encounter (HOSPITAL_BASED_OUTPATIENT_CLINIC_OR_DEPARTMENT_OTHER): Payer: Self-pay

## 2023-07-13 ENCOUNTER — Telehealth: Payer: Self-pay | Admitting: Orthopedic Surgery

## 2023-07-13 NOTE — Pre-Procedure Instructions (Signed)
Surgical Instructions   Your procedure is scheduled on July 20, 2023. Report to [facility] Main Entrance "A" at 1:30 P.M., then check in with the Admitting office. Any questions or running late day of surgery: call [phone]  Questions prior to your surgery date: call [phone], Monday-Friday, 8am-4pm. If you experience any cold or flu symptoms such as cough, fever, chills, shortness of breath, etc. between now and your scheduled surgery, please notify us at the above number.     Remember:  Do not eat after midnight the night before your surgery   You may drink clear liquids until 12:30 PM the afternoon of your surgery.   Clear liquids allowed are: Water, Non-Citrus Juices (without pulp), Carbonated Beverages, Clear Tea, Black Coffee Only (NO MILK, CREAM OR POWDERED CREAMER of any kind), and Gatorade.  Patient Instructions  The night before surgery:  No food after midnight. ONLY clear liquids after midnight  The day of surgery (if you have diabetes): Drink ONE (1) 12 oz G2 given to you in your pre admission testing appointment by 12:30 PM the afternoon of surgery. Drink in one sitting. Do not sip.  This drink was given to you during your hospital  pre-op appointment visit.  Nothing else to drink after completing the  12 oz bottle of G2.         If you have questions, please contact your surgeon's office.     Take these medicines the morning of surgery with A SIP OF WATER: atorvastatin (LIPITOR)    May take these medicines IF NEEDED: albuterol (PROAIR HFA) inhaler    One week prior to surgery, STOP taking any Aspirin (unless otherwise instructed by your surgeon) Aleve, Naproxen, Ibuprofen, Motrin, Advil, Goody's, BC's, all herbal medications, fish oil, and non-prescription vitamins.   WHAT DO I DO ABOUT MY DIABETES MEDICATION?   Do not take metFORMIN (GLUCOPHAGE) the morning of surgery.  STOP taking your empagliflozin (JARDIANCE) three days prior to surgery.  Your last dose will be October 20th.       HOW TO MANAGE YOUR DIABETES BEFORE AND AFTER SURGERY  Why is it important to control my blood sugar before and after surgery? Improving blood sugar levels before and after surgery helps healing and can limit problems. A way of improving blood sugar control is eating a healthy diet by:  Eating less sugar and carbohydrates  Increasing activity/exercise  Talking with your doctor about reaching your blood sugar goals High blood sugars (greater than 180 mg/dL) can raise your risk of infections and slow your recovery, so you will need to focus on controlling your diabetes during the weeks before surgery. Make sure that the doctor who takes care of your diabetes knows about your planned surgery including the date and location.  How do I manage my blood sugar before surgery? Check your blood sugar at least 4 times a day, starting 2 days before surgery, to make sure that the level is not too high or low.  Check your blood sugar the morning of your surgery when you wake up and every 2 hours until you get to the Short Stay unit.  If your blood sugar is less than 70 mg/dL, you will need to treat for low blood sugar: Do not take insulin. Treat a low blood sugar (less than 70 mg/dL) with  cup of clear juice (cranberry or apple), 4 glucose tablets, OR glucose gel. Recheck blood sugar in 15 minutes after treatment (to make sure it is greater than 70  mg/dL). If your blood sugar is not greater than 70 mg/dL on recheck, call [phone] for further instructions. Report your blood sugar to the short stay nurse when you get to Short Stay.  If you are admitted to the hospital after surgery: Your blood sugar will be checked by the staff and you will probably be given insulin after surgery (instead of oral diabetes medicines) to make sure you have good blood sugar levels. The goal for blood sugar control after surgery is 80-180 mg/dL.                      Do NOT  Smoke (Tobacco/Vaping) for 24 hours prior to your procedure.  If you use a CPAP at night, you may bring your mask/headgear for your overnight stay.   You will be asked to remove any contacts, glasses, piercing's, hearing aid's, dentures/partials prior to surgery. Please bring cases for these items if needed.    Patients discharged the day of surgery will not be allowed to drive home, and someone needs to stay with them for 24 hours.  SURGICAL WAITING ROOM VISITATION Patients may have no more than 2 support people in the waiting area - these visitors may rotate.   Pre-op nurse will coordinate an appropriate time for 1 ADULT support person, who may not rotate, to accompany patient in pre-op.  Children under the age of 65 must have an adult with them who is not the patient and must remain in the main waiting area with an adult.  If the patient needs to stay at the hospital during part of their recovery, the visitor guidelines for inpatient rooms apply.  Please refer to the [facility] website for the visitor guidelines for any additional information.   If you received a COVID test during your pre-op visit  it is requested that you wear a mask when out in public, stay away from anyone that may not be feeling well and notify your surgeon if you develop symptoms. If you have been in contact with anyone that has tested positive in the last 10 days please notify you surgeon.      Pre-operative CHG Bathing Instructions   You can play a key role in reducing the risk of infection after surgery. Your skin needs to be as free of germs as possible. You can reduce the number of germs on your skin by washing with CHG (chlorhexidine gluconate) soap before surgery. CHG is an antiseptic soap that kills germs and continues to kill germs even after washing.   DO NOT use if you have an allergy to chlorhexidine/CHG or antibacterial soaps. If your skin becomes reddened or irritated, stop using the CHG and notify  one of our RNs at [phone].              TAKE A SHOWER THE NIGHT BEFORE SURGERY AND THE DAY OF SURGERY    Please keep in mind the following:  DO NOT shave, including legs and underarms, 48 hours prior to surgery.   You may shave your face before/day of surgery.  Place clean sheets on your bed the night before surgery Use a clean washcloth (not used since being washed) for each shower. DO NOT sleep with pet's night before surgery.  CHG Shower Instructions:  Wash your face and private area with normal soap. If you choose to wash your hair, wash first with your normal shampoo.  After you use shampoo/soap, rinse your hair and body thoroughly to remove shampoo/soap  residue.  Turn the water OFF and apply half the bottle of CHG soap to a CLEAN washcloth.  Apply CHG soap ONLY FROM YOUR NECK DOWN TO YOUR TOES (washing for 3-5 minutes)  DO NOT use CHG soap on face, private areas, open wounds, or sores.  Pay special attention to the area where your surgery is being performed.  If you are having back surgery, having someone wash your back for you may be helpful. Wait 2 minutes after CHG soap is applied, then you may rinse off the CHG soap.  Pat dry with a clean towel  Put on clean pajamas    Additional instructions for the day of surgery: DO NOT APPLY any lotions, deodorants, cologne, or perfumes.   Do not wear jewelry or makeup Do not wear nail polish, gel polish, artificial nails, or any other type of covering on natural nails (fingers and toes) Do not bring valuables to the hospital. [facility] is not responsible for valuables/personal belongings. Put on clean/comfortable clothes.  Please brush your teeth.  Ask your nurse before applying any prescription medications to the skin.   [place]- Preparing for Total Shoulder Arthroplasty  Before surgery, you can play an important role. Because skin is not sterile, your skin needs to be as free of germs as possible. You can reduce the  number of germs on your skin by using the following products.   Benzoyl Peroxide Gel  o Reduces the number of germs present on the skin  o Applied twice a day to shoulder area starting two days before surgery   Chlorhexidine Gluconate (CHG) Soap (instructions listed above on how to wash with CHG Soap)  o An antiseptic cleaner that kills germs and bonds with the skin to continue killing germs even after washing  o Used for showering the night before surgery and morning of surgery   ==================================================================  Please follow these instructions carefully:  BENZOYL PEROXIDE 5% GEL  Please do not use if you have an allergy to benzoyl peroxide. If your skin becomes reddened/irritated stop using the benzoyl peroxide.  Starting two days before surgery, apply as follows:  1. Apply benzoyl peroxide in the morning and at night. Apply after taking a shower. If you are not taking a shower clean entire shoulder front, back, and side along with the armpit with a clean wet washcloth.  2. Place a quarter-sized dollop on your SHOULDER and rub in thoroughly, making sure to cover the front, back, and side of your shoulder, along with the armpit.   2 Days prior to Surgery First Dose on _____________ Morning Second Dose on ______________ Night  Day Before Surgery First Dose on ______________ Morning Night before surgery wash (entire body except face and private areas) with CHG Soap THEN Second Dose on ____________ Night   Morning of Surgery  wash BODY AGAIN with CHG Soap   4. Do NOT apply benzoyl peroxide gel on the day of surgery   Please read over the following fact sheets that you were given.

## 2023-07-13 NOTE — Telephone Encounter (Signed)
Note completed and in chart

## 2023-07-13 NOTE — Telephone Encounter (Signed)
Pt called. Can you please print out a copy for pt to pick up tomorrow

## 2023-07-13 NOTE — Telephone Encounter (Signed)
Out of work 2-3 months most likely

## 2023-07-13 NOTE — Telephone Encounter (Signed)
Pt job is asking for a note for work stating what he is having done surgery wise he is scheduled for next Thursday and when he would roughly be able to return back to work, pt may call back with Fax number for [place] to send it out

## 2023-07-14 ENCOUNTER — Ambulatory Visit: Payer: Managed Care, Other (non HMO) | Admitting: Family Medicine

## 2023-07-14 ENCOUNTER — Encounter (HOSPITAL_COMMUNITY)
Admission: RE | Admit: 2023-07-14 | Discharge: 2023-07-14 | Disposition: A | Discharge status: Home / Self Care | Payer: Managed Care, Other (non HMO) | Source: Ambulatory Visit | Attending: Orthopedic Surgery | Admitting: Orthopedic Surgery

## 2023-07-14 ENCOUNTER — Other Ambulatory Visit: Payer: Self-pay

## 2023-07-14 ENCOUNTER — Encounter (HOSPITAL_COMMUNITY): Payer: Self-pay

## 2023-07-14 DIAGNOSIS — Z01812 Encounter for preprocedural laboratory examination: Secondary | ICD-10-CM | POA: Insufficient documentation

## 2023-07-14 DIAGNOSIS — Z01818 Encounter for other preprocedural examination: Secondary | ICD-10-CM

## 2023-07-14 HISTORY — DX: Type 2 diabetes mellitus without complications: E11.9

## 2023-07-14 HISTORY — DX: Pneumonia, unspecified organism: J18.9

## 2023-07-14 LAB — BASIC METABOLIC PANEL
Anion gap: 11 (ref 5–15)
BUN: 11 mg/dL (ref 6–20)
CO2: 25 mmol/L (ref 22–32)
Calcium: 9.6 mg/dL (ref 8.9–10.3)
Chloride: 103 mmol/L (ref 98–111)
Creatinine, Ser: 1.15 mg/dL (ref 0.61–1.24)
GFR, Estimated: >60 mL/min (ref >60)
Glucose, Bld: 133 mg/dL — ABNORMAL HIGH (ref 70–99)
Potassium: 3.8 mmol/L (ref 3.5–5.1)
Sodium: 139 mmol/L (ref 135–145)

## 2023-07-14 LAB — CBC
HCT: 42.4 % (ref 39.0–52.0)
Hemoglobin: 13.5 g/dL (ref 13.0–17.0)
MCH: 29.2 pg (ref 26.0–34.0)
MCHC: 31.8 g/dL (ref 30.0–36.0)
MCV: 91.6 fL (ref 80.0–100.0)
Platelets: 293 10*3/uL (ref 150–400)
RBC: 4.63 MIL/uL (ref 4.22–5.81)
RDW: 13.3 % (ref 11.5–15.5)
WBC: 9.2 10*3/uL (ref 4.0–10.5)
nRBC: 0 % (ref 0.0–0.2)

## 2023-07-14 LAB — GLUCOSE, CAPILLARY: Glucose-Capillary: 163 mg/dL — ABNORMAL HIGH (ref 70–99)

## 2023-07-14 NOTE — Progress Notes (Addendum)
PCP - Gilmore Laroche, FNP Cardiologist - Denies  PPM/ICD - Denies Device Orders - n/a Rep Notified - n/a  Chest x-ray - n/a EKG - 10/12/2022 Stress Test - Denies ECHO - Denies Cardiac Cath - Denies  Sleep Study - Denies. Stopbang score 5 CPAP - n/a  Pt is DM2. He does have a glucose meter, but has not checked his blood sugar in a long time. CBG at pre-op 163. Last A1c 8.8 on 05/26/2023. Pt educated to start checking his fasting blood sugar every morning when he wakes up prior to eating or drinking and more frequently if needed.  Last dose of GLP1 agonist- n/a GLP1 instructions: n/a  Blood Thinner Instructions: n/a Aspirin Instructions: n/a  ERAS Protcol - Clear liquids until 1230 DOS PRE-SURGERY Ensure or G2- G2 given to pt with instructions  COVID TEST- n/a   Anesthesia review: No. A1c result discussed with Antionette Poles, PA-C and does not need to be redrawn at this time.  Patient denies shortness of breath, fever, cough and chest pain at PAT appointment. Pt denies any respiratory illness/infection in the last two months.    All instructions explained to the patient, with a verbal understanding of the material. Patient agrees to go over the instructions while at home for a better understanding. Patient also instructed to self quarantine after being tested for COVID-19. The opportunity to ask questions was provided.

## 2023-07-14 NOTE — Plan of Care (Signed)
CHL Tonsillectomy/Adenoidectomy, Postoperative PEDS care plan entered in error.

## 2023-07-20 ENCOUNTER — Ambulatory Visit (HOSPITAL_COMMUNITY)
Admission: RE | Admit: 2023-07-20 | Discharge: 2023-07-20 | Disposition: A | Discharge status: Home / Self Care | Payer: Managed Care, Other (non HMO) | Attending: Orthopedic Surgery | Admitting: Orthopedic Surgery

## 2023-07-20 ENCOUNTER — Ambulatory Visit (HOSPITAL_COMMUNITY): Payer: Managed Care, Other (non HMO) | Admitting: Physician Assistant

## 2023-07-20 ENCOUNTER — Other Ambulatory Visit: Payer: Self-pay

## 2023-07-20 ENCOUNTER — Ambulatory Visit (HOSPITAL_COMMUNITY): Payer: Managed Care, Other (non HMO) | Admitting: Anesthesiology

## 2023-07-20 ENCOUNTER — Encounter (HOSPITAL_COMMUNITY)
Admission: RE | Disposition: A | Discharge status: Home / Self Care | Payer: Self-pay | Source: Home / Self Care | Attending: Orthopedic Surgery

## 2023-07-20 ENCOUNTER — Encounter (HOSPITAL_COMMUNITY): Payer: Self-pay | Admitting: Orthopedic Surgery

## 2023-07-20 DIAGNOSIS — Z87891 Personal history of nicotine dependence: Secondary | ICD-10-CM | POA: Insufficient documentation

## 2023-07-20 DIAGNOSIS — Z7984 Long term (current) use of oral hypoglycemic drugs: Secondary | ICD-10-CM | POA: Insufficient documentation

## 2023-07-20 DIAGNOSIS — Z01818 Encounter for other preprocedural examination: Secondary | ICD-10-CM

## 2023-07-20 DIAGNOSIS — M75102 Unspecified rotator cuff tear or rupture of left shoulder, not specified as traumatic: Secondary | ICD-10-CM | POA: Diagnosis present

## 2023-07-20 DIAGNOSIS — S43432A Superior glenoid labrum lesion of left shoulder, initial encounter: Secondary | ICD-10-CM | POA: Insufficient documentation

## 2023-07-20 DIAGNOSIS — Z8546 Personal history of malignant neoplasm of prostate: Secondary | ICD-10-CM | POA: Diagnosis not present

## 2023-07-20 DIAGNOSIS — Z6841 Body Mass Index (BMI) 40.0 and over, adult: Secondary | ICD-10-CM | POA: Insufficient documentation

## 2023-07-20 DIAGNOSIS — M65912 Unspecified synovitis and tenosynovitis, left shoulder: Secondary | ICD-10-CM

## 2023-07-20 DIAGNOSIS — M7522 Bicipital tendinitis, left shoulder: Secondary | ICD-10-CM

## 2023-07-20 DIAGNOSIS — X58XXXA Exposure to other specified factors, initial encounter: Secondary | ICD-10-CM | POA: Insufficient documentation

## 2023-07-20 DIAGNOSIS — E119 Type 2 diabetes mellitus without complications: Secondary | ICD-10-CM

## 2023-07-20 DIAGNOSIS — I10 Essential (primary) hypertension: Secondary | ICD-10-CM | POA: Diagnosis not present

## 2023-07-20 DIAGNOSIS — M75122 Complete rotator cuff tear or rupture of left shoulder, not specified as traumatic: Secondary | ICD-10-CM | POA: Diagnosis not present

## 2023-07-20 HISTORY — PX: SHOULDER ARTHROSCOPY WITH SUBACROMIAL DECOMPRESSION, ROTATOR CUFF REPAIR AND BICEP TENDON REPAIR: SHX5687

## 2023-07-20 LAB — GLUCOSE, CAPILLARY
Glucose-Capillary: 83 mg/dL (ref 70–99)
Glucose-Capillary: 103 mg/dL — ABNORMAL HIGH (ref 70–99)
Glucose-Capillary: 159 mg/dL — ABNORMAL HIGH (ref 70–99)

## 2023-07-20 SURGERY — SHOULDER ARTHROSCOPY WITH SUBACROMIAL DECOMPRESSION, ROTATOR CUFF REPAIR AND BICEP TENDON REPAIR
Anesthesia: General | Site: Shoulder | Laterality: Left

## 2023-07-20 MED ORDER — ACETAMINOPHEN 500 MG PO TABS: 1000.0000 mg | ORAL_TABLET | Freq: Once | ORAL | Status: DC | PRN

## 2023-07-20 MED ORDER — LACTATED RINGERS IV SOLN
INTRAVENOUS | Status: DC
Start: 2023-07-20 — End: 2023-07-21

## 2023-07-20 MED ORDER — DEXMEDETOMIDINE HCL IN NACL 80 MCG/20ML IV SOLN
INTRAVENOUS | Status: DC | PRN
Start: 2023-07-20 — End: 2023-07-20
  Administered 2023-07-20: 8 ug via INTRAVENOUS

## 2023-07-20 MED ORDER — CEFAZOLIN IN SODIUM CHLORIDE 3-0.9 GM/100ML-% IV SOLN
INTRAVENOUS | Status: AC
  Filled 2023-07-20: qty 100

## 2023-07-20 MED ORDER — ACETAMINOPHEN 10 MG/ML IV SOLN: 1000.0000 mg | Freq: Once | INTRAVENOUS | Status: DC | PRN

## 2023-07-20 MED ORDER — OXYCODONE HCL 5 MG/5ML PO SOLN: 5.0000 mg | Freq: Once | ORAL | Status: DC | PRN

## 2023-07-20 MED ORDER — ROCURONIUM BROMIDE 10 MG/ML (PF) SYRINGE
PREFILLED_SYRINGE | INTRAVENOUS | Status: AC
  Filled 2023-07-20: qty 20

## 2023-07-20 MED ORDER — PHENYLEPHRINE HCL-NACL 20-0.9 MG/250ML-% IV SOLN
INTRAVENOUS | Status: DC | PRN
  Administered 2023-07-20: 50 ug/min via INTRAVENOUS

## 2023-07-20 MED ORDER — DEXAMETHASONE SODIUM PHOSPHATE 10 MG/ML IJ SOLN
INTRAMUSCULAR | Status: DC | PRN
  Administered 2023-07-20: 10 mg via INTRAVENOUS

## 2023-07-20 MED ORDER — SODIUM CHLORIDE (PF) 0.9 % IJ SOLN
INTRAMUSCULAR | Status: AC
  Filled 2023-07-20: qty 20

## 2023-07-20 MED ORDER — OXYCODONE HCL 5 MG PO TABS: 5.0000 mg | ORAL_TABLET | ORAL | 0 refills | Status: DC | PRN

## 2023-07-20 MED ORDER — PROPOFOL 10 MG/ML IV BOLUS
INTRAVENOUS | Status: AC
  Filled 2023-07-20: qty 20

## 2023-07-20 MED ORDER — FENTANYL CITRATE (PF) 100 MCG/2ML IJ SOLN
INTRAMUSCULAR | Status: AC
  Filled 2023-07-20: qty 2

## 2023-07-20 MED ORDER — CELECOXIB 100 MG PO CAPS: 100.0000 mg | ORAL_CAPSULE | Freq: Two times a day (BID) | ORAL | 0 refills | Status: DC

## 2023-07-20 MED ORDER — BUPIVACAINE LIPOSOME 1.3 % IJ SUSP
INTRAMUSCULAR | Status: DC | PRN
  Administered 2023-07-20: 10 mL via PERINEURAL

## 2023-07-20 MED ORDER — EPHEDRINE 5 MG/ML INJ
INTRAVENOUS | Status: AC
Start: 2023-07-20 — End: ?
  Filled 2023-07-20: qty 5

## 2023-07-20 MED ORDER — EPHEDRINE 5 MG/ML INJ
INTRAVENOUS | Status: AC
  Filled 2023-07-20: qty 5

## 2023-07-20 MED ORDER — VANCOMYCIN HCL 1000 MG IV SOLR
INTRAVENOUS | Status: AC
  Filled 2023-07-20: qty 20

## 2023-07-20 MED ORDER — KETOROLAC TROMETHAMINE 30 MG/ML IJ SOLN
INTRAMUSCULAR | Status: AC
Start: 2023-07-20 — End: ?
  Filled 2023-07-20: qty 1

## 2023-07-20 MED ORDER — KETAMINE HCL 50 MG/5ML IJ SOSY
PREFILLED_SYRINGE | INTRAMUSCULAR | Status: AC
  Filled 2023-07-20: qty 5

## 2023-07-20 MED ORDER — INSULIN ASPART 100 UNIT/ML IJ SOLN: 0.0000 [IU] | INTRAMUSCULAR | Status: DC | PRN

## 2023-07-20 MED ORDER — PHENYLEPHRINE 80 MCG/ML (10ML) SYRINGE FOR IV PUSH (FOR BLOOD PRESSURE SUPPORT)
PREFILLED_SYRINGE | INTRAVENOUS | Status: AC
  Filled 2023-07-20: qty 10

## 2023-07-20 MED ORDER — DEXAMETHASONE SODIUM PHOSPHATE 10 MG/ML IJ SOLN
INTRAMUSCULAR | Status: AC
  Filled 2023-07-20: qty 1

## 2023-07-20 MED ORDER — OXYCODONE HCL 5 MG PO TABS: 5.0000 mg | ORAL_TABLET | Freq: Once | ORAL | Status: DC | PRN

## 2023-07-20 MED ORDER — ESMOLOL HCL 100 MG/10ML IV SOLN
INTRAVENOUS | Status: AC
  Filled 2023-07-20: qty 10

## 2023-07-20 MED ORDER — SODIUM CHLORIDE 0.9 % IR SOLN
Status: DC | PRN
  Administered 2023-07-20: 3000 mL

## 2023-07-20 MED ORDER — PROPOFOL 10 MG/ML IV BOLUS
INTRAVENOUS | Status: DC | PRN
  Administered 2023-07-20: 160 mg via INTRAVENOUS

## 2023-07-20 MED ORDER — ONDANSETRON HCL 4 MG/2ML IJ SOLN
INTRAMUSCULAR | Status: AC
  Filled 2023-07-20: qty 2

## 2023-07-20 MED ORDER — FENTANYL CITRATE (PF) 100 MCG/2ML IJ SOLN: 25.0000 ug | INTRAMUSCULAR | Status: DC | PRN

## 2023-07-20 MED ORDER — MIDAZOLAM HCL 2 MG/2ML IJ SOLN
INTRAMUSCULAR | Status: DC | PRN
Start: 2023-07-20 — End: 2023-07-20
  Administered 2023-07-20 (×2): 1 mg via INTRAVENOUS

## 2023-07-20 MED ORDER — CHLORHEXIDINE GLUCONATE 0.12 % MT SOLN
OROMUCOSAL | Status: AC
  Administered 2023-07-20: 15 mL via OROMUCOSAL
  Filled 2023-07-20: qty 15

## 2023-07-20 MED ORDER — MIDAZOLAM HCL 2 MG/2ML IJ SOLN
INTRAMUSCULAR | Status: AC
  Filled 2023-07-20: qty 2

## 2023-07-20 MED ORDER — ACETAMINOPHEN 160 MG/5ML PO SOLN: 1000.0000 mg | Freq: Once | ORAL | Status: DC | PRN

## 2023-07-20 MED ORDER — ONDANSETRON HCL 4 MG/2ML IJ SOLN
INTRAMUSCULAR | Status: DC | PRN
  Administered 2023-07-20: 4 mg via INTRAVENOUS

## 2023-07-20 MED ORDER — ROCURONIUM BROMIDE 10 MG/ML (PF) SYRINGE
PREFILLED_SYRINGE | INTRAVENOUS | Status: DC | PRN
  Administered 2023-07-20: 50 mg via INTRAVENOUS
  Administered 2023-07-20: 80 mg via INTRAVENOUS

## 2023-07-20 MED ORDER — ORAL CARE MOUTH RINSE: 15.0000 mL | Freq: Once | OROMUCOSAL | Status: AC

## 2023-07-20 MED ORDER — TRANEXAMIC ACID-NACL 1000-0.7 MG/100ML-% IV SOLN
1000.0000 mg | INTRAVENOUS | Status: AC
  Administered 2023-07-20: 1000 mg via INTRAVENOUS
  Filled 2023-07-20: qty 100

## 2023-07-20 MED ORDER — CEFAZOLIN IN SODIUM CHLORIDE 3-0.9 GM/100ML-% IV SOLN
3.0000 g | INTRAVENOUS | Status: AC
  Administered 2023-07-20: 3 g via INTRAVENOUS

## 2023-07-20 MED ORDER — METHOCARBAMOL 500 MG PO TABS: 500.0000 mg | ORAL_TABLET | Freq: Three times a day (TID) | ORAL | 1 refills | Status: DC | PRN

## 2023-07-20 MED ORDER — FENTANYL CITRATE (PF) 100 MCG/2ML IJ SOLN
INTRAMUSCULAR | Status: DC | PRN
  Administered 2023-07-20 (×2): 50 ug via INTRAVENOUS

## 2023-07-20 MED ORDER — LIDOCAINE 2% (20 MG/ML) 5 ML SYRINGE
INTRAMUSCULAR | Status: AC
  Filled 2023-07-20: qty 5

## 2023-07-20 MED ORDER — BUPIVACAINE-EPINEPHRINE (PF) 0.5% -1:200000 IJ SOLN
INTRAMUSCULAR | Status: DC | PRN
  Administered 2023-07-20: 15 mL via PERINEURAL

## 2023-07-20 MED ORDER — SODIUM CHLORIDE 0.9 % IR SOLN
Status: DC | PRN
  Administered 2023-07-20: 1000 mL

## 2023-07-20 MED ORDER — CHLORHEXIDINE GLUCONATE 0.12 % MT SOLN: 15.0000 mL | Freq: Once | OROMUCOSAL | Status: AC

## 2023-07-20 MED ORDER — SUGAMMADEX SODIUM 200 MG/2ML IV SOLN
INTRAVENOUS | Status: DC | PRN
  Administered 2023-07-20: 300 mg via INTRAVENOUS

## 2023-07-20 MED ORDER — LABETALOL HCL 5 MG/ML IV SOLN
INTRAVENOUS | Status: AC
  Filled 2023-07-20: qty 4

## 2023-07-20 MED ORDER — ROCURONIUM BROMIDE 10 MG/ML (PF) SYRINGE
PREFILLED_SYRINGE | INTRAVENOUS | Status: AC
  Filled 2023-07-20: qty 10

## 2023-07-20 MED ORDER — ACETAMINOPHEN 500 MG PO TABS
1000.0000 mg | ORAL_TABLET | Freq: Once | ORAL | Status: AC
  Administered 2023-07-20: 1000 mg via ORAL
  Filled 2023-07-20: qty 2

## 2023-07-20 SURGICAL SUPPLY — 72 items
AID PSTN UNV HD RSTRNT DISP (MISCELLANEOUS) ×1
ANCH SUT 2 FBRTK KNTLS 1.8 (Anchor) ×1 IMPLANT
ANCH SUT 2 SWLK 19.1 CLS EYLT (Anchor) ×3 IMPLANT
ANCH SUT FBRTK 1.3 2 TPE (Anchor) ×2 IMPLANT
ANCHOR FBRTK 2.6 SUTURETAP 1.3 (Anchor) IMPLANT
ANCHOR SUT 1.8 FIBERTAK SB KL (Anchor) IMPLANT
ANCHOR SWIVELOCK BIO 4.75X19.1 (Anchor) IMPLANT
BAG COUNTER SPONGE SURGICOUNT (BAG) IMPLANT
BAG SPNG CNTER NS LX DISP (BAG)
BLADE EXCALIBUR 4.0X13 (MISCELLANEOUS) IMPLANT
BURR OVAL 8 FLU 4.0X13 (MISCELLANEOUS) IMPLANT
COVER SURGICAL LIGHT HANDLE (MISCELLANEOUS) ×2 IMPLANT
DRAPE INCISE IOBAN 66X45 STRL (DRAPES) ×4 IMPLANT
DRAPE STERI 35X30 U-POUCH (DRAPES) ×2 IMPLANT
DRAPE U-SHAPE 47X51 STRL (DRAPES) ×4 IMPLANT
DRSG TEGADERM 4X4.75 (GAUZE/BANDAGES/DRESSINGS) ×6 IMPLANT
DURAPREP 26ML APPLICATOR (WOUND CARE) ×2 IMPLANT
DW OUTFLOW CASSETTE/TUBE SET (MISCELLANEOUS) ×2 IMPLANT
ELECT REM PT RETURN 9FT ADLT (ELECTROSURGICAL) ×1
ELECTRODE REM PT RTRN 9FT ADLT (ELECTROSURGICAL) ×2 IMPLANT
GAUZE SPONGE 4X4 12PLY STRL (GAUZE/BANDAGES/DRESSINGS) ×2 IMPLANT
GAUZE SPONGE 4X4 12PLY STRL LF (GAUZE/BANDAGES/DRESSINGS) ×2 IMPLANT
GAUZE XEROFORM 1X8 LF (GAUZE/BANDAGES/DRESSINGS) IMPLANT
GLOVE BIOGEL PI IND STRL 7.0 (GLOVE) ×2 IMPLANT
GLOVE BIOGEL PI IND STRL 8 (GLOVE) ×2 IMPLANT
GLOVE ECLIPSE 7.0 STRL STRAW (GLOVE) ×2 IMPLANT
GLOVE ECLIPSE 8.0 STRL XLNG CF (GLOVE) ×2 IMPLANT
GOWN STRL REUS W/ TWL LRG LVL3 (GOWN DISPOSABLE) ×6 IMPLANT
GOWN STRL REUS W/TWL LRG LVL3 (GOWN DISPOSABLE) ×2
KIT BASIN OR (CUSTOM PROCEDURE TRAY) ×2 IMPLANT
KIT STR SPEAR 1.8 FBRTK DISP (KITS) IMPLANT
KIT TURNOVER KIT B (KITS) ×2 IMPLANT
MANIFOLD NEPTUNE II (INSTRUMENTS) ×2 IMPLANT
NDL SCORPION MULTI FIRE (NEEDLE) IMPLANT
NDL SPNL 18GX3.5 QUINCKE PK (NEEDLE) ×2 IMPLANT
NDL SUT 6 .5 CRC .975X.05 MAYO (NEEDLE) IMPLANT
NEEDLE MAYO TAPER (NEEDLE)
NEEDLE SCORPION MULTI FIRE (NEEDLE) ×1 IMPLANT
NEEDLE SPNL 18GX3.5 QUINCKE PK (NEEDLE) ×1 IMPLANT
NS IRRIG 1000ML POUR BTL (IV SOLUTION) ×2 IMPLANT
PACK SHOULDER (CUSTOM PROCEDURE TRAY) ×2 IMPLANT
PAD ARMBOARD 7.5X6 YLW CONV (MISCELLANEOUS) ×4 IMPLANT
PAD COLD SHLDR WRAP-ON (PAD) IMPLANT
PROBE APOLLO 90XL (SURGICAL WAND) ×2 IMPLANT
RESTRAINT HEAD UNIVERSAL NS (MISCELLANEOUS) ×2 IMPLANT
SLING ARM IMMOBILIZER LRG (SOFTGOODS) IMPLANT
SLING ARM IMMOBILIZER XL (CAST SUPPLIES) IMPLANT
SPONGE T-LAP 18X18 ~~LOC~~+RFID (SPONGE) IMPLANT
STRIP CLOSURE SKIN 1/2X4 (GAUZE/BANDAGES/DRESSINGS) ×2 IMPLANT
SUCTION TUBE FRAZIER 10FR DISP (SUCTIONS) ×2 IMPLANT
SUT 0 FIBERLOOP 38 BLUE TPR ND (SUTURE) ×2
SUT ETHILON 3 0 PS 1 (SUTURE) ×2 IMPLANT
SUT FIBERWIRE #2 38 T-5 BLUE (SUTURE)
SUT MNCRL AB 3-0 PS2 18 (SUTURE) ×2 IMPLANT
SUT VIC AB 0 CT1 27 (SUTURE) ×1
SUT VIC AB 0 CT1 27XBRD ANBCTR (SUTURE) ×2 IMPLANT
SUT VIC AB 1 CT1 27 (SUTURE) ×1
SUT VIC AB 1 CT1 27XBRD ANBCTR (SUTURE) IMPLANT
SUT VIC AB 2-0 CT1 27 (SUTURE) ×1
SUT VIC AB 2-0 CT1 TAPERPNT 27 (SUTURE) ×2 IMPLANT
SUT VICRYL 0 UR6 27IN ABS (SUTURE) ×2 IMPLANT
SUT VICRYL 1 TIES 12X18 (SUTURE) ×2 IMPLANT
SUTURE 0 FIBERLP 38 BLU TPR ND (SUTURE) IMPLANT
SUTURE FIBERWR #2 38 T-5 BLUE (SUTURE) IMPLANT
SUTURE TAPE 1.3 FIBERLOP 20 ST (SUTURE) IMPLANT
SUTURETAPE 1.3 FIBERLOOP 20 ST (SUTURE)
SYS FBRTK BUTTON 2.6 (Anchor) ×1 IMPLANT
SYSTEM FBRTK BUTTON 2.6 (Anchor) IMPLANT
TOWEL GREEN STERILE (TOWEL DISPOSABLE) ×2 IMPLANT
TOWEL GREEN STERILE FF (TOWEL DISPOSABLE) ×2 IMPLANT
TUBING ARTHROSCOPY IRRIG 16FT (MISCELLANEOUS) ×2 IMPLANT
WATER STERILE IRR 1000ML POUR (IV SOLUTION) ×2 IMPLANT

## 2023-07-20 NOTE — Anesthesia Procedure Notes (Signed)
Procedure Name: Intubation Date/Time: 07/20/2023 5:13 PM  Performed by: Cy Blamer, CRNAPre-anesthesia Checklist: Patient identified, Emergency Drugs available, Suction available and Patient being monitored Patient Re-evaluated:Patient Re-evaluated prior to induction Oxygen Delivery Method: Circle system utilized Preoxygenation: Pre-oxygenation with 100% oxygen Induction Type: IV induction Ventilation: Mask ventilation with difficulty and Two handed mask ventilation required Laryngoscope Size: Glidescope and 4 Grade View: Grade I Tube type: Oral Tube size: 7.5 mm Number of attempts: 1 Airway Equipment and Method: Video-laryngoscopy, Rigid stylet and Bite block Placement Confirmation: ETT inserted through vocal cords under direct vision, positive ETCO2 and breath sounds checked- equal and bilateral Secured at: 25 cm Tube secured with: Tape Dental Injury: Teeth and Oropharynx as per pre-operative assessment  Difficulty Due To: Difficulty was anticipated, Difficult Airway- due to large tongue and Difficult Airway- due to limited oral opening

## 2023-07-20 NOTE — Anesthesia Postprocedure Evaluation (Signed)
Anesthesia Post Note  Patient: Robert Mcintyre  Procedure(s) Performed: LEFT SHOULDER ARTHROSCOPY, DEBRIDEMENT, BICEPS TENODESIS, MINI OPEN ROTATOR CUFF TEAR REPAIR (Left: Shoulder)     Patient location during evaluation: PACU Anesthesia Type: General and Regional Level of consciousness: awake and alert Pain management: pain level controlled Vital Signs Assessment: post-procedure vital signs reviewed and stable Respiratory status: spontaneous breathing, nonlabored ventilation and respiratory function stable Cardiovascular status: blood pressure returned to baseline and stable Postop Assessment: no apparent nausea or vomiting Anesthetic complications: yes  Encounter Notable Events  Notable Event Outcome Phase Comment  Difficult to intubate - expected  Intraprocedure Filed from anesthesia note documentation.    Last Vitals:  Vitals:   07/20/23 2045 07/20/23 2100  BP: (!) 151/92 (!) 153/94  Pulse: 89 92  Resp: 20 20  Temp:  36.8 C  SpO2: 100% 100%    Last Pain:  Vitals:   07/20/23 2045  TempSrc:   PainSc: 0-No pain                 [place] Gustafson,W. EDMOND

## 2023-07-20 NOTE — Brief Op Note (Signed)
   07/20/2023  7:34 PM  PATIENT:  Robert Mcintyre  57 y.o. male  PRE-OPERATIVE DIAGNOSIS:  left shoulder rotator cuff tear, biceps subluxation  POST-OPERATIVE DIAGNOSIS:  left shoulder rotator cuff tear, biceps subluxation  PROCEDURE:  Procedure(s): LEFT SHOULDER ARTHROSCOPY, DEBRIDEMENT, BICEPS TENODESIS, MINI OPEN ROTATOR CUFF TEAR REPAIR  SURGEON:  Surgeon(s): August Saucer, Corrie Mckusick, MD  ASSISTANT: magnant pa   ANESTHESIA:   general  EBL: 50 ml    Total I/O In: -  Out: 30 [Blood:30]  BLOOD ADMINISTERED: none  DRAINS: none   LOCAL MEDICATIONS USED:  vanco  SPECIMEN:  No Specimen  COUNTS:  YES  TOURNIQUET:  * No tourniquets in log *  DICTATION: .Other Dictation: Dictation Number [id number]  PLAN OF CARE: Discharge to home after PACU  PATIENT DISPOSITION:  PACU - hemodynamically stable

## 2023-07-20 NOTE — Anesthesia Preprocedure Evaluation (Addendum)
Anesthesia Evaluation  Patient identified by MRN, date of birth, ID band Patient awake    Reviewed: Allergy & Precautions, NPO status , Patient's Chart, lab work & pertinent test results  History of Anesthesia Complications Negative for: history of anesthetic complications  Airway Mallampati: IV  TM Distance: >3 FB Neck ROM: Full    Dental  (+) Edentulous Upper, Missing, Dental Advisory Given, Poor Dentition   Pulmonary asthma , former smoker   breath sounds clear to auscultation       Cardiovascular hypertension, Pt. on medications (-) angina (-) Past MI and (-) CHF  Rhythm:Regular Rate:Normal     Neuro/Psych  Neuromuscular disease    GI/Hepatic negative GI ROS, Neg liver ROS,,,  Endo/Other  diabetes, Type 2, Oral Hypoglycemic Agents  Morbid obesity  Renal/GU negative Renal ROSLab Results      Component                Value               Date                      NA                       139                 07/14/2023                K                        3.8                 07/14/2023                CO2                      25                  07/14/2023                GLUCOSE                  133 (H)             07/14/2023                BUN                      11                  07/14/2023                CREATININE               1.15                07/14/2023                CALCIUM                  9.6                 07/14/2023                EGFR                     73  05/26/2023                GFRNONAA                 >60                 07/14/2023              Prostate cancer     Musculoskeletal  (+) Arthritis ,  left shoulder rotator cuff tear, biceps subluxation   Abdominal   Peds  Hematology negative hematology ROS (+) Lab Results      Component                Value               Date                      WBC                      9.2                 07/14/2023                HGB                       13.5                07/14/2023                HCT                      42.4                07/14/2023                MCV                      91.6                07/14/2023                PLT                      293                 07/14/2023              Anesthesia Other Findings Day of surgery medications reviewed with the patient.  Reproductive/Obstetrics                              Anesthesia Physical Anesthesia Plan  ASA: 3  Anesthesia Plan: General   Post-op Pain Management: Tylenol PO (pre-op)* and Regional block*   Induction: Intravenous  PONV Risk Score and Plan: 2 and Midazolam, Dexamethasone and Ondansetron  Airway Management Planned: Oral ETT and Video Laryngoscope Planned  Additional Equipment: None  Intra-op Plan:   Post-operative Plan: Extubation in OR  Informed Consent: I have reviewed the patients History and Physical, chart, labs and discussed the procedure including the risks, benefits and alternatives for the proposed anesthesia with the patient or authorized representative who has indicated his/her understanding and acceptance.     Dental advisory given  Plan Discussed with: CRNA  Anesthesia Plan Comments:         Anesthesia Quick Evaluation

## 2023-07-20 NOTE — Anesthesia Procedure Notes (Signed)
Anesthesia Regional Block: Interscalene brachial plexus block   Pre-Anesthetic Checklist: , timeout performed,  Correct Patient, Correct Site, Correct Laterality,  Correct Procedure, Correct Position, site marked,  Risks and benefits discussed,  Surgical consent,  Pre-op evaluation,  At surgeon's request and post-op pain management  Laterality: Left and Upper  Prep: chloraprep       Needles:  Injection technique: Single-shot      Needle Length: 5cm  Needle Gauge: 22     Additional Needles: Arrow StimuQuik ECHO Echogenic Stimulating PNB Needle  Procedures:,,,, ultrasound used (permanent image in chart),,    Narrative:  Start time: 07/20/2023 4:41 PM End time: 07/20/2023 4:47 PM Injection made incrementally with aspirations every 5 mL.  Performed by: Personally  Anesthesiologist: Val Eagle, MD

## 2023-07-20 NOTE — Transfer of Care (Signed)
Immediate Anesthesia Transfer of Care Note  Patient: Robert Mcintyre  Procedure(s) Performed: LEFT SHOULDER ARTHROSCOPY, DEBRIDEMENT, BICEPS TENODESIS, MINI OPEN ROTATOR CUFF TEAR REPAIR (Left: Shoulder)  Patient Location: PACU  Anesthesia Type:General and Regional  Level of Consciousness: awake and alert   Airway & Oxygen Therapy: Patient Spontanous Breathing and Patient connected to face mask oxygen  Post-op Assessment: Report given to RN and Post -op Vital signs reviewed and stable  Post vital signs: Reviewed and stable  Last Vitals:  Vitals Value Taken Time  BP 158/95 07/20/23 2022  Temp    Pulse 90 07/20/23 2028  Resp 21 07/20/23 2028  SpO2 95 % 07/20/23 2028  Vitals shown include unfiled device data.  Last Pain:  Vitals:   07/20/23 1417  TempSrc:   PainSc: 0-No pain         Complications:  Encounter Notable Events  Notable Event Outcome Phase Comment  Difficult to intubate - expected  Intraprocedure Filed from anesthesia note documentation.

## 2023-07-20 NOTE — H&P (Signed)
Robert Mcintyre is an 57 y.o. male.   Chief Complaint: left shoulder pain HPI: [name] is a 57 y.o. male who presents  office reporting left shoulder pain.  Patient is a [employer].  Really depends on where he holds his arm in position as to what kind of pain he has.  Does not do too much in terms of stressful activity with the left arm.  He states that he did hear a pop several months ago.  Since he was last seen has had an MRI scan which is reviewed.  Supraspinatus tear is present with retraction to the apex of the humeral head.  There is mild medial subluxation of the biceps tendon as well.   Past Medical History:  Diagnosis Date   Arthritis    back (had surgery 98), knees   Asthma    uses albuterol during cold weather   Diabetes mellitus without complication (HCC)    Type 2   Hypertension    Morbid obesity (HCC)    Periumbilical hernia    pt unaware   Pneumonia    Prostate cancer (HCC)    prostate cancer around 2018, had surgery   Pure hypercholesterolemia 04/24/2020   Spondylolysis, lumbar region     Past Surgical History:  Procedure Laterality Date   COLONOSCOPY N/A 09/16/2016   Procedure: COLONOSCOPY;  Surgeon: West Bali, MD;  Location: [facility];  Service: Endoscopy;  Laterality: N/A;  1:00 PM   LYMPHADENECTOMY Bilateral 04/09/2018   Procedure: LYMPHADENECTOMY;  Surgeon: Heloise Purpura, MD;  Location: [facility];  Service: Urology;  Laterality: Bilateral;   POLYPECTOMY  09/16/2016   Procedure: POLYPECTOMY;  Surgeon: West Bali, MD;  Location: [facility];  Service: Endoscopy;;  ascending colon polyp, hepatic flexure polyp, descending colon polyp,    ROBOT ASSISTED LAPAROSCOPIC RADICAL PROSTATECTOMY N/A 04/09/2018   Procedure: XI ROBOTIC ASSISTED LAPAROSCOPIC RADICAL PROSTATECTOMY LEVEL 3;  Surgeon: Heloise Purpura, MD;  Location: [facility];  Service: Urology;  Laterality: N/A;  ONLY NEEDS 210 MIN FOR ALL PROCEDURES   SPINE SURGERY  05/1997   back fusion    Family  History  Problem Relation Age of Onset   Heart disease Mother    Stroke Mother    Hypertension Mother    Diabetes Mother    Asthma Father    Hypertension Brother    Social History:  reports that he quit smoking about 44 years ago. His smoking use included cigarettes. He started smoking about 47 years ago. He has a 0.8 pack-year smoking history. He has never used smokeless tobacco. He reports that he does not currently use alcohol. He reports that he does not use drugs.  Allergies:  Allergies  Allergen Reactions   Egg-Derived Products Nausea And Vomiting    Medications Prior to Admission  Medication Sig Dispense Refill   albuterol (PROAIR HFA) 108 (90 Base) MCG/ACT inhaler Inhale 2 puffs into the lungs every 6 (six) hours as needed for wheezing or shortness of breath. 18 g 0   amlodipine-olmesartan (AZOR) 10-20 MG tablet Take 1 tablet by mouth once daily 90 tablet 0   atorvastatin (LIPITOR) 10 MG tablet Take 1 tablet (10 mg total) by mouth daily. 90 tablet 3   empagliflozin (JARDIANCE) 10 MG TABS tablet Take 1 tablet (10 mg total) by mouth daily before breakfast. 90 tablet 1   metFORMIN (GLUCOPHAGE) 1000 MG tablet Take 1 tablet (1,000 mg total) by mouth 2 (two) times daily with a meal.  180 tablet 3   QUEtiapine (SEROQUEL) 50 MG tablet Take 1 tablet (50 mg total) by mouth at bedtime. (Patient taking differently: Take 25 mg by mouth at bedtime.) 30 tablet 0   ANDROGEL PUMP 20.25 MG/ACT (1.62%) GEL Apply 3 Pump topically in the morning.     blood glucose meter kit and supplies Per insurance preference. Check blood glucose once a day. Dx E11.65 1 each 11   capsaicin (ZOSTRIX) 0.025 % cream Apply topically 2 (two) times daily. (Patient not taking: Reported on 07/04/2023) 60 g 0   glimepiride (AMARYL) 4 MG tablet Take 1 tablet (4 mg total) by mouth daily before breakfast. (Patient not taking: Reported on 07/04/2023) 30 tablet 3   Vitamin D, Ergocalciferol, (DRISDOL) 1.25 MG (50000 UNIT) CAPS  capsule Take 1 capsule (50,000 Units total) by mouth every 7 (seven) days. (Patient not taking: Reported on 07/04/2023) 8 capsule 2    No results found for this or any previous visit (from the past 48 hour(s)). No results found.  Review of Systems  Musculoskeletal:  Positive for arthralgias.  All other systems reviewed and are negative.   There were no vitals taken for this visit. Physical Exam Vitals reviewed.  HENT:     Head: Normocephalic.     Nose: Nose normal.     Mouth/Throat:     Mouth: Mucous membranes are moist.  Eyes:     Pupils: Pupils are equal, round, and reactive to light.  Cardiovascular:     Rate and Rhythm: Normal rate.     Pulses: Normal pulses.  Pulmonary:     Effort: Pulmonary effort is normal.  Abdominal:     General: Abdomen is flat.  Musculoskeletal:     Cervical back: Normal range of motion.  Skin:    General: Skin is warm.     Capillary Refill: Capillary refill takes less than 2 seconds.  Neurological:     General: No focal deficit present.     Mental Status: He is alert.  Psychiatric:        Mood and Affect: Mood normal.      Ortho examination unchanged from prior visit.  He does have fairly reasonable external rotation strength on the left and right-hand side at 5- out of 5 on the left 5+ out of 5 on the right.  Subscap strength is 5+ out of 5 bilaterally.  Does have mild tenderness of the biceps tendon in the bicipital groove but no discrete AC joint tenderness on the left.  No restriction of passive range of motion on either side.  Range of motion is approximately 60/105/165.  Does have a little bit of crepitus on the left compared to the right.  No asymmetric limitation of external rotation left versus right Assessment/Plan  Impression is left shoulder rotator cuff tear and biceps tendon pathology.  He is 57 and there is no muscle atrophy but the tear is borderline repairable.  I think [name]'s best bet would be surgical fixation of the rotator  cuff tear possibly with patch augmentation depending on the amount of tension on the repair.  Biceps tenodesis also to be done.  The risk and benefits of that procedure are discussed all questions answered.  Rehab questions also discussed.  Burnard Bunting, MD 07/20/2023, 1:37 PM

## 2023-07-21 ENCOUNTER — Encounter (HOSPITAL_COMMUNITY): Payer: Self-pay | Admitting: Orthopedic Surgery

## 2023-07-25 DIAGNOSIS — M7522 Bicipital tendinitis, left shoulder: Secondary | ICD-10-CM

## 2023-07-25 DIAGNOSIS — M65912 Unspecified synovitis and tenosynovitis, left shoulder: Secondary | ICD-10-CM

## 2023-07-25 DIAGNOSIS — M75122 Complete rotator cuff tear or rupture of left shoulder, not specified as traumatic: Secondary | ICD-10-CM

## 2023-07-27 ENCOUNTER — Ambulatory Visit (INDEPENDENT_AMBULATORY_CARE_PROVIDER_SITE_OTHER): Payer: Managed Care, Other (non HMO) | Admitting: Surgical

## 2023-07-27 ENCOUNTER — Encounter: Payer: Self-pay | Admitting: Surgical

## 2023-07-27 DIAGNOSIS — S46012A Strain of muscle(s) and tendon(s) of the rotator cuff of left shoulder, initial encounter: Secondary | ICD-10-CM

## 2023-07-27 MED ORDER — HYDROCODONE-ACETAMINOPHEN 5-325 MG PO TABS: 1.0000 | ORAL_TABLET | Freq: Four times a day (QID) | ORAL | 0 refills | Status: DC | PRN

## 2023-07-27 NOTE — Progress Notes (Signed)
Post-Op Visit Note   Patient: Robert Mcintyre           Date of Birth: 1965/11/08           MRN: [id number] Visit Date: 07/27/2023 PCP: Gilmore Laroche, FNP   Assessment & Plan:  Chief Complaint:  Chief Complaint  Patient presents with   Routine Post Op    LEFT SHOULDER SCOPE, deb, BT and MORCTR(surgery date 07-20-23)   Visit Diagnoses:  1. Traumatic incomplete tear of left rotator cuff, initial encounter     Plan: Robert Mcintyre is a 57 y.o. male who presents s/p left shoulder rotator cuff 3x3 repair and biceps tenodesis on 07/20/23.  Patient is doing well and pain is overall controlled.  Up to 65 degrees on CPM machine 3x/day.  Denies any chest pain, SOB, fevers, chills. Taking pain medication once a night to help with sleeping but wants to try less strong opioid medication.    On exam, patient has range of motion 15 degrees ER, 70 degrees abd, 100 degrees FF.  Intact EPL, FPL, finger abduction, finger adduction, pronation/supination, bicep, tricep, deltoid of operative extremity.  Axillary nerve intact with deltoid firing.  Incisions are healing well without evidence of infection or dehiscence.  Sutures removed and replaced with Steri-Strips today.  2+ radial pulse of the operative extremity  Plan is continue with CPM.  No AROM of operative shoulder or lifting with operative arm.  This was counseled with the patient. Follow-up in 2 weeks for recheck with Dr August Saucer and initiation of PT.   Follow-Up Instructions: No follow-ups on file.   Orders:  No orders of the defined types were placed in this encounter.  No orders of the defined types were placed in this encounter.   Imaging: No results found.  PMFS History: Patient Active Problem List   Diagnosis Date Noted   Synovitis of left shoulder 07/25/2023   Biceps tendonitis on left 07/25/2023   Complete tear of left rotator cuff 07/25/2023   Chronic left shoulder pain 02/07/2023   Musculoskeletal pain of upper extremity, left  10/12/2022   Vitamin D deficiency 06/29/2022   Seasonal allergies 06/29/2022   Encounter for routine adult physical exam with abnormal findings 04/22/2022   Fatigue 04/22/2022   Need for varicella vaccine 11/04/2021   Need for immunization against influenza 11/04/2021   Type 2 diabetes mellitus with hyperglycemia, without long-term current use of insulin (HCC) 11/04/2021   Chronic left-sided low back pain with left-sided sciatica 09/06/2021   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) 06/07/2021   Mass of skin of back 11/13/2020   Hyperlipidemia 10/16/2020   Insomnia, unspecified 10/16/2020   Pure hypercholesterolemia 04/24/2020   Poorly controlled diabetes mellitus (HCC) 01/25/2019   Prostate cancer (HCC) 04/09/2018   Essential hypertension 03/20/2017   Tubular adenoma of colon 09/20/2016   Encounter to establish care    Morbid obesity (HCC) 07/18/2016   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   Past Medical History:  Diagnosis Date   Arthritis    back (had surgery 98), knees   Asthma    uses albuterol during cold weather   Diabetes mellitus without complication (HCC)    Type 2   Hypertension    Morbid obesity (HCC)    Periumbilical hernia    pt unaware   Pneumonia    Prostate cancer (HCC)    prostate cancer around 2018, had surgery   Pure hypercholesterolemia 04/24/2020   Spondylolysis, lumbar region  Family History  Problem Relation Age of Onset   Heart disease Mother    Stroke Mother    Hypertension Mother    Diabetes Mother    Asthma Father    Hypertension Brother     Past Surgical History:  Procedure Laterality Date   COLONOSCOPY N/A 09/16/2016   Procedure: COLONOSCOPY;  Surgeon: West Bali, MD;  [facility]: [facility];  Service: Endoscopy;  Laterality: N/A;  1:00 PM   LYMPHADENECTOMY Bilateral 04/09/2018   Procedure: LYMPHADENECTOMY;  Surgeon: Heloise Purpura, MD;  Location: [facility];  Service: Urology;   Laterality: Bilateral;   POLYPECTOMY  09/16/2016   Procedure: POLYPECTOMY;  Surgeon: West Bali, MD;  [facility]: [facility];  Service: Endoscopy;;  ascending colon polyp, hepatic flexure polyp, descending colon polyp,    ROBOT ASSISTED LAPAROSCOPIC RADICAL PROSTATECTOMY N/A 04/09/2018   Procedure: XI ROBOTIC ASSISTED LAPAROSCOPIC RADICAL PROSTATECTOMY LEVEL 3;  Surgeon: Heloise Purpura, MD;  Location: [facility];  Service: Urology;  Laterality: N/A;  ONLY NEEDS 210 MIN FOR ALL PROCEDURES   SHOULDER ARTHROSCOPY WITH SUBACROMIAL DECOMPRESSION, ROTATOR CUFF REPAIR AND BICEP TENDON REPAIR Left 07/20/2023   Procedure: LEFT SHOULDER ARTHROSCOPY, DEBRIDEMENT, BICEPS TENODESIS, MINI OPEN ROTATOR CUFF TEAR REPAIR;  Surgeon: Cammy Copa, MD;  Location: [facility];  Service: Orthopedics;  Laterality: Left;   SPINE SURGERY  05/1997   back fusion   Social History   Occupational History   Occupation: truck driver  Tobacco Use   Smoking status: Former    Current packs/day: 0.00    Average packs/day: 0.3 packs/day for 3.0 years (0.8 ttl pk-yrs)    Types: Cigarettes    Start date: 12    Quit date: 1980    Years since quitting: 44.8   Smokeless tobacco: Never  Vaping Use   Vaping status: Never Used  Substance and Sexual Activity   Alcohol use: Not Currently    Comment: occasionally   Drug use: No   Sexual activity: Not Currently

## 2023-07-27 NOTE — Addendum Note (Signed)
Addended by: Julieanne Cotton on: 07/27/2023 10:44 AM   Modules accepted: Orders

## 2023-08-07 ENCOUNTER — Other Ambulatory Visit: Payer: Self-pay | Admitting: Family Medicine

## 2023-08-07 DIAGNOSIS — I10 Essential (primary) hypertension: Secondary | ICD-10-CM

## 2023-08-08 MED ORDER — AMLODIPINE-OLMESARTAN 10-20 MG PO TABS: 1.0000 | ORAL_TABLET | Freq: Every day | ORAL | 0 refills | Status: DC

## 2023-08-17 ENCOUNTER — Encounter: Payer: Self-pay | Admitting: Orthopedic Surgery

## 2023-08-17 ENCOUNTER — Ambulatory Visit: Payer: Managed Care, Other (non HMO) | Admitting: Orthopedic Surgery

## 2023-08-17 DIAGNOSIS — S46012A Strain of muscle(s) and tendon(s) of the rotator cuff of left shoulder, initial encounter: Secondary | ICD-10-CM

## 2023-08-17 NOTE — Progress Notes (Unsigned)
Post-Op Visit Note   Patient: Robert Mcintyre           Date of Birth: 14-May-1966           MRN: [id number] Visit Date: 08/17/2023 PCP: Gilmore Laroche, FNP   Assessment & Plan:  Chief Complaint:  Chief Complaint  Patient presents with   Left Shoulder - Routine Post Op     LEFT SHOULDER SCOPE, deb, BT and MORCTR(surgery date 07-20-23)      Visit Diagnoses:  1. Traumatic incomplete tear of left rotator cuff, initial encounter     Plan: Robert Mcintyre is a 57 y.o. male who presents s/p left shoulder rotator cuff repair and biceps tenodesis on 07/20/2023.  Patient is doing well and pain is overall controlled.  Up to 90 degrees on CPM machine.  Coming out of the sling sometimes at home.  Also doing gentle range of motion exercises that he saw on YouTube.  He has completely stopped taking opioid pain medication.  On exam, patient has range of motion 25 degrees X rotation, 85 degrees abduction, 120 degrees forward elevation passively.  Intact EPL, FPL, finger abduction, finger adduction, pronation/supination, bicep, tricep, deltoid of operative extremity.  Axillary nerve intact with deltoid firing.  Incisions are healing well without evidence of infection or dehiscence.   2+ radial pulse of the operative extremity  Plan is discontinue sling this coming Monday.  Still cautioned patient against any full active motion of the operative shoulder or any lifting with the operative arm.  He will start physical therapy.  He is okay for passive range of motion for the first 2 weeks of therapy and then start active assisted range of motion and gentle strengthening exercises 2 weeks after beginning PT.  Recommend PT for 2 sessions per week for about 6 weeks.  Follow-up in 4 weeks for clinical recheck..   Follow-Up Instructions: No follow-ups on file.   Orders:  Orders Placed This Encounter  Procedures   Ambulatory referral to Physical Therapy   No orders of the defined types were placed in this  encounter.   Imaging: No results found.  PMFS History: Patient Active Problem List   Diagnosis Date Noted   Synovitis of left shoulder 07/25/2023   Biceps tendonitis on left 07/25/2023   Complete tear of left rotator cuff 07/25/2023   Chronic left shoulder pain 02/07/2023   Musculoskeletal pain of upper extremity, left 10/12/2022   Vitamin D deficiency 06/29/2022   Seasonal allergies 06/29/2022   Encounter for routine adult physical exam with abnormal findings 04/22/2022   Fatigue 04/22/2022   Need for varicella vaccine 11/04/2021   Need for immunization against influenza 11/04/2021   Type 2 diabetes mellitus with hyperglycemia, without long-term current use of insulin (HCC) 11/04/2021   Chronic left-sided low back pain with left-sided sciatica 09/06/2021   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) 06/07/2021   Mass of skin of back 11/13/2020   Hyperlipidemia 10/16/2020   Insomnia, unspecified 10/16/2020   Pure hypercholesterolemia 04/24/2020   Poorly controlled diabetes mellitus (HCC) 01/25/2019   Prostate cancer (HCC) 04/09/2018   Essential hypertension 03/20/2017   Tubular adenoma of colon 09/20/2016   Encounter to establish care    Morbid obesity (HCC) 07/18/2016   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   Past Medical History:  Diagnosis Date   Arthritis    back (had surgery 98), knees   Asthma    uses albuterol during cold weather   Diabetes  mellitus without complication (HCC)    Type 2   Hypertension    Morbid obesity (HCC)    Periumbilical hernia    pt unaware   Pneumonia    Prostate cancer (HCC)    prostate cancer around 2018, had surgery   Pure hypercholesterolemia 04/24/2020   Spondylolysis, lumbar region     Family History  Problem Relation Age of Onset   Heart disease Mother    Stroke Mother    Hypertension Mother    Diabetes Mother    Asthma Father    Hypertension Brother     Past Surgical History:   Procedure Laterality Date   COLONOSCOPY N/A 09/16/2016   Procedure: COLONOSCOPY;  Surgeon: West Bali, MD;  [facility];  Service: Endoscopy;  Laterality: N/A;  1:00 PM   LYMPHADENECTOMY Bilateral 04/09/2018   Procedure: LYMPHADENECTOMY;  Surgeon: Heloise Purpura, MD;  Location: [facility];  Service: Urology;  Laterality: Bilateral;   POLYPECTOMY  09/16/2016   Procedure: POLYPECTOMY;  Surgeon: West Bali, MD;  [facility];  Service: Endoscopy;;  ascending colon polyp, hepatic flexure polyp, descending colon polyp,    ROBOT ASSISTED LAPAROSCOPIC RADICAL PROSTATECTOMY N/A 04/09/2018   Procedure: XI ROBOTIC ASSISTED LAPAROSCOPIC RADICAL PROSTATECTOMY LEVEL 3;  Surgeon: Heloise Purpura, MD;  Location: [facility];  Service: Urology;  Laterality: N/A;  ONLY NEEDS 210 MIN FOR ALL PROCEDURES   SHOULDER ARTHROSCOPY WITH SUBACROMIAL DECOMPRESSION, ROTATOR CUFF REPAIR AND BICEP TENDON REPAIR Left 07/20/2023   Procedure: LEFT SHOULDER ARTHROSCOPY, DEBRIDEMENT, BICEPS TENODESIS, MINI OPEN ROTATOR CUFF TEAR REPAIR;  Surgeon: Cammy Copa, MD;  Location: [facility];  Service: Orthopedics;  Laterality: Left;   SPINE SURGERY  05/1997   back fusion   Social History   Occupational History   Occupation: truck driver  Tobacco Use   Smoking status: Former    Current packs/day: 0.00    Average packs/day: 0.3 packs/day for 3.0 years (0.8 ttl pk-yrs)    Types: Cigarettes    Start date: 36    Quit date: 1980    Years since quitting: 44.9   Smokeless tobacco: Never  Vaping Use   Vaping status: Never Used  Substance and Sexual Activity   Alcohol use: Not Currently    Comment: occasionally   Drug use: No   Sexual activity: Not Currently

## 2023-08-18 ENCOUNTER — Encounter: Payer: Self-pay | Admitting: Orthopedic Surgery

## 2023-08-31 ENCOUNTER — Other Ambulatory Visit: Payer: Self-pay

## 2023-08-31 ENCOUNTER — Ambulatory Visit: Payer: Managed Care, Other (non HMO) | Attending: Orthopedic Surgery

## 2023-08-31 DIAGNOSIS — M25512 Pain in left shoulder: Secondary | ICD-10-CM | POA: Insufficient documentation

## 2023-08-31 DIAGNOSIS — M6281 Muscle weakness (generalized): Secondary | ICD-10-CM | POA: Insufficient documentation

## 2023-08-31 DIAGNOSIS — S46012A Strain of muscle(s) and tendon(s) of the rotator cuff of left shoulder, initial encounter: Secondary | ICD-10-CM | POA: Insufficient documentation

## 2023-08-31 DIAGNOSIS — M25612 Stiffness of left shoulder, not elsewhere classified: Secondary | ICD-10-CM | POA: Diagnosis present

## 2023-08-31 NOTE — Therapy (Signed)
OUTPATIENT PHYSICAL THERAPY SHOULDER EVALUATION   Patient Name: Robert Mcintyre MRN: [id number] DOB:[birth year], 58 y.o., male Today's Date: 08/31/2023  END OF SESSION:  PT End of Session - 08/31/23 0933     Visit Number 1    Number of Visits 12    Date for PT Re-Evaluation 11/24/23    PT Start Time 0934    PT Stop Time 1015    PT Time Calculation (min) 41 min    Activity Tolerance Patient tolerated treatment well    Behavior During Therapy [facility] for tasks assessed/performed             Past Medical History:  Diagnosis Date   Arthritis    back (had surgery 98), knees   Asthma    uses albuterol during cold weather   Diabetes mellitus without complication (HCC)    Type 2   Hypertension    Morbid obesity (HCC)    Periumbilical hernia    pt unaware   Pneumonia    Prostate cancer (HCC)    prostate cancer around 2018, had surgery   Pure hypercholesterolemia 04/24/2020   Spondylolysis, lumbar region    Past Surgical History:  Procedure Laterality Date   COLONOSCOPY N/A 09/16/2016   Procedure: COLONOSCOPY;  Surgeon: West Bali, MD;  [facility]: [facility];  Service: Endoscopy;  Laterality: N/A;  1:00 PM   LYMPHADENECTOMY Bilateral 04/09/2018   Procedure: LYMPHADENECTOMY;  Surgeon: Heloise Purpura, MD;  Location: [facility];  Service: Urology;  Laterality: Bilateral;   POLYPECTOMY  09/16/2016   Procedure: POLYPECTOMY;  Surgeon: West Bali, MD;  [facility]: [facility];  Service: Endoscopy;;  ascending colon polyp, hepatic flexure polyp, descending colon polyp,    ROBOT ASSISTED LAPAROSCOPIC RADICAL PROSTATECTOMY N/A 04/09/2018   Procedure: XI ROBOTIC ASSISTED LAPAROSCOPIC RADICAL PROSTATECTOMY LEVEL 3;  Surgeon: Heloise Purpura, MD;  Location: [facility];  Service: Urology;  Laterality: N/A;  ONLY NEEDS 210 MIN FOR ALL PROCEDURES   SHOULDER ARTHROSCOPY WITH SUBACROMIAL DECOMPRESSION, ROTATOR CUFF REPAIR AND BICEP TENDON REPAIR Left 07/20/2023   Procedure: LEFT SHOULDER  ARTHROSCOPY, DEBRIDEMENT, BICEPS TENODESIS, MINI OPEN ROTATOR CUFF TEAR REPAIR;  Surgeon: Cammy Copa, MD;  Location: [facility];  Service: Orthopedics;  Laterality: Left;   SPINE SURGERY  05/1997   back fusion   Patient Active Problem List   Diagnosis Date Noted   Synovitis of left shoulder 07/25/2023   Biceps tendonitis on left 07/25/2023   Complete tear of left rotator cuff 07/25/2023   Chronic left shoulder pain 02/07/2023   Musculoskeletal pain of upper extremity, left 10/12/2022   Vitamin D deficiency 06/29/2022   Seasonal allergies 06/29/2022   Encounter for routine adult physical exam with abnormal findings 04/22/2022   Fatigue 04/22/2022   Need for varicella vaccine 11/04/2021   Need for immunization against influenza 11/04/2021   Type 2 diabetes mellitus with hyperglycemia, without long-term current use of insulin (HCC) 11/04/2021   Chronic left-sided low back pain with left-sided sciatica 09/06/2021   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) 06/07/2021   Mass of skin of back 11/13/2020   Hyperlipidemia 10/16/2020   Insomnia, unspecified 10/16/2020   Pure hypercholesterolemia 04/24/2020   Poorly controlled diabetes mellitus (HCC) 01/25/2019   Prostate cancer (HCC) 04/09/2018   Essential hypertension 03/20/2017   Tubular adenoma of colon 09/20/2016   Encounter to establish care    Morbid obesity (HCC) 07/18/2016   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   REFERRING PROVIDER: Rise Paganini  Lorin Picket, MD   REFERRING DIAG: Traumatic incomplete tear of left rotator cuff, initial encounter   THERAPY DIAG:  Acute pain of left shoulder  Stiffness of left shoulder, not elsewhere classified  Muscle weakness (generalized)  Rationale for Evaluation and Treatment: Rehabilitation  ONSET DATE: 07/20/23  SUBJECTIVE:                                                                                                                                                                                       SUBJECTIVE STATEMENT: Patient reports that he had surgery on his left shoulder on 07/20/23. He had had been using a CPM machine for his shoulder until they came and got it yesterday. He has been having trouble with his normal daily activities such as getting dressed and sleeping at night. He has just been able to return to sleeping in his bed, but he still has to be elevated with something under his left arm. He was told that he could spend more time out of his sling when he last saw his surgeon, but he was told to avoid heavy lifting or moving his arm without assistance.  Hand dominance: Right  PERTINENT HISTORY: Hypertension, diabetes type 2, chronic low back pain, history of prostate cancer, allergies, arthritis, and asthma  PAIN:  Are you having pain? Yes: NPRS scale: 6-7/10 Pain location: left shoulder with intermittent neck pain Pain description: constant sore and aching Aggravating factors: moving his arm the wrong way, getting dressed Relieving factors: rest  PRECAUTIONS: Shoulder  RED FLAGS: None   WEIGHT BEARING RESTRICTIONS: No  FALLS:  Has patient fallen in last 6 months? 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  PATIENT SURVEYS:  FOTO 19.95  COGNITION: Overall cognitive status: Within functional limits for tasks assessed     SENSATION: Patient reports no numbness or tingling.   POSTURE: Forward head  UPPER EXTREMITY ROM:   Active ROM Right eval Left Eval (PROM)   Shoulder flexion 156 109  Shoulder extension    Shoulder abduction 149 90  Shoulder adduction     Shoulder internal rotation To T 8   Shoulder external rotation To T3   Elbow flexion    Elbow extension    Wrist flexion    Wrist extension    Wrist ulnar deviation  Wrist radial deviation    Wrist pronation    Wrist supination    (Blank rows = not tested)  UPPER EXTREMITY MMT: not tested due to surgical condition  SHOULDER SPECIAL TESTS: Not tested due to surgical condition  PALPATION:  TTP: left upper trapezius, supraspinatus, scapular stabilizers, deltoid, pectoralis major, and subscapularis   TODAY'S TREATMENT:                                                                                                                                         DATE:                                    08/31/23 EXERCISE LOG  Exercise Repetitions and Resistance Comments  Scapular retraction  5 reps    Table slides  5 reps Flexion                Blank cell = exercise not performed today   PATIENT EDUCATION: Education details: Plan of care, prognosis, healing, objective findings, and goals for therapy Person educated: Patient Education method: Explanation Education comprehension: verbalized understanding  HOME EXERCISE PROGRAM: [id number]  ASSESSMENT:  CLINICAL IMPRESSION: Patient is a 57 y.o. male who was seen today for physical therapy evaluation and treatment following a left rotator cuff repair on 07/20/23. He presented with moderate pain severity and irritability with left shoulder passive range of motion being the most aggravating to his familiar symptoms. He exhibited reduced left shoulder passive range of motion with left shoulder pain and stiffness being his primary limiting factors. Recommend that he continue with skilled physical therapy to address his impairments to return to his prior level of function.  OBJECTIVE IMPAIRMENTS: decreased activity tolerance, decreased ROM, decreased strength, hypomobility, impaired flexibility, impaired tone, impaired UE functional use, and  pain.   ACTIVITY LIMITATIONS: carrying, lifting, sleeping, bathing, dressing, self feeding, reach over head, and hygiene/grooming  PARTICIPATION LIMITATIONS: meal prep, cleaning, laundry, shopping, community activity, and occupation  PERSONAL FACTORS: Profession, Time since onset of injury/illness/exacerbation, and 3+ comorbidities: Hypertension, diabetes type 2, chronic low back pain, history of prostate cancer, allergies, arthritis, and asthma  are also affecting patient's functional outcome.   REHAB POTENTIAL: Good  CLINICAL DECISION MAKING: Evolving/moderate complexity  EVALUATION COMPLEXITY: Moderate   GOALS: Goals reviewed with patient? Yes  SHORT TERM GOALS: Target date: 09/21/23  Patient will be independent with his initial HEP. Baseline: Goal status: INITIAL  2.  Patient will be able to achieve least 125 degrees of passive left shoulder flexion for improved shoulder mobility. Baseline:  Goal status: INITIAL  LONG TERM GOALS: Target date: 10/11/22  Patient will be independent with his advanced HEP. Baseline:  Goal status: INITIAL  2.  Patient will be able to demonstrate at least 125 degrees of active left shoulder flexion for improved function with overhead  activities. Baseline:  Goal status: INITIAL  3.  Patient will be able to demonstrate at least 125 degrees of left shoulder abduction for improved function with overhead activities. Baseline:  Goal status: INITIAL  4.  Patient will be able to carry at least 8 pounds with his left upper extremity for improved function with household activities. Baseline:  Goal status: INITIAL  5.  Patient will be able to return to his critical job demands without being limited by his left shoulder. Baseline:  Goal status: INITIAL  PLAN:  PT FREQUENCY: 2x/week  PT DURATION: 6 weeks  PLANNED INTERVENTIONS: 97164- PT Re-evaluation, 97110-Therapeutic exercises, 97530- Therapeutic activity, 97112- Neuromuscular re-education,  97535- Self Care, 16109- Manual therapy, 97014- Electrical stimulation (unattended), 97016- Vasopneumatic device, Patient/Family education, Taping, Joint mobilization, Cryotherapy, and Moist heat  PLAN FOR NEXT SESSION: Pulleys, isometrics, active assisted range of motion, manual therapy, and modalities as needed   Granville Lewis, PT 08/31/2023, 5:52 PM

## 2023-09-05 ENCOUNTER — Ambulatory Visit: Payer: Managed Care, Other (non HMO)

## 2023-09-05 DIAGNOSIS — M25512 Pain in left shoulder: Secondary | ICD-10-CM | POA: Diagnosis not present

## 2023-09-05 DIAGNOSIS — M25612 Stiffness of left shoulder, not elsewhere classified: Secondary | ICD-10-CM

## 2023-09-05 DIAGNOSIS — M6281 Muscle weakness (generalized): Secondary | ICD-10-CM

## 2023-09-05 NOTE — Therapy (Signed)
OUTPATIENT PHYSICAL THERAPY SHOULDER TREATMENT   Patient Name: Robert Mcintyre MRN: [id number] DOB:08-02-1966, 57 y.o., male Today's Date: 09/05/2023  END OF SESSION:  PT End of Session - 09/05/23 0848     Visit Number 2    Number of Visits 12    Date for PT Re-Evaluation 11/24/23    PT Start Time 0845    PT Stop Time 0930    PT Time Calculation (min) 45 min    Activity Tolerance Patient tolerated treatment well    Behavior During Therapy [facility] for tasks assessed/performed              Past Medical History:  Diagnosis Date   Arthritis    back (had surgery 98), knees   Asthma    uses albuterol during cold weather   Diabetes mellitus without complication (HCC)    Type 2   Hypertension    Morbid obesity (HCC)    Periumbilical hernia    pt unaware   Pneumonia    Prostate cancer (HCC)    prostate cancer around 2018, had surgery   Pure hypercholesterolemia 04/24/2020   Spondylolysis, lumbar region    Past Surgical History:  Procedure Laterality Date   COLONOSCOPY N/A 09/16/2016   Procedure: COLONOSCOPY;  Surgeon: West Bali, MD;  [facility];  Service: Endoscopy;  Laterality: N/A;  1:00 PM   LYMPHADENECTOMY Bilateral 04/09/2018   Procedure: LYMPHADENECTOMY;  Surgeon: Heloise Purpura, MD;  Location: [facility];  Service: Urology;  Laterality: Bilateral;   POLYPECTOMY  09/16/2016   Procedure: POLYPECTOMY;  Surgeon: West Bali, MD;  [facility];  Service: Endoscopy;;  ascending colon polyp, hepatic flexure polyp, descending colon polyp,    ROBOT ASSISTED LAPAROSCOPIC RADICAL PROSTATECTOMY N/A 04/09/2018   Procedure: XI ROBOTIC ASSISTED LAPAROSCOPIC RADICAL PROSTATECTOMY LEVEL 3;  Surgeon: Heloise Purpura, MD;  Location: [facility];  Service: Urology;  Laterality: N/A;  ONLY NEEDS 210 MIN FOR ALL PROCEDURES   SHOULDER ARTHROSCOPY WITH SUBACROMIAL DECOMPRESSION, ROTATOR CUFF REPAIR AND BICEP TENDON REPAIR Left 07/20/2023   Procedure: LEFT SHOULDER  ARTHROSCOPY, DEBRIDEMENT, BICEPS TENODESIS, MINI OPEN ROTATOR CUFF TEAR REPAIR;  Surgeon: Cammy Copa, MD;  Location: [facility];  Service: Orthopedics;  Laterality: Left;   SPINE SURGERY  05/1997   back fusion   Patient Active Problem List   Diagnosis Date Noted   Synovitis of left shoulder 07/25/2023   Biceps tendonitis on left 07/25/2023   Complete tear of left rotator cuff 07/25/2023   Chronic left shoulder pain 02/07/2023   Musculoskeletal pain of upper extremity, left 10/12/2022   Vitamin D deficiency 06/29/2022   Seasonal allergies 06/29/2022   Encounter for routine adult physical exam with abnormal findings 04/22/2022   Fatigue 04/22/2022   Need for varicella vaccine 11/04/2021   Need for immunization against influenza 11/04/2021   Type 2 diabetes mellitus with hyperglycemia, without long-term current use of insulin (HCC) 11/04/2021   Chronic left-sided low back pain with left-sided sciatica 09/06/2021   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) 06/07/2021   Mass of skin of back 11/13/2020   Hyperlipidemia 10/16/2020   Insomnia, unspecified 10/16/2020   Pure hypercholesterolemia 04/24/2020   Poorly controlled diabetes mellitus (HCC) 01/25/2019   Prostate cancer (HCC) 04/09/2018   Essential hypertension 03/20/2017   Tubular adenoma of colon 09/20/2016   Encounter to establish care    Morbid obesity (HCC) 07/18/2016   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   REFERRING PROVIDER: August Saucer,  Corrie Mckusick, MD   REFERRING DIAG: Traumatic incomplete tear of left rotator cuff, initial encounter   THERAPY DIAG:  Acute pain of left shoulder  Stiffness of left shoulder, not elsewhere classified  Muscle weakness (generalized)  Rationale for Evaluation and Treatment: Rehabilitation  ONSET DATE: 07/20/23  SUBJECTIVE:                                                                                                                                                                                       SUBJECTIVE STATEMENT: Patient reports that his shoulder feels alright today. He has not been doing his HEP as much as he should.  Hand dominance: Right  PERTINENT HISTORY: Hypertension, diabetes type 2, chronic low back pain, history of prostate cancer, allergies, arthritis, and asthma  PAIN:  Are you having pain? Yes: NPRS scale: 4-5/10 Pain location: left shoulder with intermittent neck pain Pain description: constant sore and aching Aggravating factors: moving his arm the wrong way, getting dressed Relieving factors: rest  PRECAUTIONS: Shoulder  RED FLAGS: None   WEIGHT BEARING RESTRICTIONS: No  FALLS:  Has patient fallen in last 6 months? 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  PATIENT SURVEYS:  FOTO 19.95  COGNITION: Overall cognitive status: Within functional limits for tasks assessed     SENSATION: Patient reports no numbness or tingling.   POSTURE: Forward head  UPPER EXTREMITY ROM:   Active ROM Right eval Left Eval (PROM)   Shoulder flexion 156 109  Shoulder extension    Shoulder abduction 149 90  Shoulder adduction    Shoulder internal rotation To T 8   Shoulder external rotation To T3   Elbow flexion    Elbow extension    Wrist flexion    Wrist extension    Wrist ulnar deviation    Wrist radial deviation    Wrist pronation    Wrist supination    (Blank rows = not tested)  UPPER EXTREMITY MMT: not tested due to surgical condition  SHOULDER SPECIAL TESTS: Not tested due to surgical condition  PALPATION:  TTP: left upper trapezius, supraspinatus,  scapular stabilizers, deltoid, pectoralis major, and subscapularis   TODAY'S TREATMENT:  DATE:                                    09/05/23 EXERCISE LOG  Exercise Repetitions and Resistance Comments  Pulleys 5.5 minutes  Scaption  Ball roll out  3 minutes  AA shoulder flexion               Blank cell = exercise not performed today  Manual Therapy Soft Tissue Mobilization: left anterior shoulder musculature, for reduced pain and tone Passive ROM: left shoulder flexion, ER, and abduction, to tolerance                                     08/31/23 EXERCISE LOG  Exercise Repetitions and Resistance Comments  Scapular retraction  5 reps    Table slides  5 reps Flexion                Blank cell = exercise not performed today   PATIENT EDUCATION: Education details: healing, prognosis, and precautions Person educated: Patient Education method: Explanation Education comprehension: verbalized understanding  HOME EXERCISE PROGRAM: [id number]  ASSESSMENT:  CLINICAL IMPRESSION: Patient was introduced to light active assisted flexion and scaption followed by manual therapy. Manual therapy focused on soft tissue mobilization to his anterior shoulder musculature and passive range of motion. He required significant education regarding the expectations for healing following his surgical condition and his precautions to reduce his risk of re injury. He reported understanding. He reported that his shoulder felt a little tight upon the conclusion of treatment. He continues to require skilled physical therapy to address his remaining impairments to return to his prior level of function.   OBJECTIVE IMPAIRMENTS: decreased activity tolerance, decreased ROM, decreased strength, hypomobility, impaired flexibility, impaired tone, impaired UE functional use, and pain.    ACTIVITY LIMITATIONS: carrying, lifting, sleeping, bathing, dressing, self feeding, reach over head, and hygiene/grooming  PARTICIPATION LIMITATIONS: meal prep, cleaning, laundry, shopping, community activity, and occupation  PERSONAL FACTORS: Profession, Time since onset of injury/illness/exacerbation, and 3+ comorbidities: Hypertension, diabetes type 2, chronic low back pain, history of prostate cancer, allergies, arthritis, and asthma  are also affecting patient's functional outcome.   REHAB POTENTIAL: Good  CLINICAL DECISION MAKING: Evolving/moderate complexity  EVALUATION COMPLEXITY: Moderate   GOALS: Goals reviewed with patient? Yes  SHORT TERM GOALS: Target date: 09/21/23  Patient will be independent with his initial HEP. Baseline: Goal status: INITIAL  2.  Patient will be able to achieve least 125 degrees of passive left shoulder flexion for improved shoulder mobility. Baseline:  Goal status: INITIAL  LONG TERM GOALS: Target date: 10/11/22  Patient will be independent with his advanced HEP. Baseline:  Goal status: INITIAL  2.  Patient will be able to demonstrate at least 125 degrees of active left shoulder flexion for improved function with overhead activities. Baseline:  Goal status: INITIAL  3.  Patient will be able to demonstrate at least 125 degrees of left shoulder abduction for improved function with overhead activities. Baseline:  Goal status: INITIAL  4.  Patient will be able to carry at least 8 pounds with his left upper extremity for improved function with household activities. Baseline:  Goal status: INITIAL  5.  Patient will be able to return to his critical job demands without being limited by his left shoulder. Baseline:  Goal status: INITIAL  PLAN:  PT FREQUENCY: 2x/week  PT DURATION: 6 weeks  PLANNED INTERVENTIONS: 97164- PT Re-evaluation, 97110-Therapeutic exercises, 97530- Therapeutic activity, 97112- Neuromuscular re-education,  97535- Self Care, 09323- Manual therapy, 97014- Electrical stimulation (unattended), 97016- Vasopneumatic device, Patient/Family education, Taping, Joint mobilization, Cryotherapy, and Moist heat  PLAN FOR NEXT SESSION: Pulleys, isometrics, active assisted range of motion, manual therapy, and modalities as needed   Granville Lewis, PT 09/05/2023, 10:43 AM

## 2023-09-07 ENCOUNTER — Ambulatory Visit: Payer: Managed Care, Other (non HMO)

## 2023-09-07 DIAGNOSIS — M25612 Stiffness of left shoulder, not elsewhere classified: Secondary | ICD-10-CM

## 2023-09-07 DIAGNOSIS — M25512 Pain in left shoulder: Secondary | ICD-10-CM | POA: Diagnosis not present

## 2023-09-07 DIAGNOSIS — M6281 Muscle weakness (generalized): Secondary | ICD-10-CM

## 2023-09-07 NOTE — Therapy (Signed)
OUTPATIENT PHYSICAL THERAPY SHOULDER TREATMENT   Patient Name: Robert Mcintyre MRN: [id number] DOB:1966/08/12, 57 y.o., male Today's Date: 09/07/2023  END OF SESSION:  PT End of Session - 09/07/23 0855     Visit Number 3    Number of Visits 12    Date for PT Re-Evaluation 11/24/23    PT Start Time 0855    PT Stop Time 0908    PT Time Calculation (min) 13 min    Activity Tolerance Patient tolerated treatment well    Behavior During Therapy [facility] for tasks assessed/performed              Past Medical History:  Diagnosis Date   Arthritis    back (had surgery 98), knees   Asthma    uses albuterol during cold weather   Diabetes mellitus without complication (HCC)    Type 2   Hypertension    Morbid obesity (HCC)    Periumbilical hernia    pt unaware   Pneumonia    Prostate cancer (HCC)    prostate cancer around 2018, had surgery   Pure hypercholesterolemia 04/24/2020   Spondylolysis, lumbar region    Past Surgical History:  Procedure Laterality Date   COLONOSCOPY N/A 09/16/2016   Procedure: COLONOSCOPY;  Surgeon: West Bali, MD;  [facility];  Service: Endoscopy;  Laterality: N/A;  1:00 PM   LYMPHADENECTOMY Bilateral 04/09/2018   Procedure: LYMPHADENECTOMY;  Surgeon: Heloise Purpura, MD;  Location: [facility];  Service: Urology;  Laterality: Bilateral;   POLYPECTOMY  09/16/2016   Procedure: POLYPECTOMY;  Surgeon: West Bali, MD;  [facility];  Service: Endoscopy;;  ascending colon polyp, hepatic flexure polyp, descending colon polyp,    ROBOT ASSISTED LAPAROSCOPIC RADICAL PROSTATECTOMY N/A 04/09/2018   Procedure: XI ROBOTIC ASSISTED LAPAROSCOPIC RADICAL PROSTATECTOMY LEVEL 3;  Surgeon: Heloise Purpura, MD;  Location: [facility];  Service: Urology;  Laterality: N/A;  ONLY NEEDS 210 MIN FOR ALL PROCEDURES   SHOULDER ARTHROSCOPY WITH SUBACROMIAL DECOMPRESSION, ROTATOR CUFF REPAIR AND BICEP TENDON REPAIR Left 07/20/2023   Procedure: LEFT SHOULDER  ARTHROSCOPY, DEBRIDEMENT, BICEPS TENODESIS, MINI OPEN ROTATOR CUFF TEAR REPAIR;  Surgeon: Cammy Copa, MD;  Location: [facility];  Service: Orthopedics;  Laterality: Left;   SPINE SURGERY  05/1997   back fusion   Patient Active Problem List   Diagnosis Date Noted   Synovitis of left shoulder 07/25/2023   Biceps tendonitis on left 07/25/2023   Complete tear of left rotator cuff 07/25/2023   Chronic left shoulder pain 02/07/2023   Musculoskeletal pain of upper extremity, left 10/12/2022   Vitamin D deficiency 06/29/2022   Seasonal allergies 06/29/2022   Encounter for routine adult physical exam with abnormal findings 04/22/2022   Fatigue 04/22/2022   Need for varicella vaccine 11/04/2021   Need for immunization against influenza 11/04/2021   Type 2 diabetes mellitus with hyperglycemia, without long-term current use of insulin (HCC) 11/04/2021   Chronic left-sided low back pain with left-sided sciatica 09/06/2021   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) 06/07/2021   Mass of skin of back 11/13/2020   Hyperlipidemia 10/16/2020   Insomnia, unspecified 10/16/2020   Pure hypercholesterolemia 04/24/2020   Poorly controlled diabetes mellitus (HCC) 01/25/2019   Prostate cancer (HCC) 04/09/2018   Essential hypertension 03/20/2017   Tubular adenoma of colon 09/20/2016   Encounter to establish care    Morbid obesity (HCC) 07/18/2016   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   REFERRING PROVIDER: August Saucer,  Corrie Mckusick, MD   REFERRING DIAG: Traumatic incomplete tear of left rotator cuff, initial encounter   THERAPY DIAG:  Acute pain of left shoulder  Stiffness of left shoulder, not elsewhere classified  Muscle weakness (generalized)  Rationale for Evaluation and Treatment: Rehabilitation  ONSET DATE: 07/20/23  SUBJECTIVE:                                                                                                                                                                                       SUBJECTIVE STATEMENT: Patient reports 3-4/10 left shoulder pain today.   Hand dominance: Right  PERTINENT HISTORY: Hypertension, diabetes type 2, chronic low back pain, history of prostate cancer, allergies, arthritis, and asthma  PAIN:  Are you having pain? Yes: NPRS scale: 3-4/10 Pain location: left shoulder with intermittent neck pain Pain description: constant sore and aching Aggravating factors: moving his arm the wrong way, getting dressed Relieving factors: rest  PRECAUTIONS: Shoulder  RED FLAGS: None   WEIGHT BEARING RESTRICTIONS: No  FALLS:  Has patient fallen in last 6 months? 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  PATIENT SURVEYS:  FOTO 19.95  COGNITION: Overall cognitive status: Within functional limits for tasks assessed     SENSATION: Patient reports no numbness or tingling.   POSTURE: Forward head  UPPER EXTREMITY ROM:   Active ROM Right eval Left Eval (PROM)   Shoulder flexion 156 109  Shoulder extension    Shoulder abduction 149 90  Shoulder adduction    Shoulder internal rotation To T 8   Shoulder external rotation To T3   Elbow flexion    Elbow extension    Wrist flexion    Wrist extension    Wrist ulnar deviation    Wrist radial deviation    Wrist pronation    Wrist supination    (Blank rows = not tested)  UPPER EXTREMITY MMT: not tested due to surgical condition  SHOULDER SPECIAL TESTS: Not tested due to surgical condition  PALPATION:  TTP: left upper trapezius, supraspinatus, scapular stabilizers, deltoid, pectoralis major, and  subscapularis   TODAY'S TREATMENT:  DATE:                                    09/07/23 EXERCISE LOG  Exercise Repetitions and Resistance Comments  Pulleys 6 minutes  Scaption  Ball roll out  3 minutes  AA shoulder flexion               Blank cell = exercise not performed today                                    08/31/23 EXERCISE LOG  Exercise Repetitions and Resistance Comments  Scapular retraction  5 reps    Table slides  5 reps Flexion                Blank cell = exercise not performed today   PATIENT EDUCATION: Education details: healing, prognosis, and precautions Person educated: Patient Education method: Explanation Education comprehension: verbalized understanding  HOME EXERCISE PROGRAM: [id number]  ASSESSMENT:  CLINICAL IMPRESSION: Pt arrives for today's treatment session reporting 3-4/10 left shoulder pain.  After performing ball roll outs on table pt requested to sit down due to not feeling well.  Pt stepped outside to cool off and began to feel better immediately.  Pt requested treatment to be ended at this time.  Pt encouraged to eat breakfast and drink water upon arriving home.  Pt also encouraged to continue performance of HEP as previously instructed.  Pt denied any change in pain at completion of today's treatment session.   OBJECTIVE IMPAIRMENTS: decreased activity tolerance, decreased ROM, decreased strength, hypomobility, impaired flexibility, impaired tone, impaired UE functional use, and pain.   ACTIVITY LIMITATIONS: carrying, lifting, sleeping, bathing, dressing, self feeding, reach over head, and hygiene/grooming  PARTICIPATION LIMITATIONS: meal prep, cleaning, laundry, shopping, community activity, and occupation  PERSONAL FACTORS: Profession, Time since onset of injury/illness/exacerbation, and 3+ comorbidities:  Hypertension, diabetes type 2, chronic low back pain, history of prostate cancer, allergies, arthritis, and asthma  are also affecting patient's functional outcome.   REHAB POTENTIAL: Good  CLINICAL DECISION MAKING: Evolving/moderate complexity  EVALUATION COMPLEXITY: Moderate   GOALS: Goals reviewed with patient? Yes  SHORT TERM GOALS: Target date: 09/21/23  Patient will be independent with his initial HEP. Baseline: Goal status: INITIAL  2.  Patient will be able to achieve least 125 degrees of passive left shoulder flexion for improved shoulder mobility. Baseline:  Goal status: INITIAL  LONG TERM GOALS: Target date: 10/11/22  Patient will be independent with his advanced HEP. Baseline:  Goal status: INITIAL  2.  Patient will be able to demonstrate at least 125 degrees of active left shoulder flexion for improved function with overhead activities. Baseline:  Goal status: INITIAL  3.  Patient will be able to demonstrate at least 125 degrees of left shoulder abduction for improved function with overhead activities. Baseline:  Goal status: INITIAL  4.  Patient will be able to carry at least 8 pounds with his left upper extremity for improved function with household activities. Baseline:  Goal status: INITIAL  5.  Patient will be able to return to his critical job demands without being limited by his left shoulder. Baseline:  Goal status: INITIAL  PLAN:  PT FREQUENCY: 2x/week  PT DURATION: 6 weeks  PLANNED INTERVENTIONS: 97164- PT Re-evaluation, 97110-Therapeutic exercises, 97530- Therapeutic activity, [id number]- Neuromuscular re-education, 97535- Self Care, 28413-  Manual therapy, 97014- Electrical stimulation (unattended), 97016- Vasopneumatic device, Patient/Family education, Taping, Joint mobilization, Cryotherapy, and Moist heat  PLAN FOR NEXT SESSION: Pulleys, isometrics, active assisted range of motion, manual therapy, and modalities as needed   Newman Pies,  PTA 09/07/2023, 9:19 AM

## 2023-09-12 ENCOUNTER — Ambulatory Visit: Payer: Managed Care, Other (non HMO) | Admitting: *Deleted

## 2023-09-12 ENCOUNTER — Encounter: Payer: Self-pay | Admitting: *Deleted

## 2023-09-12 DIAGNOSIS — M6281 Muscle weakness (generalized): Secondary | ICD-10-CM

## 2023-09-12 DIAGNOSIS — M25512 Pain in left shoulder: Secondary | ICD-10-CM

## 2023-09-12 DIAGNOSIS — M25612 Stiffness of left shoulder, not elsewhere classified: Secondary | ICD-10-CM

## 2023-09-12 NOTE — Therapy (Addendum)
OUTPATIENT PHYSICAL THERAPY SHOULDER TREATMENT   Patient Name: Robert Mcintyre MRN: [id number] DOB:1965/12/24, 57 y.o., male Today's Date: 09/12/2023  END OF SESSION:  PT End of Session - 09/12/23 0850     Visit Number 4    Number of Visits 12    Date for PT Re-Evaluation 11/24/23    PT Start Time 0845    PT Stop Time 0945    PT Time Calculation (min) 60 min              Past Medical History:  Diagnosis Date   Arthritis    back (had surgery 98), knees   Asthma    uses albuterol during cold weather   Diabetes mellitus without complication (HCC)    Type 2   Hypertension    Morbid obesity (HCC)    Periumbilical hernia    pt unaware   Pneumonia    Prostate cancer (HCC)    prostate cancer around 2018, had surgery   Pure hypercholesterolemia 04/24/2020   Spondylolysis, lumbar region    Past Surgical History:  Procedure Laterality Date   COLONOSCOPY N/A 09/16/2016   Procedure: COLONOSCOPY;  Surgeon: West Bali, MD;  [facility];  Service: Endoscopy;  Laterality: N/A;  1:00 PM   LYMPHADENECTOMY Bilateral 04/09/2018   Procedure: LYMPHADENECTOMY;  Surgeon: Heloise Purpura, MD;  [facility]: [facility];  Service: Urology;  Laterality: Bilateral;   POLYPECTOMY  09/16/2016   Procedure: POLYPECTOMY;  Surgeon: West Bali, MD;  [facility];  Service: Endoscopy;;  ascending colon polyp, hepatic flexure polyp, descending colon polyp,    ROBOT ASSISTED LAPAROSCOPIC RADICAL PROSTATECTOMY N/A 04/09/2018   Procedure: XI ROBOTIC ASSISTED LAPAROSCOPIC RADICAL PROSTATECTOMY LEVEL 3;  Surgeon: Heloise Purpura, MD;  [facility];  Service: Urology;  Laterality: N/A;  ONLY NEEDS 210 MIN FOR ALL PROCEDURES   SHOULDER ARTHROSCOPY WITH SUBACROMIAL DECOMPRESSION, ROTATOR CUFF REPAIR AND BICEP TENDON REPAIR Left 07/20/2023   Procedure: LEFT SHOULDER ARTHROSCOPY, DEBRIDEMENT, BICEPS TENODESIS, MINI OPEN ROTATOR CUFF TEAR REPAIR;  Surgeon: Cammy Copa, MD;  Location:  [facility];  Service: Orthopedics;  Laterality: Left;   SPINE SURGERY  05/1997   back fusion   Patient Active Problem List   Diagnosis Date Noted   Synovitis of left shoulder 07/25/2023   Biceps tendonitis on left 07/25/2023   Complete tear of left rotator cuff 07/25/2023   Chronic left shoulder pain 02/07/2023   Musculoskeletal pain of upper extremity, left 10/12/2022   Vitamin D deficiency 06/29/2022   Seasonal allergies 06/29/2022   Encounter for routine adult physical exam with abnormal findings 04/22/2022   Fatigue 04/22/2022   Need for varicella vaccine 11/04/2021   Need for immunization against influenza 11/04/2021   Type 2 diabetes mellitus with hyperglycemia, without long-term current use of insulin (HCC) 11/04/2021   Chronic left-sided low back pain with left-sided sciatica 09/06/2021   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) 06/07/2021   Mass of skin of back 11/13/2020   Hyperlipidemia 10/16/2020   Insomnia, unspecified 10/16/2020   Pure hypercholesterolemia 04/24/2020   Poorly controlled diabetes mellitus (HCC) 01/25/2019   Prostate cancer (HCC) 04/09/2018   Essential hypertension 03/20/2017   Tubular adenoma of colon 09/20/2016   Encounter to establish care    Morbid obesity (HCC) 07/18/2016   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   REFERRING PROVIDER: Cammy Copa, MD   REFERRING DIAG: Traumatic incomplete tear of left rotator cuff, initial encounter   THERAPY  DIAG:  Acute pain of left shoulder  Stiffness of left shoulder, not elsewhere classified  Muscle weakness (generalized)  Rationale for Evaluation and Treatment: Rehabilitation  ONSET DATE: 07/20/23  SUBJECTIVE:                                                                                                                                                                                      SUBJECTIVE STATEMENT: Patient reports 3-4/10 left shoulder pain  today. Very stiff. To MD 09-14-23  Hand dominance: Right  PERTINENT HISTORY: Hypertension, diabetes type 2, chronic low back pain, history of prostate cancer, allergies, arthritis, and asthma  PAIN:  Are you having pain? Yes: NPRS scale: 3-4/10 Pain location: left shoulder with intermittent neck pain Pain description: constant sore and aching Aggravating factors: moving his arm the wrong way, getting dressed Relieving factors: rest  PRECAUTIONS: Shoulder  RED FLAGS: None   WEIGHT BEARING RESTRICTIONS: No  FALLS:  Has patient fallen in last 6 months? 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  PATIENT SURVEYS:  FOTO 19.95  COGNITION: Overall cognitive status: Within functional limits for tasks assessed     SENSATION: Patient reports no numbness or tingling.   POSTURE: Forward head  UPPER EXTREMITY ROM:   Active ROM Right eval Left Eval (PROM)   Shoulder flexion 156 109  Shoulder extension    Shoulder abduction 149 90  Shoulder adduction    Shoulder internal rotation To T 8   Shoulder external rotation To T3   Elbow flexion    Elbow extension    Wrist flexion    Wrist extension    Wrist ulnar deviation    Wrist radial deviation    Wrist pronation    Wrist supination    (Blank rows = not tested)  UPPER EXTREMITY MMT: not tested due to surgical condition  SHOULDER SPECIAL TESTS: Not tested due to surgical condition  PALPATION:  TTP: left upper trapezius, supraspinatus, scapular stabilizers, deltoid, pectoralis major, and subscapularis   TODAY'S TREATMENT:  DATE:                                    09/12/23 EXERCISE LOG     LT shldr  Exercise Repetitions and Resistance Comments  Pulleys 6 minutes  Scaption  Ball roll out   AA shoulder flexion  Seated UE ranger X 5 mins   Table slide  Flexion, circles CW, CCW  3x10        Blank cell = exercise not performed today  Discussed and reviewed HEP Manual PROM for LT shldr flexion to 155 degrees, ABD to 135 degrees, and ER to 60 degrees. VASO x 15 mins    LT shldr seated.                                          08/31/23 EXERCISE LOG  Exercise Repetitions and Resistance Comments  Scapular retraction  5 reps    Table slides  5 reps Flexion                Blank cell = exercise not performed today    PATIENT EDUCATION: Education details: healing, prognosis, and precautions Person educated: Patient Education method: Explanation Education comprehension: verbalized understanding  HOME EXERCISE PROGRAM: [id number]  ASSESSMENT:  CLINICAL IMPRESSION: Pt arrives for today's treatment session reporting 3-4/10 left shoulder pain and will f/u with MD on Thursday. He was able to continue with LT UE AAROM therex as well as manual PROM.  MD note for visit Thursday. Vaso tolerated very well end of session.  09/12/23 PROGRESS REPORT:  Patient is making good progress for his current phase of healing following his surgery on 07/20/23. He was able to meet his short term goals for physical therapy. He will continue to be progressed, as appropriate. Recommend that he continue with his current plan of care to address his remaining impairments to return to his prior level of function.   Candi Leash, PT, DPT   OBJECTIVE IMPAIRMENTS: decreased activity tolerance, decreased ROM, decreased strength, hypomobility, impaired flexibility, impaired tone, impaired UE functional use, and pain.   ACTIVITY LIMITATIONS: carrying, lifting,  sleeping, bathing, dressing, self feeding, reach over head, and hygiene/grooming  PARTICIPATION LIMITATIONS: meal prep, cleaning, laundry, shopping, community activity, and occupation  PERSONAL FACTORS: Profession, Time since onset of injury/illness/exacerbation, and 3+ comorbidities: Hypertension, diabetes type 2, chronic low back pain, history of prostate cancer, allergies, arthritis, and asthma  are also affecting patient's functional outcome.   REHAB POTENTIAL: Good  CLINICAL DECISION MAKING: Evolving/moderate complexity  EVALUATION COMPLEXITY: Moderate   GOALS: Goals reviewed with patient? Yes  SHORT TERM GOALS: Target date: 09/21/23  Patient will be independent with his initial HEP. Baseline: Goal status: MET  2.  Patient will be able to achieve least 125 degrees of passive left shoulder flexion for improved shoulder mobility. Baseline:  Goal status: MET  LONG TERM GOALS: Target date: 10/11/22  Patient will be independent with his advanced HEP. Baseline:  Goal status: INITIAL  2.  Patient will be able to demonstrate at least 125 degrees of active left shoulder flexion for improved function with overhead activities. Baseline:  Goal status: INITIAL  3.  Patient will be able to demonstrate at least 125 degrees of left shoulder abduction for improved function with overhead activities. Baseline:  Goal status: INITIAL  4.  Patient will be able to carry at least 8 pounds with his left upper extremity for improved function with household activities. Baseline:  Goal status: INITIAL  5.  Patient will be able to return to his critical job demands without being limited by his left shoulder. Baseline:  Goal status: INITIAL  PLAN:  PT FREQUENCY: 2x/week  PT DURATION: 6 weeks  PLANNED INTERVENTIONS: 97164- PT Re-evaluation, 97110-Therapeutic exercises, 97530- Therapeutic activity, 97112- Neuromuscular re-education, 97535- Self Care, 74259- Manual therapy, 97014- Electrical  stimulation (unattended), 97016- Vasopneumatic device, Patient/Family education, Taping, Joint mobilization, Cryotherapy, and Moist heat  PLAN FOR NEXT SESSION: Pulleys, isometrics, active assisted range of motion, manual therapy, and modalities as needed.   MD note   Nixie Laube,CHRIS, PTA 09/12/2023, 9:55 AM

## 2023-09-14 ENCOUNTER — Encounter: Payer: Self-pay | Admitting: Orthopedic Surgery

## 2023-09-14 ENCOUNTER — Other Ambulatory Visit: Payer: Self-pay | Admitting: Family Medicine

## 2023-09-14 ENCOUNTER — Ambulatory Visit: Payer: Managed Care, Other (non HMO) | Admitting: Orthopedic Surgery

## 2023-09-14 ENCOUNTER — Ambulatory Visit: Payer: Managed Care, Other (non HMO)

## 2023-09-14 DIAGNOSIS — M25512 Pain in left shoulder: Secondary | ICD-10-CM | POA: Diagnosis not present

## 2023-09-14 DIAGNOSIS — M25612 Stiffness of left shoulder, not elsewhere classified: Secondary | ICD-10-CM

## 2023-09-14 DIAGNOSIS — S46012A Strain of muscle(s) and tendon(s) of the rotator cuff of left shoulder, initial encounter: Secondary | ICD-10-CM

## 2023-09-14 DIAGNOSIS — M6281 Muscle weakness (generalized): Secondary | ICD-10-CM

## 2023-09-14 DIAGNOSIS — G4700 Insomnia, unspecified: Secondary | ICD-10-CM

## 2023-09-14 NOTE — Progress Notes (Signed)
Post-Op Visit Note   Patient: Robert Mcintyre           Date of Birth: 05/11/1966           MRN: [id number] Visit Date: 09/14/2023 PCP: Gilmore Laroche, FNP   Assessment & Plan:  Chief Complaint:  Chief Complaint  Patient presents with   Left Shoulder - Routine Post Op        LEFT SHOULDER SCOPE, deb, BT and MORCTR(surgery date 07-20-23)       Visit Diagnoses:  1. Traumatic incomplete tear of left rotator cuff, initial encounter     Plan: Robert Mcintyre is a patient who underwent left shoulder arthroscopy debridement biceps tenodesis on 07/20/2023.  On exam range of motion is 25/85/130 passively.  External rotation strength is 5 out of 5.  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  He also does a lot of climbing into the cab.  He is not ready for that yet therapy needs to start doing some strengthening exercises and we will see him back in 5 weeks for clinical recheck.  Out of work for at least the next 6 weeks and we can projected return to work date when he comes back in 5 weeks.  He is going to get a pulley to use at home.  Follow-Up Instructions: No follow-ups on file.   Orders:  No orders of the defined types were placed in this encounter.  No orders of the defined types were placed in this encounter.   Imaging: No results found.  PMFS History: Patient Active Problem List   Diagnosis Date Noted   Synovitis of left shoulder 07/25/2023   Biceps tendonitis on left 07/25/2023   Complete tear of left rotator cuff 07/25/2023   Chronic left shoulder pain 02/07/2023   Musculoskeletal pain of upper extremity, left 10/12/2022   Vitamin D deficiency 06/29/2022   Seasonal allergies 06/29/2022   Encounter for routine adult physical exam with abnormal findings 04/22/2022   Fatigue 04/22/2022   Need for varicella vaccine 11/04/2021   Need for immunization against influenza 11/04/2021   Type 2 diabetes mellitus with hyperglycemia, without long-term  current use of insulin (HCC) 11/04/2021   Chronic left-sided low back pain with left-sided sciatica 09/06/2021   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) 06/07/2021   Mass of skin of back 11/13/2020   Hyperlipidemia 10/16/2020   Insomnia, unspecified 10/16/2020   Pure hypercholesterolemia 04/24/2020   Poorly controlled diabetes mellitus (HCC) 01/25/2019   Prostate cancer (HCC) 04/09/2018   Essential hypertension 03/20/2017   Tubular adenoma of colon 09/20/2016   Encounter to establish care    Morbid obesity (HCC) 07/18/2016   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   Past Medical History:  Diagnosis Date   Arthritis    back (had surgery 98), knees   Asthma    uses albuterol during cold weather   Diabetes mellitus without complication (HCC)    Type 2   Hypertension    Morbid obesity (HCC)    Periumbilical hernia    pt unaware   Pneumonia    Prostate cancer (HCC)    prostate cancer around 2018, had surgery   Pure hypercholesterolemia 04/24/2020   Spondylolysis, lumbar region     Family History  Problem Relation Age of Onset   Heart disease Mother    Stroke Mother    Hypertension Mother    Diabetes Mother    Asthma Father  Hypertension Brother     Past Surgical History:  Procedure Laterality Date   COLONOSCOPY N/A 09/16/2016   Procedure: COLONOSCOPY;  Surgeon: West Bali, MD;  [facility];  Service: Endoscopy;  Laterality: N/A;  1:00 PM   LYMPHADENECTOMY Bilateral 04/09/2018   Procedure: LYMPHADENECTOMY;  Surgeon: Heloise Purpura, MD;  Location: [facility];  Service: Urology;  Laterality: Bilateral;   POLYPECTOMY  09/16/2016   Procedure: POLYPECTOMY;  Surgeon: West Bali, MD;  [facility];  Service: Endoscopy;;  ascending colon polyp, hepatic flexure polyp, descending colon polyp,    ROBOT ASSISTED LAPAROSCOPIC RADICAL PROSTATECTOMY N/A 04/09/2018   Procedure: XI ROBOTIC ASSISTED LAPAROSCOPIC RADICAL  PROSTATECTOMY LEVEL 3;  Surgeon: Heloise Purpura, MD;  Location: [facility];  Service: Urology;  Laterality: N/A;  ONLY NEEDS 210 MIN FOR ALL PROCEDURES   SHOULDER ARTHROSCOPY WITH SUBACROMIAL DECOMPRESSION, ROTATOR CUFF REPAIR AND BICEP TENDON REPAIR Left 07/20/2023   Procedure: LEFT SHOULDER ARTHROSCOPY, DEBRIDEMENT, BICEPS TENODESIS, MINI OPEN ROTATOR CUFF TEAR REPAIR;  Surgeon: Cammy Copa, MD;  Location: [facility];  Service: Orthopedics;  Laterality: Left;   SPINE SURGERY  05/1997   back fusion   Social History   Occupational History   Occupation: truck driver  Tobacco Use   Smoking status: Former    Current packs/day: 0.00    Average packs/day: 0.3 packs/day for 3.0 years (0.8 ttl pk-yrs)    Types: Cigarettes    Start date: 35    Quit date: 1980    Years since quitting: 44.9   Smokeless tobacco: Never  Vaping Use   Vaping status: Never Used  Substance and Sexual Activity   Alcohol use: Not Currently    Comment: occasionally   Drug use: No   Sexual activity: Not Currently

## 2023-09-14 NOTE — Therapy (Signed)
OUTPATIENT PHYSICAL THERAPY SHOULDER TREATMENT   Patient Name: Robert Mcintyre MRN: [id number] DOB:Nov 02, 1965, 57 y.o., male Today's Date: 09/14/2023  END OF SESSION:  PT End of Session - 09/14/23 0848     Visit Number 5    Number of Visits 12    Date for PT Re-Evaluation 11/24/23    PT Start Time 0845    PT Stop Time 0928    PT Time Calculation (min) 43 min    Activity Tolerance Patient tolerated treatment well    Behavior During Therapy [facility] for tasks assessed/performed               Past Medical History:  Diagnosis Date   Arthritis    back (had surgery 98), knees   Asthma    uses albuterol during cold weather   Diabetes mellitus without complication (HCC)    Type 2   Hypertension    Morbid obesity (HCC)    Periumbilical hernia    pt unaware   Pneumonia    Prostate cancer (HCC)    prostate cancer around 2018, had surgery   Pure hypercholesterolemia 04/24/2020   Spondylolysis, lumbar region    Past Surgical History:  Procedure Laterality Date   COLONOSCOPY N/A 09/16/2016   Procedure: COLONOSCOPY;  Surgeon: West Bali, MD;  [facility]: [facility];  Service: Endoscopy;  Laterality: N/A;  1:00 PM   LYMPHADENECTOMY Bilateral 04/09/2018   Procedure: LYMPHADENECTOMY;  Surgeon: Heloise Purpura, MD;  Location: [facility];  Service: Urology;  Laterality: Bilateral;   POLYPECTOMY  09/16/2016   Procedure: POLYPECTOMY;  Surgeon: West Bali, MD;  Location: [facility];  Service: Endoscopy;;  ascending colon polyp, hepatic flexure polyp, descending colon polyp,    ROBOT ASSISTED LAPAROSCOPIC RADICAL PROSTATECTOMY N/A 04/09/2018   Procedure: XI ROBOTIC ASSISTED LAPAROSCOPIC RADICAL PROSTATECTOMY LEVEL 3;  Surgeon: Heloise Purpura, MD;  Location: [facility];  Service: Urology;  Laterality: N/A;  ONLY NEEDS 210 MIN FOR ALL PROCEDURES   SHOULDER ARTHROSCOPY WITH SUBACROMIAL DECOMPRESSION, ROTATOR CUFF REPAIR AND BICEP TENDON REPAIR Left 07/20/2023   Procedure: LEFT SHOULDER  ARTHROSCOPY, DEBRIDEMENT, BICEPS TENODESIS, MINI OPEN ROTATOR CUFF TEAR REPAIR;  Surgeon: Cammy Copa, MD;  Location: [facility];  Service: Orthopedics;  Laterality: Left;   SPINE SURGERY  05/1997   back fusion   Patient Active Problem List   Diagnosis Date Noted   Synovitis of left shoulder 07/25/2023   Biceps tendonitis on left 07/25/2023   Complete tear of left rotator cuff 07/25/2023   Chronic left shoulder pain 02/07/2023   Musculoskeletal pain of upper extremity, left 10/12/2022   Vitamin D deficiency 06/29/2022   Seasonal allergies 06/29/2022   Encounter for routine adult physical exam with abnormal findings 04/22/2022   Fatigue 04/22/2022   Need for varicella vaccine 11/04/2021   Need for immunization against influenza 11/04/2021   Type 2 diabetes mellitus with hyperglycemia, without long-term current use of insulin (HCC) 11/04/2021   Chronic left-sided low back pain with left-sided sciatica 09/06/2021   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) 06/07/2021   Mass of skin of back 11/13/2020   Hyperlipidemia 10/16/2020   Insomnia, unspecified 10/16/2020   Pure hypercholesterolemia 04/24/2020   Poorly controlled diabetes mellitus (HCC) 01/25/2019   Prostate cancer (HCC) 04/09/2018   Essential hypertension 03/20/2017   Tubular adenoma of colon 09/20/2016   Encounter to establish care    Morbid obesity (HCC) 07/18/2016   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   REFERRING PROVIDER:  Cammy Copa, MD   REFERRING DIAG: Traumatic incomplete tear of left rotator cuff, initial encounter   THERAPY DIAG:  Acute pain of left shoulder  Stiffness of left shoulder, not elsewhere classified  Muscle weakness (generalized)  Rationale for Evaluation and Treatment: Rehabilitation  ONSET DATE: 07/20/23  SUBJECTIVE:                                                                                                                                                                                       SUBJECTIVE STATEMENT: Patient reports that his shoulder is stiff in the morning.  Hand dominance: Right  PERTINENT HISTORY: Hypertension, diabetes type 2, chronic low back pain, history of prostate cancer, allergies, arthritis, and asthma  PAIN:  Are you having pain? Yes: NPRS scale: 3-4/10 Pain location: left shoulder with intermittent neck pain Pain description: constant sore and aching Aggravating factors: moving his arm the wrong way, getting dressed Relieving factors: rest  PRECAUTIONS: Shoulder  RED FLAGS: None   WEIGHT BEARING RESTRICTIONS: No  FALLS:  Has patient fallen in last 6 months? 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  PATIENT SURVEYS:  FOTO 19.95  COGNITION: Overall cognitive status: Within functional limits for tasks assessed     SENSATION: Patient reports no numbness or tingling.   POSTURE: Forward head  UPPER EXTREMITY ROM:   Active ROM Right eval Left Eval (PROM)   Shoulder flexion 156 109  Shoulder extension    Shoulder abduction 149 90  Shoulder adduction    Shoulder internal rotation To T 8   Shoulder external rotation To T3   Elbow flexion    Elbow extension    Wrist flexion    Wrist extension    Wrist ulnar deviation    Wrist radial deviation    Wrist pronation    Wrist supination    (Blank rows = not tested)  UPPER EXTREMITY MMT: not tested due to surgical condition  SHOULDER SPECIAL TESTS: Not tested due to surgical condition  PALPATION:  TTP: left upper trapezius, supraspinatus, scapular stabilizers, deltoid, pectoralis major,  and subscapularis   TODAY'S TREATMENT:  DATE:                                    09/14/23 EXERCISE LOG  Exercise Repetitions and Resistance Comments  Pulleys 6 minutes   Wall ladder  2 minutes  Max #18  UE ranger  5 minutes  Flexion and CW/CCW           Blank cell = exercise not performed today  Manual Therapy Soft Tissue Mobilization: left biceps and rotator cuff, for reduced pain and tone Joint Mobilizations: Left AC joint, grade I-III Passive ROM: flexion, ABD,  and ER , to tolerance                                   09/12/23 EXERCISE LOG     LT shldr  Exercise Repetitions and Resistance Comments  Pulleys 6 minutes  Scaption  Ball roll out   AA shoulder flexion  Seated UE ranger X 5 mins   Table slide  Flexion, circles CW, CCW  3x10        Blank cell = exercise not performed today  Discussed and reviewed HEP Manual PROM for LT shldr flexion to 155 degrees, ABD to 135 degrees, and ER to 60 degrees. VASO x 15 mins    LT shldr seated.                                08/31/23 EXERCISE LOG  Exercise Repetitions and Resistance Comments  Scapular retraction  5 reps    Table slides  5 reps Flexion                Blank cell = exercise not performed today    PATIENT EDUCATION: Education details: healing, prognosis, and precautions Person educated: Patient Education method: Explanation Education comprehension: verbalized understanding  HOME EXERCISE PROGRAM: [id number]  ASSESSMENT:  CLINICAL IMPRESSION: Patient was introduced to the wall ladder for improved left shoulder mobility. This was followed by manual therapy for improved left shoulder passive range of motion. Soft tissue mobilization to his left biceps and rotator cuff was able to reduce his familiar tightness. However, he reported that his shoulder felt "a little tender" upon the  conclusion of treatment. He continues to require skilled physical therapy to address his remaining impairments to return to his prior level of function.    OBJECTIVE IMPAIRMENTS: decreased activity tolerance, decreased ROM, decreased strength, hypomobility, impaired flexibility, impaired tone, impaired UE functional use, and pain.   ACTIVITY LIMITATIONS: carrying, lifting, sleeping, bathing, dressing, self feeding, reach over head, and hygiene/grooming  PARTICIPATION LIMITATIONS: meal prep, cleaning, laundry, shopping, community activity, and occupation  PERSONAL FACTORS: Profession, Time since onset of injury/illness/exacerbation, and 3+ comorbidities: Hypertension, diabetes type 2, chronic low back pain, history of prostate cancer, allergies, arthritis, and asthma  are also affecting patient's functional outcome.   REHAB POTENTIAL: Good  CLINICAL DECISION MAKING: Evolving/moderate complexity  EVALUATION COMPLEXITY: Moderate   GOALS: Goals reviewed with patient? Yes  SHORT TERM GOALS: Target date: 09/21/23  Patient will be independent with his initial HEP. Baseline: Goal status: MET  2.  Patient will be able to achieve least 125 degrees of passive left shoulder flexion for improved shoulder mobility. Baseline:  Goal status: MET  LONG TERM GOALS: Target date: 10/11/22  Patient will  be independent with his advanced HEP. Baseline:  Goal status: INITIAL  2.  Patient will be able to demonstrate at least 125 degrees of active left shoulder flexion for improved function with overhead activities. Baseline:  Goal status: INITIAL  3.  Patient will be able to demonstrate at least 125 degrees of left shoulder abduction for improved function with overhead activities. Baseline:  Goal status: INITIAL  4.  Patient will be able to carry at least 8 pounds with his left upper extremity for improved function with household activities. Baseline:  Goal status: INITIAL  5.  Patient will be  able to return to his critical job demands without being limited by his left shoulder. Baseline:  Goal status: INITIAL  PLAN:  PT FREQUENCY: 2x/week  PT DURATION: 6 weeks  PLANNED INTERVENTIONS: 97164- PT Re-evaluation, 97110-Therapeutic exercises, 97530- Therapeutic activity, 97112- Neuromuscular re-education, 97535- Self Care, 29562- Manual therapy, 97014- Electrical stimulation (unattended), 97016- Vasopneumatic device, Patient/Family education, Taping, Joint mobilization, Cryotherapy, and Moist heat  PLAN FOR NEXT SESSION: Pulleys, isometrics, active assisted range of motion, manual therapy, and modalities as needed.   MD note   Granville Lewis, PT 09/14/2023, 12:36 PM

## 2023-09-18 ENCOUNTER — Ambulatory Visit: Payer: Managed Care, Other (non HMO) | Admitting: *Deleted

## 2023-09-18 ENCOUNTER — Encounter: Payer: Self-pay | Admitting: *Deleted

## 2023-09-18 DIAGNOSIS — M25512 Pain in left shoulder: Secondary | ICD-10-CM | POA: Diagnosis not present

## 2023-09-18 DIAGNOSIS — M25612 Stiffness of left shoulder, not elsewhere classified: Secondary | ICD-10-CM

## 2023-09-18 DIAGNOSIS — M6281 Muscle weakness (generalized): Secondary | ICD-10-CM

## 2023-09-18 MED ORDER — QUETIAPINE FUMARATE 50 MG PO TABS: 50.0000 mg | ORAL_TABLET | Freq: Every day | ORAL | 0 refills | Status: DC

## 2023-09-18 NOTE — Therapy (Signed)
OUTPATIENT PHYSICAL THERAPY SHOULDER TREATMENT   Patient Name: Robert Mcintyre MRN: [id number] DOB:Jan 01, 1966, 57 y.o., male Today's Date: 09/18/2023  END OF SESSION:  PT End of Session - 09/18/23 1024     Visit Number 6    Number of Visits 12    Date for PT Re-Evaluation 11/24/23    PT Start Time 1015    PT Stop Time 1105    PT Time Calculation (min) 50 min               Past Medical History:  Diagnosis Date   Arthritis    back (had surgery 98), knees   Asthma    uses albuterol during cold weather   Diabetes mellitus without complication (HCC)    Type 2   Hypertension    Morbid obesity (HCC)    Periumbilical hernia    pt unaware   Pneumonia    Prostate cancer (HCC)    prostate cancer around 2018, had surgery   Pure hypercholesterolemia 04/24/2020   Spondylolysis, lumbar region    Past Surgical History:  Procedure Laterality Date   COLONOSCOPY N/A 09/16/2016   Procedure: COLONOSCOPY;  Surgeon: West Bali, MD;  [facility];  Service: Endoscopy;  Laterality: N/A;  1:00 PM   LYMPHADENECTOMY Bilateral 04/09/2018   Procedure: LYMPHADENECTOMY;  Surgeon: Heloise Purpura, MD;  Location: [facility];  Service: Urology;  Laterality: Bilateral;   POLYPECTOMY  09/16/2016   Procedure: POLYPECTOMY;  Surgeon: West Bali, MD;  [facility]: [facility];  Service: Endoscopy;;  ascending colon polyp, hepatic flexure polyp, descending colon polyp,    ROBOT ASSISTED LAPAROSCOPIC RADICAL PROSTATECTOMY N/A 04/09/2018   Procedure: XI ROBOTIC ASSISTED LAPAROSCOPIC RADICAL PROSTATECTOMY LEVEL 3;  Surgeon: Heloise Purpura, MD;  Location: [facility];  Service: Urology;  Laterality: N/A;  ONLY NEEDS 210 MIN FOR ALL PROCEDURES   SHOULDER ARTHROSCOPY WITH SUBACROMIAL DECOMPRESSION, ROTATOR CUFF REPAIR AND BICEP TENDON REPAIR Left 07/20/2023   Procedure: LEFT SHOULDER ARTHROSCOPY, DEBRIDEMENT, BICEPS TENODESIS, MINI OPEN ROTATOR CUFF TEAR REPAIR;  Surgeon: Cammy Copa, MD;   Location: [facility];  Service: Orthopedics;  Laterality: Left;   SPINE SURGERY  05/1997   back fusion   Patient Active Problem List   Diagnosis Date Noted   Synovitis of left shoulder 07/25/2023   Biceps tendonitis on left 07/25/2023   Complete tear of left rotator cuff 07/25/2023   Chronic left shoulder pain 02/07/2023   Musculoskeletal pain of upper extremity, left 10/12/2022   Vitamin D deficiency 06/29/2022   Seasonal allergies 06/29/2022   Encounter for routine adult physical exam with abnormal findings 04/22/2022   Fatigue 04/22/2022   Need for varicella vaccine 11/04/2021   Need for immunization against influenza 11/04/2021   Type 2 diabetes mellitus with hyperglycemia, without long-term current use of insulin (HCC) 11/04/2021   Chronic left-sided low back pain with left-sided sciatica 09/06/2021   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) 06/07/2021   Mass of skin of back 11/13/2020   Hyperlipidemia 10/16/2020   Insomnia, unspecified 10/16/2020   Pure hypercholesterolemia 04/24/2020   Poorly controlled diabetes mellitus (HCC) 01/25/2019   Prostate cancer (HCC) 04/09/2018   Essential hypertension 03/20/2017   Tubular adenoma of colon 09/20/2016   Encounter to establish care    Morbid obesity (HCC) 07/18/2016   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   REFERRING PROVIDER: Cammy Copa, MD   REFERRING DIAG: Traumatic incomplete tear of left rotator cuff, initial encounter  THERAPY DIAG:  Acute pain of left shoulder  Stiffness of left shoulder, not elsewhere classified  Muscle weakness (generalized)  Rationale for Evaluation and Treatment: Rehabilitation  ONSET DATE: 07/20/23  SUBJECTIVE:                                                                                                                                                                                      SUBJECTIVE STATEMENT: Patient reports that his shoulder  LT shldr. 4-5/10. MD pleased. Start light strengthening Hand dominance: Right  PERTINENT HISTORY: Hypertension, diabetes type 2, chronic low back pain, history of prostate cancer, allergies, arthritis, and asthma  PAIN:  Are you having pain? Yes: NPRS scale: 3-4/10 Pain location: left shoulder with intermittent neck pain Pain description: constant sore and aching Aggravating factors: moving his arm the wrong way, getting dressed Relieving factors: rest  PRECAUTIONS: Shoulder  RED FLAGS: None   WEIGHT BEARING RESTRICTIONS: No  FALLS:  Has patient fallen in last 6 months? 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  PATIENT SURVEYS:  FOTO 19.95  COGNITION: Overall cognitive status: Within functional limits for tasks assessed     SENSATION: Patient reports no numbness or tingling.   POSTURE: Forward head  UPPER EXTREMITY ROM:   Active ROM Right eval Left Eval (PROM)   Shoulder flexion 156 109  Shoulder extension    Shoulder abduction 149 90  Shoulder adduction    Shoulder internal rotation To T 8   Shoulder external rotation To T3   Elbow flexion    Elbow extension    Wrist flexion    Wrist extension    Wrist ulnar deviation    Wrist radial deviation    Wrist pronation    Wrist supination    (Blank rows = not tested)  UPPER EXTREMITY MMT: not tested due to surgical condition  SHOULDER SPECIAL TESTS: Not tested due to surgical condition  PALPATION:  TTP: left upper trapezius, supraspinatus, scapular stabilizers, deltoid, pectoralis major, and subscapularis   TODAY'S TREATMENT:  DATE:                                    09/18/23 EXERCISE LOG    LT shldr  Exercise Repetitions and Resistance Comments  Pulleys 6 minutes   Wall ladder  3 minutes  Max #18  UE ranger standing 5 minutes  Flexion and CW/CCW  Ext Yellow tband  3 x 10   Row Yellow tband    3x 10   Supine cane press/flexion X10 each    Blank cell = exercise not performed today  Manual Therapy Soft Tissue Mobilization:  ,   Joint Mobilizations:  ,   Passive ROM: flexion, ABD,  and ER , to tolerance                                   09/12/23 EXERCISE LOG     LT shldr  Exercise Repetitions and Resistance Comments  Pulleys 6 minutes  Scaption  Ball roll out   AA shoulder flexion  Seated UE ranger X 5 mins   Table slide  Flexion, circles CW, CCW  3x10        Blank cell = exercise not performed today  Discussed and reviewed HEP Manual PROM for LT shldr flexion to 155 degrees, ABD to 135 degrees, and ER to 60 degrees. VASO x 15 mins    LT shldr seated.                                08/31/23 EXERCISE LOG  Exercise Repetitions and Resistance Comments  Scapular retraction  5 reps    Table slides  5 reps Flexion                Blank cell = exercise not performed today    PATIENT EDUCATION: Education details: healing, prognosis, and precautions Person educated: Patient Education method: Explanation Education comprehension: verbalized understanding  HOME EXERCISE PROGRAM: [id number]  ASSESSMENT:  CLINICAL IMPRESSION: Patient  arrives today after f/u with MD and states MD said he could start light strengthening LT shldr. He was able to progress therex with standing UE  ranger and light tband strengthening for rows and extension arm close to the body still. Pt did well without increased pain.     OBJECTIVE IMPAIRMENTS: decreased activity tolerance, decreased ROM, decreased strength, hypomobility, impaired  flexibility, impaired tone, impaired UE functional use, and pain.   ACTIVITY LIMITATIONS: carrying, lifting, sleeping, bathing, dressing, self feeding, reach over head, and hygiene/grooming  PARTICIPATION LIMITATIONS: meal prep, cleaning, laundry, shopping, community activity, and occupation  PERSONAL FACTORS: Profession, Time since onset of injury/illness/exacerbation, and 3+ comorbidities: Hypertension, diabetes type 2, chronic low back pain, history of prostate cancer, allergies, arthritis, and asthma  are also affecting patient's functional outcome.   REHAB POTENTIAL: Good  CLINICAL DECISION MAKING: Evolving/moderate complexity  EVALUATION COMPLEXITY: Moderate   GOALS: Goals reviewed with patient? Yes  SHORT TERM GOALS: Target date: 09/21/23  Patient will be independent with his initial HEP. Baseline: Goal status: MET  2.  Patient will be able to achieve least 125 degrees of passive left shoulder flexion for improved shoulder mobility. Baseline:  Goal status: MET  LONG TERM GOALS: Target date: 10/11/22  Patient will be independent with his advanced HEP. Baseline:  Goal status: INITIAL  2.  Patient will be able to demonstrate at least 125 degrees of active left shoulder flexion for improved function with overhead activities. Baseline:  Goal status: INITIAL  3.  Patient will be able to demonstrate at least 125 degrees of left shoulder abduction for improved function with overhead activities. Baseline:  Goal status: INITIAL  4.  Patient will be able to carry at least 8 pounds with his left upper extremity for improved function with household activities. Baseline:  Goal status: INITIAL  5.  Patient will be able to return to his critical job demands without being limited by his left shoulder. Baseline:  Goal status: INITIAL  PLAN:  PT FREQUENCY: 2x/week  PT DURATION: 6 weeks  PLANNED INTERVENTIONS: 97164- PT Re-evaluation, 97110-Therapeutic exercises, 97530-  Therapeutic activity, 97112- Neuromuscular re-education, 97535- Self Care, 29562- Manual therapy, 97014- Electrical stimulation (unattended), 97016- Vasopneumatic device, Patient/Family education, Taping, Joint mobilization, Cryotherapy, and Moist heat  PLAN FOR NEXT SESSION: Pulleys, isometrics, active assisted range of motion, manual therapy, and modalities as needed.  Light strengthening as per MD   Avion Patella,CHRIS, PTA 09/18/2023, 1:16 PM

## 2023-09-26 ENCOUNTER — Ambulatory Visit: Payer: Managed Care, Other (non HMO) | Admitting: *Deleted

## 2023-09-26 ENCOUNTER — Encounter: Payer: Self-pay | Admitting: *Deleted

## 2023-09-26 DIAGNOSIS — M6281 Muscle weakness (generalized): Secondary | ICD-10-CM

## 2023-09-26 DIAGNOSIS — M25512 Pain in left shoulder: Secondary | ICD-10-CM

## 2023-09-26 DIAGNOSIS — M25612 Stiffness of left shoulder, not elsewhere classified: Secondary | ICD-10-CM

## 2023-09-26 NOTE — Therapy (Signed)
 OUTPATIENT PHYSICAL THERAPY SHOULDER TREATMENT   Patient Name: Robert Mcintyre MRN: [id number] DOB:09/19/1966, 57 y.o., male Today's Date: 09/26/2023  END OF SESSION:  PT End of Session - 09/26/23 0856     Visit Number 7    Number of Visits 12    Date for PT Re-Evaluation 11/24/23    PT Start Time 0845    PT Stop Time 0933    PT Time Calculation (min) 48 min               Past Medical History:  Diagnosis Date   Arthritis    back (had surgery 98), knees   Asthma    uses albuterol  during cold weather   Diabetes mellitus without complication (HCC)    Type 2   Hypertension    Morbid obesity (HCC)    Periumbilical hernia    pt unaware   Pneumonia    Prostate cancer (HCC)    prostate cancer around 2018, had surgery   Pure hypercholesterolemia 04/24/2020   Spondylolysis, lumbar region    Past Surgical History:  Procedure Laterality Date   COLONOSCOPY N/A 09/16/2016   Procedure: COLONOSCOPY;  Surgeon: Margo LITTIE Haddock, MD;  Location: [facility];  Service: Endoscopy;  Laterality: N/A;  1:00 PM   LYMPHADENECTOMY Bilateral 04/09/2018   Procedure: LYMPHADENECTOMY;  Surgeon: Renda Glance, MD;  Location: [facility];  Service: Urology;  Laterality: Bilateral;   POLYPECTOMY  09/16/2016   Procedure: POLYPECTOMY;  Surgeon: Margo LITTIE Haddock, MD;  Location: [facility];  Service: Endoscopy;;  ascending colon polyp, hepatic flexure polyp, descending colon polyp,    ROBOT ASSISTED LAPAROSCOPIC RADICAL PROSTATECTOMY N/A 04/09/2018   Procedure: XI ROBOTIC ASSISTED LAPAROSCOPIC RADICAL PROSTATECTOMY LEVEL 3;  Surgeon: Renda Glance, MD;  Location: [facility];  Service: Urology;  Laterality: N/A;  ONLY NEEDS 210 MIN FOR ALL PROCEDURES   SHOULDER ARTHROSCOPY WITH SUBACROMIAL DECOMPRESSION, ROTATOR CUFF REPAIR AND BICEP TENDON REPAIR Left 07/20/2023   Procedure: LEFT SHOULDER ARTHROSCOPY, DEBRIDEMENT, BICEPS TENODESIS, MINI OPEN ROTATOR CUFF TEAR REPAIR;  Surgeon: Addie Cordella Hamilton, MD;   Location: [facility];  Service: Orthopedics;  Laterality: Left;   SPINE SURGERY  05/1997   back fusion   Patient Active Problem List   Diagnosis Date Noted   Synovitis of left shoulder 07/25/2023   Biceps tendonitis on left 07/25/2023   Complete tear of left rotator cuff 07/25/2023   Chronic left shoulder pain 02/07/2023   Musculoskeletal pain of upper extremity, left 10/12/2022   Vitamin D  deficiency 06/29/2022   Seasonal allergies 06/29/2022   Encounter for routine adult physical exam with abnormal findings 04/22/2022   Fatigue 04/22/2022   Need for varicella vaccine 11/04/2021   Need for immunization against influenza 11/04/2021   Type 2 diabetes mellitus with hyperglycemia, without long-term current use of insulin  (HCC) 11/04/2021   Chronic left-sided low back pain with left-sided sciatica 09/06/2021   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) 06/07/2021   Mass of skin of back 11/13/2020   Hyperlipidemia 10/16/2020   Insomnia, unspecified 10/16/2020   Pure hypercholesterolemia 04/24/2020   Poorly controlled diabetes mellitus (HCC) 01/25/2019   Prostate cancer (HCC) 04/09/2018   Essential hypertension 03/20/2017   Tubular adenoma of colon 09/20/2016   Encounter to establish care    Morbid obesity (HCC) 07/18/2016   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   REFERRING PROVIDER: Addie Cordella Hamilton, MD   REFERRING DIAG: Traumatic incomplete tear of left rotator cuff, initial encounter  THERAPY DIAG:  Acute pain of left shoulder  Stiffness of left shoulder, not elsewhere classified  Muscle weakness (generalized)  Rationale for Evaluation and Treatment: Rehabilitation  ONSET DATE: 07/20/23  SUBJECTIVE:                                                                                                                                                                                      SUBJECTIVE STATEMENT: Patient reports that his shoulder  LT shldr. 3-4/10. Start light strengthening Hand dominance: Right  PERTINENT HISTORY: Hypertension, diabetes type 2, chronic low back pain, history of prostate cancer, allergies, arthritis, and asthma  PAIN:  Are you having pain? Yes: NPRS scale: 3-4/10 Pain location: left shoulder with intermittent neck pain Pain description: constant sore and aching Aggravating factors: moving his arm the wrong way, getting dressed Relieving factors: rest  PRECAUTIONS: Shoulder  RED FLAGS: None   WEIGHT BEARING RESTRICTIONS: No  FALLS:  Has patient fallen in last 6 months? 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  PATIENT SURVEYS:  FOTO 19.95  COGNITION: Overall cognitive status: Within functional limits for tasks assessed     SENSATION: Patient reports no numbness or tingling.   POSTURE: Forward head  UPPER EXTREMITY ROM:   Active ROM Right eval Left Eval (PROM)   Shoulder flexion 156 109  Shoulder extension    Shoulder abduction 149 90  Shoulder adduction    Shoulder internal rotation To T 8   Shoulder external rotation To T3   Elbow flexion    Elbow extension    Wrist flexion    Wrist extension    Wrist ulnar deviation    Wrist radial deviation    Wrist pronation    Wrist supination    (Blank rows = not tested)  UPPER EXTREMITY MMT: not tested due to surgical condition  SHOULDER SPECIAL TESTS: Not tested due to surgical condition  PALPATION:  TTP: left upper trapezius, supraspinatus, scapular stabilizers, deltoid, pectoralis major, and subscapularis   TODAY'S TREATMENT:  DATE:                                    09/26/23 EXERCISE LOG    LT shldr  Exercise Repetitions and Resistance Comments  Pulleys 6 minutes      flexion/ABD   Wall ladder  3 minutes  Max #18  UE ranger standing 5 minutes  Flexion and CW/CCW  Ext Yellow tband  3 x 10   Row Yellow tband    3x 10   Supine press/flexion AROM 3 X10 each   ER yellow Both UE same time  x10 HEP   Blank cell = exercise not performed today  Manual Therapy   Standing AAROM for elevation  x10 Soft Tissue Mobilization:  ,   Joint Mobilizations:  ,   Passive ROM: flexion, ABD,  and ER , to tolerance                                   09/12/23 EXERCISE LOG     LT shldr  Exercise Repetitions and Resistance Comments  Pulleys 6 minutes  Scaption  Ball roll out   AA shoulder flexion  Seated UE ranger X 5 mins   Table slide  Flexion, circles CW, CCW  3x10        Blank cell = exercise not performed today  Discussed and reviewed HEP Manual PROM for LT shldr flexion to 155 degrees, ABD to 135 degrees, and ER to 60 degrees. VASO x 15 mins    LT shldr seated.                                08/31/23 EXERCISE LOG  Exercise Repetitions and Resistance Comments  Scapular retraction  5 reps    Table slides  5 reps Flexion                Blank cell = exercise not performed today    PATIENT EDUCATION: Education details: healing, prognosis, and precautions Person educated: Patient Education method: Explanation Education comprehension: verbalized understanding  HOME EXERCISE PROGRAM: [id number]  ASSESSMENT:  CLINICAL IMPRESSION:FOTO performed  Patient  arrives today with 3-4/10 LT shldr soreness. He was able to continue with AAROM, AROM and  light strengthening LT shldr. And did well   OBJECTIVE IMPAIRMENTS: decreased activity tolerance, decreased ROM, decreased strength, hypomobility, impaired flexibility, impaired tone, impaired UE  functional use, and pain.   ACTIVITY LIMITATIONS: carrying, lifting, sleeping, bathing, dressing, self feeding, reach over head, and hygiene/grooming  PARTICIPATION LIMITATIONS: meal prep, cleaning, laundry, shopping, community activity, and occupation  PERSONAL FACTORS: Profession, Time since onset of injury/illness/exacerbation, and 3+ comorbidities: Hypertension, diabetes type 2, chronic low back pain, history of prostate cancer, allergies, arthritis, and asthma  are also affecting patient's functional outcome.   REHAB POTENTIAL: Good  CLINICAL DECISION MAKING: Evolving/moderate complexity  EVALUATION COMPLEXITY: Moderate   GOALS: Goals reviewed with patient? Yes  SHORT TERM GOALS: Target date: 09/21/23  Patient will be independent with his initial HEP. Baseline: Goal status: MET  2.  Patient will be able to achieve least 125 degrees of passive left shoulder flexion for improved shoulder mobility. Baseline:  Goal status: MET  LONG TERM GOALS: Target date: 10/11/22  Patient will be independent with his advanced HEP. Baseline:  Goal status:  INITIAL  2.  Patient will be able to demonstrate at least 125 degrees of active left shoulder flexion for improved function with overhead activities. Baseline:  Goal status: INITIAL  3.  Patient will be able to demonstrate at least 125 degrees of left shoulder abduction for improved function with overhead activities. Baseline:  Goal status: INITIAL  4.  Patient will be able to carry at least 8 pounds with his left upper extremity for improved function with household activities. Baseline:  Goal status: INITIAL  5.  Patient will be able to return to his critical job demands without being limited by his left shoulder. Baseline:  Goal status: INITIAL  PLAN:  PT FREQUENCY: 2x/week  PT DURATION: 6 weeks  PLANNED INTERVENTIONS: 97164- PT Re-evaluation, 97110-Therapeutic exercises, 97530- Therapeutic activity, 97112- Neuromuscular  re-education, 97535- Self Care, 02859- Manual therapy, 97014- Electrical stimulation (unattended), 97016- Vasopneumatic device, Patient/Family education, Taping, Joint mobilization, Cryotherapy, and Moist heat       no vaso  PLAN FOR NEXT SESSION: Pulleys, isometrics, active assisted range of motion, manual therapy, and modalities as needed.  Light strengthening as per MD   Philmore Lepore,CHRIS, PTA 09/26/2023, 9:46 AM

## 2023-09-28 ENCOUNTER — Encounter: Payer: Managed Care, Other (non HMO) | Admitting: *Deleted

## 2023-10-04 ENCOUNTER — Ambulatory Visit: Payer: Managed Care, Other (non HMO) | Attending: Orthopedic Surgery

## 2023-10-04 DIAGNOSIS — M6281 Muscle weakness (generalized): Secondary | ICD-10-CM | POA: Diagnosis present

## 2023-10-04 DIAGNOSIS — M25612 Stiffness of left shoulder, not elsewhere classified: Secondary | ICD-10-CM | POA: Diagnosis present

## 2023-10-04 DIAGNOSIS — M25512 Pain in left shoulder: Secondary | ICD-10-CM | POA: Insufficient documentation

## 2023-10-04 NOTE — Therapy (Signed)
 OUTPATIENT PHYSICAL THERAPY SHOULDER TREATMENT   Patient Name: Robert Mcintyre MRN: [id number] DOB:[birth year], 58 y.o., male Today's Date: 10/04/2023  END OF SESSION:  PT End of Session - 10/04/23 1348     Visit Number 8    Number of Visits 12    Date for PT Re-Evaluation 11/24/23    PT Start Time 1345    PT Stop Time 1435    PT Time Calculation (min) 50 min               Past Medical History:  Diagnosis Date   Arthritis    back (had surgery 98), knees   Asthma    uses albuterol  during cold weather   Diabetes mellitus without complication (HCC)    Type 2   Hypertension    Morbid obesity (HCC)    Periumbilical hernia    pt unaware   Pneumonia    Prostate cancer (HCC)    prostate cancer around 2018, had surgery   Pure hypercholesterolemia 04/24/2020   Spondylolysis, lumbar region    Past Surgical History:  Procedure Laterality Date   COLONOSCOPY N/A 09/16/2016   Procedure: COLONOSCOPY;  Surgeon: Margo LITTIE Haddock, MD;  Location: [facility];  Service: Endoscopy;  Laterality: N/A;  1:00 PM   LYMPHADENECTOMY Bilateral 04/09/2018   Procedure: LYMPHADENECTOMY;  Surgeon: Renda Glance, MD;  Location: [facility];  Service: Urology;  Laterality: Bilateral;   POLYPECTOMY  09/16/2016   Procedure: POLYPECTOMY;  Surgeon: Margo LITTIE Haddock, MD;  Location: [facility];  Service: Endoscopy;;  ascending colon polyp, hepatic flexure polyp, descending colon polyp,    ROBOT ASSISTED LAPAROSCOPIC RADICAL PROSTATECTOMY N/A 04/09/2018   Procedure: XI ROBOTIC ASSISTED LAPAROSCOPIC RADICAL PROSTATECTOMY LEVEL 3;  Surgeon: Renda Glance, MD;  Location: [facility];  Service: Urology;  Laterality: N/A;  ONLY NEEDS 210 MIN FOR ALL PROCEDURES   SHOULDER ARTHROSCOPY WITH SUBACROMIAL DECOMPRESSION, ROTATOR CUFF REPAIR AND BICEP TENDON REPAIR Left 07/20/2023   Procedure: LEFT SHOULDER ARTHROSCOPY, DEBRIDEMENT, BICEPS TENODESIS, MINI OPEN ROTATOR CUFF TEAR REPAIR;  Surgeon: Addie Cordella Hamilton, MD;  Location:  [facility];  Service: Orthopedics;  Laterality: Left;   SPINE SURGERY  05/1997   back fusion   Patient Active Problem List   Diagnosis Date Noted   Synovitis of left shoulder 07/25/2023   Biceps tendonitis on left 07/25/2023   Complete tear of left rotator cuff 07/25/2023   Chronic left shoulder pain 02/07/2023   Musculoskeletal pain of upper extremity, left 10/12/2022   Vitamin D  deficiency 06/29/2022   Seasonal allergies 06/29/2022   Encounter for routine adult physical exam with abnormal findings 04/22/2022   Fatigue 04/22/2022   Need for varicella vaccine 11/04/2021   Need for immunization against influenza 11/04/2021   Type 2 diabetes mellitus with hyperglycemia, without long-term current use of insulin  (HCC) 11/04/2021   Chronic left-sided low back pain with left-sided sciatica 09/06/2021   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) 06/07/2021   Mass of skin of back 11/13/2020   Hyperlipidemia 10/16/2020   Insomnia, unspecified 10/16/2020   Pure hypercholesterolemia 04/24/2020   Poorly controlled diabetes mellitus (HCC) 01/25/2019   Prostate cancer (HCC) 04/09/2018   Essential hypertension 03/20/2017   Tubular adenoma of colon 09/20/2016   Encounter to establish care    Morbid obesity (HCC) 07/18/2016   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   REFERRING PROVIDER: Addie Cordella Hamilton, MD   REFERRING DIAG: Traumatic incomplete tear of left rotator cuff, initial encounter  THERAPY DIAG:  Acute pain of left shoulder  Stiffness of left shoulder, not elsewhere classified  Muscle weakness (generalized)  Rationale for Evaluation and Treatment: Rehabilitation  ONSET DATE: 07/20/23  SUBJECTIVE:                                                                                                                                                                                      SUBJECTIVE STATEMENT: Patient reports 2/10 left shoulder  pain.  Hand dominance: Right  PERTINENT HISTORY: Hypertension, diabetes type 2, chronic low back pain, history of prostate cancer, allergies, arthritis, and asthma  PAIN:  Are you having pain? Yes: NPRS scale: 2/10 Pain location: left shoulder with intermittent neck pain Pain description: constant sore and aching Aggravating factors: moving his arm the wrong way, getting dressed Relieving factors: rest  PRECAUTIONS: Shoulder  RED FLAGS: None   WEIGHT BEARING RESTRICTIONS: No  FALLS:  Has patient fallen in last 6 months? 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  PATIENT SURVEYS:  FOTO 19.95  COGNITION: Overall cognitive status: Within functional limits for tasks assessed     SENSATION: Patient reports no numbness or tingling.   POSTURE: Forward head  UPPER EXTREMITY ROM:   Active ROM Right eval Left Eval (PROM)   Shoulder flexion 156 109  Shoulder extension    Shoulder abduction 149 90  Shoulder adduction    Shoulder internal rotation To T 8   Shoulder external rotation To T3   Elbow flexion    Elbow extension    Wrist flexion    Wrist extension    Wrist ulnar deviation    Wrist radial deviation    Wrist pronation    Wrist supination    (Blank rows = not tested)  UPPER EXTREMITY MMT: not tested due to surgical condition  SHOULDER SPECIAL TESTS: Not tested due to surgical condition  PALPATION:  TTP: left upper trapezius, supraspinatus, scapular stabilizers, deltoid, pectoralis major, and subscapularis   TODAY'S TREATMENT:  DATE:                           10/04/23  EXERCISE LOG    LT shldr  Exercise Repetitions and Resistance Comments  Pulleys 6 minutes      flexion/ABD   Wall ladder  4 minutes  Max #18  UE ranger standing 4 minutes  Flexion and CW/CCW  Ext Yellow tband 3 x 10   Row Yellow tband 3 x 10   Supine press/flexion AROM 3 x 10 each   ER yellow Both UE same time x20 HEP   Blank cell = exercise not performed today   Modalities  Date:  Unattended Estim: Shoulder, IFC 80-105 Hz, 15 mins, Pain and Tone                                    09/12/23 EXERCISE LOG     LT shldr  Exercise Repetitions and Resistance Comments  Pulleys 6 minutes  Scaption  Ball roll out   AA shoulder flexion  Seated UE ranger X 5 mins   Table slide  Flexion, circles CW, CCW  3x10        Blank cell = exercise not performed today  Discussed and reviewed HEP Manual PROM for LT shldr flexion to 155 degrees, ABD to 135 degrees, and ER to 60 degrees. VASO x 15 mins    LT shldr seated.                                PATIENT EDUCATION: Education details: healing, prognosis, and precautions Person educated: Patient Education method: Explanation Education comprehension: verbalized understanding  HOME EXERCISE PROGRAM: [id number]  ASSESSMENT:  CLINICAL IMPRESSION:  Pt arrives for today's treatment session 2/10 left shoulder pain.  Pt reports increased pain to 3-4/10 with standing UE ranger use.  Pt requiring min cues with thera-band exercises for proper technique and to avoid compensatory movements.  Normal responses to estim noted upon removal.  Pt reported decreased pain at completion of today's treatment session.  OBJECTIVE IMPAIRMENTS: decreased activity tolerance, decreased ROM, decreased strength, hypomobility, impaired flexibility, impaired tone, impaired UE functional use, and pain.   ACTIVITY LIMITATIONS: carrying, lifting, sleeping, bathing, dressing, self feeding, reach  over head, and hygiene/grooming  PARTICIPATION LIMITATIONS: meal prep, cleaning, laundry, shopping, community activity, and occupation  PERSONAL FACTORS: Profession, Time since onset of injury/illness/exacerbation, and 3+ comorbidities: Hypertension, diabetes type 2, chronic low back pain, history of prostate cancer, allergies, arthritis, and asthma  are also affecting patient's functional outcome.   REHAB POTENTIAL: Good  CLINICAL DECISION MAKING: Evolving/moderate complexity  EVALUATION COMPLEXITY: Moderate   GOALS: Goals reviewed with patient? Yes  SHORT TERM GOALS: Target date: 09/21/23  Patient will be independent with his initial HEP. Baseline: Goal status: MET  2.  Patient will be able to achieve least 125 degrees of passive left shoulder flexion for improved shoulder mobility. Baseline:  Goal status: MET  LONG TERM GOALS: Target date: 10/11/22  Patient will be independent with his advanced HEP. Baseline:  Goal status: IN PROGRESS  2.  Patient will be able to demonstrate at least 125 degrees of active left shoulder flexion for improved function with overhead activities. Baseline:  Goal status: IN PROGRESS  3.  Patient will be able to demonstrate at least 125 degrees of left  shoulder abduction for improved function with overhead activities. Baseline:  Goal status: IN PROGRESS  4.  Patient will be able to carry at least 8 pounds with his left upper extremity for improved function with household activities. Baseline:  Goal status: IN PROGRESS  5.  Patient will be able to return to his critical job demands without being limited by his left shoulder. Baseline:  Goal status: IN PROGRESS  PLAN:  PT FREQUENCY: 2x/week  PT DURATION: 6 weeks  PLANNED INTERVENTIONS: 97164- PT Re-evaluation, 97110-Therapeutic exercises, 97530- Therapeutic activity, 97112- Neuromuscular re-education, 97535- Self Care, 02859- Manual therapy, 97014- Electrical stimulation (unattended),  97016- Vasopneumatic device, Patient/Family education, Taping, Joint mobilization, Cryotherapy, and Moist heat       no vaso  PLAN FOR NEXT SESSION: Pulleys, isometrics, active assisted range of motion, manual therapy, and modalities as needed.  Light strengthening as per MD   Delon DELENA Gosling, PTA 10/04/2023, 3:39 PM

## 2023-10-06 ENCOUNTER — Ambulatory Visit: Payer: Managed Care, Other (non HMO)

## 2023-10-06 DIAGNOSIS — M6281 Muscle weakness (generalized): Secondary | ICD-10-CM

## 2023-10-06 DIAGNOSIS — M25612 Stiffness of left shoulder, not elsewhere classified: Secondary | ICD-10-CM

## 2023-10-06 DIAGNOSIS — M25512 Pain in left shoulder: Secondary | ICD-10-CM

## 2023-10-06 NOTE — Therapy (Signed)
 OUTPATIENT PHYSICAL THERAPY SHOULDER TREATMENT   Patient Name: Robert Mcintyre MRN: [id number] DOB:[birth year], 58 y.o., male Today's Date: 10/06/2023  END OF SESSION:  PT End of Session - 10/06/23 1146     Visit Number 9    Number of Visits 12    Date for PT Re-Evaluation 11/24/23    PT Start Time 1145    PT Stop Time 1233    PT Time Calculation (min) 48 min               Past Medical History:  Diagnosis Date   Arthritis    back (had surgery 98), knees   Asthma    uses albuterol  during cold weather   Diabetes mellitus without complication (HCC)    Type 2   Hypertension    Morbid obesity (HCC)    Periumbilical hernia    pt unaware   Pneumonia    Prostate cancer (HCC)    prostate cancer around 2018, had surgery   Pure hypercholesterolemia 04/24/2020   Spondylolysis, lumbar region    Past Surgical History:  Procedure Laterality Date   COLONOSCOPY N/A 09/16/2016   Procedure: COLONOSCOPY;  Surgeon: Margo LITTIE Haddock, MD;  Location: [facility];  Service: Endoscopy;  Laterality: N/A;  1:00 PM   LYMPHADENECTOMY Bilateral 04/09/2018   Procedure: LYMPHADENECTOMY;  Surgeon: Renda Glance, MD;  Location: [facility];  Service: Urology;  Laterality: Bilateral;   POLYPECTOMY  09/16/2016   Procedure: POLYPECTOMY;  Surgeon: Margo LITTIE Haddock, MD;  Location: [facility];  Service: Endoscopy;;  ascending colon polyp, hepatic flexure polyp, descending colon polyp,    ROBOT ASSISTED LAPAROSCOPIC RADICAL PROSTATECTOMY N/A 04/09/2018   Procedure: XI ROBOTIC ASSISTED LAPAROSCOPIC RADICAL PROSTATECTOMY LEVEL 3;  Surgeon: Renda Glance, MD;  Location: [facility];  Service: Urology;  Laterality: N/A;  ONLY NEEDS 210 MIN FOR ALL PROCEDURES   SHOULDER ARTHROSCOPY WITH SUBACROMIAL DECOMPRESSION, ROTATOR CUFF REPAIR AND BICEP TENDON REPAIR Left 07/20/2023   Procedure: LEFT SHOULDER ARTHROSCOPY, DEBRIDEMENT, BICEPS TENODESIS, MINI OPEN ROTATOR CUFF TEAR REPAIR;  Surgeon: Addie Cordella Hamilton, MD;  Location:  [facility];  Service: Orthopedics;  Laterality: Left;   SPINE SURGERY  05/1997   back fusion   Patient Active Problem List   Diagnosis Date Noted   Synovitis of left shoulder 07/25/2023   Biceps tendonitis on left 07/25/2023   Complete tear of left rotator cuff 07/25/2023   Chronic left shoulder pain 02/07/2023   Musculoskeletal pain of upper extremity, left 10/12/2022   Vitamin D  deficiency 06/29/2022   Seasonal allergies 06/29/2022   Encounter for routine adult physical exam with abnormal findings 04/22/2022   Fatigue 04/22/2022   Need for varicella vaccine 11/04/2021   Need for immunization against influenza 11/04/2021   Type 2 diabetes mellitus with hyperglycemia, without long-term current use of insulin  (HCC) 11/04/2021   Chronic left-sided low back pain with left-sided sciatica 09/06/2021   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) 06/07/2021   Mass of skin of back 11/13/2020   Hyperlipidemia 10/16/2020   Insomnia, unspecified 10/16/2020   Pure hypercholesterolemia 04/24/2020   Poorly controlled diabetes mellitus (HCC) 01/25/2019   Prostate cancer (HCC) 04/09/2018   Essential hypertension 03/20/2017   Tubular adenoma of colon 09/20/2016   Encounter to establish care    Morbid obesity (HCC) 07/18/2016   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   REFERRING PROVIDER: Addie Cordella Hamilton, MD   REFERRING DIAG: Traumatic incomplete tear of left rotator cuff, initial encounter  THERAPY DIAG:  Acute pain of left shoulder  Stiffness of left shoulder, not elsewhere classified  Muscle weakness (generalized)  Rationale for Evaluation and Treatment: Rehabilitation  ONSET DATE: 07/20/23  SUBJECTIVE:                                                                                                                                                                                      SUBJECTIVE STATEMENT: Patient reports 2/10 left shoulder  pain.  Hand dominance: Right  PERTINENT HISTORY: Hypertension, diabetes type 2, chronic low back pain, history of prostate cancer, allergies, arthritis, and asthma  PAIN:  Are you having pain? Yes: NPRS scale: 2/10 Pain location: left shoulder with intermittent neck pain Pain description: constant sore and aching Aggravating factors: moving his arm the wrong way, getting dressed Relieving factors: rest  PRECAUTIONS: Shoulder  RED FLAGS: None   WEIGHT BEARING RESTRICTIONS: No  FALLS:  Has patient fallen in last 6 months? 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  PATIENT SURVEYS:  FOTO 19.95  COGNITION: Overall cognitive status: Within functional limits for tasks assessed     SENSATION: Patient reports no numbness or tingling.   POSTURE: Forward head  UPPER EXTREMITY ROM:   Active ROM Right eval Left Eval (PROM)   Shoulder flexion 156 109  Shoulder extension    Shoulder abduction 149 90  Shoulder adduction    Shoulder internal rotation To T 8   Shoulder external rotation To T3   Elbow flexion    Elbow extension    Wrist flexion    Wrist extension    Wrist ulnar deviation    Wrist radial deviation    Wrist pronation    Wrist supination    (Blank rows = not tested)  UPPER EXTREMITY MMT: not tested due to surgical condition  SHOULDER SPECIAL TESTS: Not tested due to surgical condition  PALPATION:  TTP: left upper trapezius, supraspinatus, scapular stabilizers, deltoid, pectoralis major, and subscapularis   TODAY'S TREATMENT:  DATE:                           10/04/23  EXERCISE LOG    LT shldr  Exercise Repetitions and Resistance Comments  Pulleys 6 minutes      flexion/ABD   Wall ladder  4 minutes  Max #18  UE ranger standing 5 minutes  Flexion and CW/CCW  Ext Red tband 3 x 10   Row Red tband 3 x 10   Supine press/flexion AROM    ER red Both UE same time x20 HEP   Blank cell = exercise not performed today   Modalities  Date:  Unattended Estim: Shoulder, IFC 80-105 Hz, 15 mins, Pain and Tone                                    09/12/23 EXERCISE LOG     LT shldr  Exercise Repetitions and Resistance Comments  Pulleys 6 minutes  Scaption  Ball roll out   AA shoulder flexion  Seated UE ranger X 5 mins   Table slide  Flexion, circles CW, CCW  3x10        Blank cell = exercise not performed today  Discussed and reviewed HEP Manual PROM for LT shldr flexion to 155 degrees, ABD to 135 degrees, and ER to 60 degrees. VASO x 15 mins    LT shldr seated.                                PATIENT EDUCATION: Education details: healing, prognosis, and precautions Person educated: Patient Education method: Explanation Education comprehension: verbalized understanding  HOME EXERCISE PROGRAM: [id number]  ASSESSMENT:  CLINICAL IMPRESSION:  Pt arrives for today's treatment session 2/10 left shoulder pain.  Pt able to progress to red theraband with all theraband exercises today.  Pt given red tband to take home for HEP.  Normal responses to estim noted upon removal.  Pt denied any pain at completion of today's treatment session.    OBJECTIVE IMPAIRMENTS: decreased activity tolerance, decreased ROM, decreased strength, hypomobility, impaired flexibility, impaired tone, impaired UE functional use, and pain.   ACTIVITY LIMITATIONS: carrying, lifting, sleeping, bathing, dressing, self feeding, reach over head, and hygiene/grooming  PARTICIPATION LIMITATIONS: meal prep, cleaning,  laundry, shopping, community activity, and occupation  PERSONAL FACTORS: Profession, Time since onset of injury/illness/exacerbation, and 3+ comorbidities: Hypertension, diabetes type 2, chronic low back pain, history of prostate cancer, allergies, arthritis, and asthma  are also affecting patient's functional outcome.   REHAB POTENTIAL: Good  CLINICAL DECISION MAKING: Evolving/moderate complexity  EVALUATION COMPLEXITY: Moderate   GOALS: Goals reviewed with patient? Yes  SHORT TERM GOALS: Target date: 09/21/23  Patient will be independent with his initial HEP. Baseline: Goal status: MET  2.  Patient will be able to achieve least 125 degrees of passive left shoulder flexion for improved shoulder mobility. Baseline:  Goal status: MET  LONG TERM GOALS: Target date: 10/11/22  Patient will be independent with his advanced HEP. Baseline:  Goal status: IN PROGRESS  2.  Patient will be able to demonstrate at least 125 degrees of active left shoulder flexion for improved function with overhead activities. Baseline:  Goal status: IN PROGRESS  3.  Patient will be able to demonstrate at least 125 degrees of left shoulder abduction for improved function with  overhead activities. Baseline:  Goal status: IN PROGRESS  4.  Patient will be able to carry at least 8 pounds with his left upper extremity for improved function with household activities. Baseline:  Goal status: IN PROGRESS  5.  Patient will be able to return to his critical job demands without being limited by his left shoulder. Baseline:  Goal status: IN PROGRESS  PLAN:  PT FREQUENCY: 2x/week  PT DURATION: 6 weeks  PLANNED INTERVENTIONS: 97164- PT Re-evaluation, 97110-Therapeutic exercises, 97530- Therapeutic activity, 97112- Neuromuscular re-education, 97535- Self Care, 02859- Manual therapy, 97014- Electrical stimulation (unattended), 97016- Vasopneumatic device, Patient/Family education, Taping, Joint mobilization,  Cryotherapy, and Moist heat       no vaso  PLAN FOR NEXT SESSION: Pulleys, isometrics, active assisted range of motion, manual therapy, and modalities as needed.  Light strengthening as per MD   Delon DELENA Gosling, PTA 10/06/2023, 12:34 PM

## 2023-10-10 ENCOUNTER — Ambulatory Visit: Payer: Managed Care, Other (non HMO)

## 2023-10-10 DIAGNOSIS — M25512 Pain in left shoulder: Secondary | ICD-10-CM | POA: Diagnosis not present

## 2023-10-10 DIAGNOSIS — M6281 Muscle weakness (generalized): Secondary | ICD-10-CM

## 2023-10-10 DIAGNOSIS — M25612 Stiffness of left shoulder, not elsewhere classified: Secondary | ICD-10-CM

## 2023-10-10 NOTE — Therapy (Signed)
 OUTPATIENT PHYSICAL THERAPY SHOULDER TREATMENT   Patient Name: Robert Mcintyre MRN: [id number] DOB:July 04, 1966, 58 y.o., male Today's Date: 10/10/2023  END OF SESSION:  PT End of Session - 10/10/23 1014     Visit Number 10    Number of Visits 12    Date for PT Re-Evaluation 11/24/23    PT Start Time 1015    PT Stop Time 1058    PT Time Calculation (min) 43 min    Activity Tolerance Patient tolerated treatment well    Behavior During Therapy WFL for tasks assessed/performed                Past Medical History:  Diagnosis Date   Arthritis    back (had surgery 98), knees   Asthma    uses albuterol  during cold weather   Diabetes mellitus without complication (HCC)    Type 2   Hypertension    Morbid obesity (HCC)    Periumbilical hernia    pt unaware   Pneumonia    Prostate cancer (HCC)    prostate cancer around 2018, had surgery   Pure hypercholesterolemia 04/24/2020   Spondylolysis, lumbar region    Past Surgical History:  Procedure Laterality Date   COLONOSCOPY N/A 09/16/2016   Procedure: COLONOSCOPY;  Surgeon: Margo LITTIE Haddock, MD;  Location: [facility];  Service: Endoscopy;  Laterality: N/A;  1:00 PM   LYMPHADENECTOMY Bilateral 04/09/2018   Procedure: LYMPHADENECTOMY;  Surgeon: Renda Glance, MD;  Location: [facility];  Service: Urology;  Laterality: Bilateral;   POLYPECTOMY  09/16/2016   Procedure: POLYPECTOMY;  Surgeon: Margo LITTIE Haddock, MD;  Location: [facility];  Service: Endoscopy;;  ascending colon polyp, hepatic flexure polyp, descending colon polyp,    ROBOT ASSISTED LAPAROSCOPIC RADICAL PROSTATECTOMY N/A 04/09/2018   Procedure: XI ROBOTIC ASSISTED LAPAROSCOPIC RADICAL PROSTATECTOMY LEVEL 3;  Surgeon: Renda Glance, MD;  Location: [facility];  Service: Urology;  Laterality: N/A;  ONLY NEEDS 210 MIN FOR ALL PROCEDURES   SHOULDER ARTHROSCOPY WITH SUBACROMIAL DECOMPRESSION, ROTATOR CUFF REPAIR AND BICEP TENDON REPAIR Left 07/20/2023   Procedure: LEFT SHOULDER  ARTHROSCOPY, DEBRIDEMENT, BICEPS TENODESIS, MINI OPEN ROTATOR CUFF TEAR REPAIR;  Surgeon: Addie Cordella Hamilton, MD;  Location: [facility];  Service: Orthopedics;  Laterality: Left;   SPINE SURGERY  05/1997   back fusion   Patient Active Problem List   Diagnosis Date Noted   Synovitis of left shoulder 07/25/2023   Biceps tendonitis on left 07/25/2023   Complete tear of left rotator cuff 07/25/2023   Chronic left shoulder pain 02/07/2023   Musculoskeletal pain of upper extremity, left 10/12/2022   Vitamin D  deficiency 06/29/2022   Seasonal allergies 06/29/2022   Encounter for routine adult physical exam with abnormal findings 04/22/2022   Fatigue 04/22/2022   Need for varicella vaccine 11/04/2021   Need for immunization against influenza 11/04/2021   Type 2 diabetes mellitus with hyperglycemia, without long-term current use of insulin  (HCC) 11/04/2021   Chronic left-sided low back pain with left-sided sciatica 09/06/2021   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) 06/07/2021   Mass of skin of back 11/13/2020   Hyperlipidemia 10/16/2020   Insomnia, unspecified 10/16/2020   Pure hypercholesterolemia 04/24/2020   Poorly controlled diabetes mellitus (HCC) 01/25/2019   Prostate cancer (HCC) 04/09/2018   Essential hypertension 03/20/2017   Tubular adenoma of colon 09/20/2016   Encounter to establish care    Morbid obesity (HCC) 07/18/2016   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   REFERRING  PROVIDER: Addie Cordella Hamilton, MD   REFERRING DIAG: Traumatic incomplete tear of left rotator cuff, initial encounter   THERAPY DIAG:  Acute pain of left shoulder  Stiffness of left shoulder, not elsewhere classified  Muscle weakness (generalized)  Rationale for Evaluation and Treatment: Rehabilitation  ONSET DATE: 07/20/23  SUBJECTIVE:                                                                                                                                                                                       SUBJECTIVE STATEMENT: Patient reports that his shoulder is stiff this morning as he has not done anything with it yet today. He feels like he is about 50% back to his prior level of function.  Hand dominance: Right  PERTINENT HISTORY: Hypertension, diabetes type 2, chronic low back pain, history of prostate cancer, allergies, arthritis, and asthma  PAIN:  Are you having pain? Yes: NPRS scale: 1-2/10 Pain location: left shoulder with intermittent neck pain Pain description: constant sore and aching Aggravating factors: moving his arm the wrong way, getting dressed Relieving factors: rest  PRECAUTIONS: Shoulder  RED FLAGS: None   WEIGHT BEARING RESTRICTIONS: No  FALLS:  Has patient fallen in last 6 months? 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  PATIENT SURVEYS:  FOTO 60.38 on  10/10/23  COGNITION: Overall cognitive status: Within functional limits for tasks assessed     SENSATION: Patient reports no numbness or tingling.   POSTURE: Forward head  UPPER EXTREMITY ROM:   Active ROM Right eval Left Eval (PROM)  Left 10/10/23  Shoulder flexion 156 109 138  Shoulder extension     Shoulder abduction 149 90 117  Shoulder adduction     Shoulder internal rotation To T 8  To L1  Shoulder external rotation To T3  To T1  Elbow flexion     Elbow extension     Wrist flexion     Wrist extension     Wrist ulnar deviation     Wrist radial deviation     Wrist pronation     Wrist supination     (Blank rows = not tested)  UPPER EXTREMITY MMT: not tested due to surgical  condition  SHOULDER SPECIAL TESTS: Not tested due to surgical condition  PALPATION:  TTP: left upper trapezius, supraspinatus,  scapular stabilizers, deltoid, pectoralis major, and subscapularis   TODAY'S TREATMENT:                                                                                                                                         DATE:                                    10/10/23 EXERCISE LOG  Exercise Repetitions and Resistance Comments  Pulleys  6 minutes  Flexion  Wall ladder  4.5 minutes  Max #31  L shoulder IR  Green t-band x 2 minutes   Resisted row  Blue t-band x 10 reps    Resisted pull down  Blue t-band x 2 x 15 reps    Overhead isometric ball press  3 x 10 reps w/ 3 second hold    Bicep curl  8# x 10 reps each Neutral and supinated wrist   Blank cell = exercise not performed today                           10/04/23  EXERCISE LOG    LT shldr  Exercise Repetitions and Resistance Comments  Pulleys 6 minutes      flexion/ABD   Wall ladder  4 minutes  Max #18  UE ranger standing 5 minutes  Flexion and CW/CCW  Ext Red tband 3 x 10   Row Red tband 3 x 10   Supine press/flexion AROM    ER red Both UE same time x20 HEP   Blank cell = exercise not performed today   Modalities  Date:  Unattended Estim: Shoulder, IFC 80-105 Hz, 15 mins, Pain and Tone                                    09/12/23 EXERCISE LOG     LT shldr  Exercise Repetitions and Resistance Comments  Pulleys 6 minutes  Scaption  Ball roll out   AA shoulder flexion  Seated UE ranger X 5 mins   Table slide  Flexion, circles CW, CCW  3x10        Blank cell = exercise not performed today  Discussed and reviewed HEP Manual PROM for LT shldr flexion to 155 degrees, ABD to 135 degrees, and ER to 60 degrees. VASO x 15 mins    LT shldr seated.                                PATIENT EDUCATION: Education details: healing, prognosis, and precautions Person educated: Patient Education method:  Explanation Education comprehension: verbalized understanding  HOME EXERCISE PROGRAM: [id number]  ASSESSMENT:  CLINICAL IMPRESSION:  Patient is making good progress with skilled physical therapy as evidenced by his subjective reports, objective measures, functional mobility, and progress toward his goals. He was able to demonstrate a significant improvement in his left shoulder active range of motion. However, he was unable to meet his long term left shoulder abduction active range of motion goal. He was progressed with multiple new and familiar interventions for improved rotator cuff strength and mobility with moderate difficulty and fatigue. He experienced no significant increase in pain or discomfort with any of today's interventions. He reported feeling better upon the conclusion of treatment. He continues to require skilled physical therapy to address his remaining impairments to return to his prior level of function.   OBJECTIVE IMPAIRMENTS: decreased activity tolerance, decreased ROM, decreased strength, hypomobility, impaired flexibility, impaired tone, impaired UE functional use, and pain.   ACTIVITY LIMITATIONS: carrying, lifting, sleeping, bathing, dressing, self feeding, reach over head, and hygiene/grooming  PARTICIPATION LIMITATIONS: meal prep, cleaning, laundry, shopping, community activity, and occupation  PERSONAL FACTORS: Profession, Time since onset of injury/illness/exacerbation, and 3+ comorbidities: Hypertension, diabetes type 2, chronic low back pain, history of prostate cancer, allergies, arthritis, and asthma  are also affecting patient's functional outcome.   REHAB POTENTIAL: Good  CLINICAL DECISION MAKING: Evolving/moderate complexity  EVALUATION COMPLEXITY: Moderate   GOALS: Goals reviewed with patient? Yes  SHORT TERM GOALS: Target date: 09/21/23  Patient will be independent with his initial HEP. Baseline: Goal status: MET  2.  Patient will be able to  achieve least 125 degrees of passive left shoulder flexion for improved shoulder mobility. Baseline:  Goal status: MET  LONG TERM GOALS: Target date: 10/11/22  Patient will be independent with his advanced HEP. Baseline:  Goal status: IN PROGRESS  2.  Patient will be able to demonstrate at least 125 degrees of active left shoulder flexion for improved function with overhead activities. Baseline:  Goal status: MET  3.  Patient will be able to demonstrate at least 125 degrees of left shoulder abduction for improved function with overhead activities. Baseline:  Goal status: IN PROGRESS  4.  Patient will be able to carry at least 8 pounds with his left upper extremity for improved function with household activities. Baseline:  Goal status: MET  5.  Patient will be able to return to his critical job demands without being limited by his left shoulder. Baseline:  Goal status: IN PROGRESS  PLAN:  PT FREQUENCY: 2x/week  PT DURATION: 6 weeks  PLANNED INTERVENTIONS: 97164- PT Re-evaluation, 97110-Therapeutic exercises, 97530- Therapeutic activity, 97112- Neuromuscular re-education, 97535- Self Care, 02859- Manual therapy, 97014- Electrical stimulation (unattended), 97016- Vasopneumatic device, Patient/Family education, Taping, Joint mobilization, Cryotherapy, and Moist heat       no vaso  PLAN FOR NEXT SESSION: Pulleys, isometrics, active assisted range of motion, manual therapy, and modalities as needed.  Light strengthening as per MD   Lacinda JAYSON Fass, PT 10/10/2023, 11:45 AM

## 2023-10-12 ENCOUNTER — Ambulatory Visit: Payer: Managed Care, Other (non HMO)

## 2023-10-12 DIAGNOSIS — M25512 Pain in left shoulder: Secondary | ICD-10-CM | POA: Diagnosis not present

## 2023-10-12 DIAGNOSIS — M25612 Stiffness of left shoulder, not elsewhere classified: Secondary | ICD-10-CM

## 2023-10-12 DIAGNOSIS — M6281 Muscle weakness (generalized): Secondary | ICD-10-CM

## 2023-10-12 NOTE — Therapy (Signed)
OUTPATIENT PHYSICAL THERAPY SHOULDER TREATMENT   Patient Name: Robert Mcintyre MRN: [id number] DOB:July 30, 1966, 58 y.o., male Today's Date: 10/12/2023  END OF SESSION:  PT End of Session - 10/12/23 0934     Visit Number 11    Number of Visits 12    Date for PT Re-Evaluation 11/24/23    PT Start Time 0930    PT Stop Time 1015    PT Time Calculation (min) 45 min    Activity Tolerance Patient tolerated treatment well    Behavior During Therapy [facility] for tasks assessed/performed                 Past Medical History:  Diagnosis Date   Arthritis    back (had surgery 98), knees   Asthma    uses albuterol during cold weather   Diabetes mellitus without complication (HCC)    Type 2   Hypertension    Morbid obesity (HCC)    Periumbilical hernia    pt unaware   Pneumonia    Prostate cancer (HCC)    prostate cancer around 2018, had surgery   Pure hypercholesterolemia 04/24/2020   Spondylolysis, lumbar region    Past Surgical History:  Procedure Laterality Date   COLONOSCOPY N/A 09/16/2016   Procedure: COLONOSCOPY;  Surgeon: West Bali, MD;  [facility];  Service: Endoscopy;  Laterality: N/A;  1:00 PM   LYMPHADENECTOMY Bilateral 04/09/2018   Procedure: LYMPHADENECTOMY;  Surgeon: Heloise Purpura, MD;  Location: [facility];  Service: Urology;  Laterality: Bilateral;   POLYPECTOMY  09/16/2016   Procedure: POLYPECTOMY;  Surgeon: West Bali, MD;  [facility]: [facility];  Service: Endoscopy;;  ascending colon polyp, hepatic flexure polyp, descending colon polyp,    ROBOT ASSISTED LAPAROSCOPIC RADICAL PROSTATECTOMY N/A 04/09/2018   Procedure: XI ROBOTIC ASSISTED LAPAROSCOPIC RADICAL PROSTATECTOMY LEVEL 3;  Surgeon: Heloise Purpura, MD;  Location: [facility];  Service: Urology;  Laterality: N/A;  ONLY NEEDS 210 MIN FOR ALL PROCEDURES   SHOULDER ARTHROSCOPY WITH SUBACROMIAL DECOMPRESSION, ROTATOR CUFF REPAIR AND BICEP TENDON REPAIR Left 07/20/2023   Procedure: LEFT SHOULDER  ARTHROSCOPY, DEBRIDEMENT, BICEPS TENODESIS, MINI OPEN ROTATOR CUFF TEAR REPAIR;  Surgeon: Cammy Copa, MD;  Location: [facility];  Service: Orthopedics;  Laterality: Left;   SPINE SURGERY  05/1997   back fusion   Patient Active Problem List   Diagnosis Date Noted   Synovitis of left shoulder 07/25/2023   Biceps tendonitis on left 07/25/2023   Complete tear of left rotator cuff 07/25/2023   Chronic left shoulder pain 02/07/2023   Musculoskeletal pain of upper extremity, left 10/12/2022   Vitamin D deficiency 06/29/2022   Seasonal allergies 06/29/2022   Encounter for routine adult physical exam with abnormal findings 04/22/2022   Fatigue 04/22/2022   Need for varicella vaccine 11/04/2021   Need for immunization against influenza 11/04/2021   Type 2 diabetes mellitus with hyperglycemia, without long-term current use of insulin (HCC) 11/04/2021   Chronic left-sided low back pain with left-sided sciatica 09/06/2021   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) 06/07/2021   Mass of skin of back 11/13/2020   Hyperlipidemia 10/16/2020   Insomnia, unspecified 10/16/2020   Pure hypercholesterolemia 04/24/2020   Poorly controlled diabetes mellitus (HCC) 01/25/2019   Prostate cancer (HCC) 04/09/2018   Essential hypertension 03/20/2017   Tubular adenoma of colon 09/20/2016   Encounter to establish care    Morbid obesity (HCC) 07/18/2016   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  REFERRING PROVIDER: Cammy Copa, MD   REFERRING DIAG: Traumatic incomplete tear of left rotator cuff, initial encounter   THERAPY DIAG:  Acute pain of left shoulder  Stiffness of left shoulder, not elsewhere classified  Muscle weakness (generalized)  Rationale for Evaluation and Treatment: Rehabilitation  ONSET DATE: 07/20/23  SUBJECTIVE:                                                                                                                                                                                       SUBJECTIVE STATEMENT: Patient reports that he is not hurting any today, but his shoulder is stiff. Hand dominance: Right  PERTINENT HISTORY: Hypertension, diabetes type 2, chronic low back pain, history of prostate cancer, allergies, arthritis, and asthma  PAIN:  Are you having pain? Yes: NPRS scale: 0/10 Pain location: left shoulder with intermittent neck pain Pain description: constant sore and aching Aggravating factors: moving his arm the wrong way, getting dressed Relieving factors: rest  PRECAUTIONS: Shoulder  RED FLAGS: None   WEIGHT BEARING RESTRICTIONS: No  FALLS:  Has patient fallen in last 6 months? 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  PATIENT SURVEYS:  FOTO 60.38 on  10/10/23  COGNITION: Overall cognitive status: Within functional limits for tasks assessed     SENSATION: Patient reports no numbness or tingling.   POSTURE: Forward head  UPPER EXTREMITY ROM:   Active ROM Right eval Left Eval (PROM)  Left 10/10/23  Shoulder flexion 156 109 138  Shoulder extension     Shoulder abduction 149 90 117  Shoulder adduction     Shoulder internal rotation To T 8  To L1  Shoulder external rotation To T3  To T1  Elbow flexion     Elbow extension     Wrist flexion     Wrist extension     Wrist ulnar deviation     Wrist radial deviation     Wrist pronation     Wrist supination     (Blank rows = not tested)  UPPER EXTREMITY MMT: not tested due to surgical condition  SHOULDER SPECIAL TESTS: Not tested due to surgical condition  PALPATION:  TTP: left upper  trapezius, supraspinatus, scapular stabilizers, deltoid, pectoralis major, and subscapularis   TODAY'S TREATMENT:  DATE:                                    10/12/23 EXERCISE LOG  Exercise Repetitions and Resistance Comments  Pulleys 4 minutes   UBE  8 minutes @ 90 RPM    L shoulder IR  Green t-band x 2 minutes    L shoulder ER  Red t-band x 20 reps   Resisted pull down  Blue t-band x 4 minutes    Functional IR stretch  3 x 30 seconds        Blank cell = exercise not performed today  Modalities: no adverse reaction to today's modalities  Date:  Cold Pack: Shoulder, 15 mins, soreness                                   10/10/23 EXERCISE LOG  Exercise Repetitions and Resistance Comments  Pulleys  6 minutes  Flexion  Wall ladder  4.5 minutes  Max #31  L shoulder IR  Green t-band x 2 minutes   Resisted row  Blue t-band x 10 reps    Resisted pull down  Blue t-band x 2 x 15 reps    Overhead isometric ball press  3 x 10 reps w/ 3 second hold    Bicep curl  8# x 10 reps each Neutral and supinated wrist   Blank cell = exercise not performed today                           10/04/23  EXERCISE LOG    LT shldr  Exercise Repetitions and Resistance Comments  Pulleys 6 minutes      flexion/ABD   Wall ladder  4 minutes  Max #18  UE ranger standing 5 minutes  Flexion and CW/CCW  Ext Red tband 3 x 10   Row Red tband 3 x 10   Supine press/flexion AROM    ER red Both UE same time x20 HEP   Blank cell = exercise not performed today   Modalities  Date:  Unattended Estim: Shoulder, IFC 80-105 Hz, 15 mins, Pain and Tone  PATIENT EDUCATION: Education details: healing, prognosis, and precautions Person educated: Patient Education method: Explanation Education comprehension: verbalized understanding  HOME EXERCISE PROGRAM: [id number]  ASSESSMENT:  CLINICAL  IMPRESSION:  Patient was progressed with the upper body ergometer and resisted external rotation with moderate difficulty and fatigue. He required minimal cueing with resisted external rotation for proper body mechanics to facilitate improved rotator cuff engagement. He experienced a moderate increase in fatigue and soreness, but this was able to be relieved with a cold pack to his shoulder. He reported feeling good upon the conclusion of treatment. He continues to require skilled physical therapy to address his remaining impairments to return to his prior level of function.   OBJECTIVE IMPAIRMENTS: decreased activity tolerance, decreased ROM, decreased strength, hypomobility, impaired flexibility, impaired tone, impaired UE functional use, and pain.   ACTIVITY LIMITATIONS: carrying, lifting, sleeping, bathing, dressing, self feeding, reach over head, and hygiene/grooming  PARTICIPATION LIMITATIONS: meal prep, cleaning, laundry, shopping, community activity, and occupation  PERSONAL FACTORS: Profession, Time since onset of injury/illness/exacerbation, and 3+ comorbidities: Hypertension, diabetes type 2, chronic low back pain, history of prostate cancer, allergies, arthritis, and asthma  are also affecting patient's functional outcome.  REHAB POTENTIAL: Good  CLINICAL DECISION MAKING: Evolving/moderate complexity  EVALUATION COMPLEXITY: Moderate   GOALS: Goals reviewed with patient? Yes  SHORT TERM GOALS: Target date: 09/21/23  Patient will be independent with his initial HEP. Baseline: Goal status: MET  2.  Patient will be able to achieve least 125 degrees of passive left shoulder flexion for improved shoulder mobility. Baseline:  Goal status: MET  LONG TERM GOALS: Target date: 10/11/22  Patient will be independent with his advanced HEP. Baseline:  Goal status: IN PROGRESS  2.  Patient will be able to demonstrate at least 125 degrees of active left shoulder flexion for improved  function with overhead activities. Baseline:  Goal status: MET  3.  Patient will be able to demonstrate at least 125 degrees of left shoulder abduction for improved function with overhead activities. Baseline:  Goal status: IN PROGRESS  4.  Patient will be able to carry at least 8 pounds with his left upper extremity for improved function with household activities. Baseline:  Goal status: MET  5.  Patient will be able to return to his critical job demands without being limited by his left shoulder. Baseline:  Goal status: IN PROGRESS  PLAN:  PT FREQUENCY: 2x/week  PT DURATION: 6 weeks  PLANNED INTERVENTIONS: 97164- PT Re-evaluation, 97110-Therapeutic exercises, 97530- Therapeutic activity, 97112- Neuromuscular re-education, 97535- Self Care, 81191- Manual therapy, 97014- Electrical stimulation (unattended), 97016- Vasopneumatic device, Patient/Family education, Taping, Joint mobilization, Cryotherapy, and Moist heat       no vaso  PLAN FOR NEXT SESSION: Pulleys, isometrics, active assisted range of motion, manual therapy, and modalities as needed.  Light strengthening as per MD   Granville Lewis, PT 10/12/2023, 10:51 AM

## 2023-10-17 ENCOUNTER — Ambulatory Visit: Payer: Managed Care, Other (non HMO)

## 2023-10-17 DIAGNOSIS — M25512 Pain in left shoulder: Secondary | ICD-10-CM

## 2023-10-17 DIAGNOSIS — M6281 Muscle weakness (generalized): Secondary | ICD-10-CM

## 2023-10-17 DIAGNOSIS — M25612 Stiffness of left shoulder, not elsewhere classified: Secondary | ICD-10-CM

## 2023-10-17 NOTE — Therapy (Signed)
OUTPATIENT PHYSICAL THERAPY SHOULDER TREATMENT   Patient Name: Robert Mcintyre MRN: [id number] DOB:06-04-1966, 58 y.o., male Today's Date: 10/17/2023  END OF SESSION:  PT End of Session - 10/17/23 1104     Visit Number 12    Number of Visits 12    Date for PT Re-Evaluation 11/24/23    PT Start Time 1100    PT Stop Time 1151    PT Time Calculation (min) 51 min    Activity Tolerance Patient tolerated treatment well    Behavior During Therapy [facility] for tasks assessed/performed                 Past Medical History:  Diagnosis Date   Arthritis    back (had surgery 98), knees   Asthma    uses albuterol during cold weather   Diabetes mellitus without complication (HCC)    Type 2   Hypertension    Morbid obesity (HCC)    Periumbilical hernia    pt unaware   Pneumonia    Prostate cancer (HCC)    prostate cancer around 2018, had surgery   Pure hypercholesterolemia 04/24/2020   Spondylolysis, lumbar region    Past Surgical History:  Procedure Laterality Date   COLONOSCOPY N/A 09/16/2016   Procedure: COLONOSCOPY;  Surgeon: West Bali, MD;  [facility];  Service: Endoscopy;  Laterality: N/A;  1:00 PM   LYMPHADENECTOMY Bilateral 04/09/2018   Procedure: LYMPHADENECTOMY;  Surgeon: Heloise Purpura, MD;  Location: [facility];  Service: Urology;  Laterality: Bilateral;   POLYPECTOMY  09/16/2016   Procedure: POLYPECTOMY;  Surgeon: West Bali, MD;  [facility];  Service: Endoscopy;;  ascending colon polyp, hepatic flexure polyp, descending colon polyp,    ROBOT ASSISTED LAPAROSCOPIC RADICAL PROSTATECTOMY N/A 04/09/2018   Procedure: XI ROBOTIC ASSISTED LAPAROSCOPIC RADICAL PROSTATECTOMY LEVEL 3;  Surgeon: Heloise Purpura, MD;  Location: [facility];  Service: Urology;  Laterality: N/A;  ONLY NEEDS 210 MIN FOR ALL PROCEDURES   SHOULDER ARTHROSCOPY WITH SUBACROMIAL DECOMPRESSION, ROTATOR CUFF REPAIR AND BICEP TENDON REPAIR Left 07/20/2023   Procedure: LEFT SHOULDER  ARTHROSCOPY, DEBRIDEMENT, BICEPS TENODESIS, MINI OPEN ROTATOR CUFF TEAR REPAIR;  Surgeon: Cammy Copa, MD;  Location: [facility];  Service: Orthopedics;  Laterality: Left;   SPINE SURGERY  05/1997   back fusion   Patient Active Problem List   Diagnosis Date Noted   Synovitis of left shoulder 07/25/2023   Biceps tendonitis on left 07/25/2023   Complete tear of left rotator cuff 07/25/2023   Chronic left shoulder pain 02/07/2023   Musculoskeletal pain of upper extremity, left 10/12/2022   Vitamin D deficiency 06/29/2022   Seasonal allergies 06/29/2022   Encounter for routine adult physical exam with abnormal findings 04/22/2022   Fatigue 04/22/2022   Need for varicella vaccine 11/04/2021   Need for immunization against influenza 11/04/2021   Type 2 diabetes mellitus with hyperglycemia, without long-term current use of insulin (HCC) 11/04/2021   Chronic left-sided low back pain with left-sided sciatica 09/06/2021   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) 06/07/2021   Mass of skin of back 11/13/2020   Hyperlipidemia 10/16/2020   Insomnia, unspecified 10/16/2020   Pure hypercholesterolemia 04/24/2020   Poorly controlled diabetes mellitus (HCC) 01/25/2019   Prostate cancer (HCC) 04/09/2018   Essential hypertension 03/20/2017   Tubular adenoma of colon 09/20/2016   Encounter to establish care    Morbid obesity (HCC) 07/18/2016   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  REFERRING PROVIDER: Cammy Copa, MD   REFERRING DIAG: Traumatic incomplete tear of left rotator cuff, initial encounter   THERAPY DIAG:  Acute pain of left shoulder  Stiffness of left shoulder, not elsewhere classified  Muscle weakness (generalized)  Rationale for Evaluation and Treatment: Rehabilitation  ONSET DATE: 07/20/23  SUBJECTIVE:                                                                                                                                                                                       SUBJECTIVE STATEMENT: Patient reports 1-2/10 left shoulder pain today. Hand dominance: Right  PERTINENT HISTORY: Hypertension, diabetes type 2, chronic low back pain, history of prostate cancer, allergies, arthritis, and asthma  PAIN:  Are you having pain? Yes: NPRS scale: 1-2/10 Pain location: left shoulder with intermittent neck pain Pain description: constant sore and aching Aggravating factors: moving his arm the wrong way, getting dressed Relieving factors: rest  PRECAUTIONS: Shoulder  RED FLAGS: None   WEIGHT BEARING RESTRICTIONS: No  FALLS:  Has patient fallen in last 6 months? 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  PATIENT SURVEYS:  FOTO 60.38 on  10/10/23  COGNITION: Overall cognitive status: Within functional limits for tasks assessed     SENSATION: Patient reports no numbness or tingling.   POSTURE: Forward head  UPPER EXTREMITY ROM:   Active ROM Right eval Left Eval (PROM)  Left 10/10/23  Shoulder flexion 156 109 138  Shoulder extension     Shoulder abduction 149 90 117  Shoulder adduction     Shoulder internal rotation To T 8  To L1  Shoulder external rotation To T3  To T1  Elbow flexion     Elbow extension     Wrist flexion     Wrist extension     Wrist ulnar deviation     Wrist radial deviation     Wrist pronation     Wrist supination     (Blank rows = not tested)  UPPER EXTREMITY MMT: not tested due to surgical condition  SHOULDER SPECIAL TESTS: Not tested due to surgical condition  PALPATION:  TTP: left upper trapezius, supraspinatus,  scapular stabilizers, deltoid, pectoralis major, and subscapularis   TODAY'S TREATMENT:  DATE:                                    10/17/23 EXERCISE LOG  Exercise Repetitions and Resistance Comments  Pulleys 5 minutes   UBE  10 minutes @ 90 RPM    Ball on the Wall 3 mins   L shoulder IR  Green t-band x 2.5 minutes    L shoulder ER  Red t-band x 25 reps   Resisted pull down  Blue t-band x 4 minutes    Functional IR stretch  3 x 30 seconds        Blank cell = exercise not performed today  Modalities: no adverse reaction to today's modalities  Date:  Unattended Estim: Shoulder, IFC 80-150 Hz, 15 mins, Pain Cold Pack: Shoulder, 15 mins, Pain                                   10/10/23 EXERCISE LOG  Exercise Repetitions and Resistance Comments  Pulleys  6 minutes  Flexion  Wall ladder  4.5 minutes  Max #31  L shoulder IR  Green t-band x 2 minutes   Resisted row  Blue t-band x 10 reps    Resisted pull down  Blue t-band x 2 x 15 reps    Overhead isometric ball press  3 x 10 reps w/ 3 second hold    Bicep curl  8# x 10 reps each Neutral and supinated wrist   Blank cell = exercise not performed today                           10/04/23  EXERCISE LOG    LT shldr  Exercise Repetitions and Resistance Comments  Pulleys 6 minutes      flexion/ABD   Wall ladder  4 minutes  Max #18  UE ranger standing 5 minutes  Flexion and CW/CCW  Ext Red tband 3 x 10   Row Red tband 3 x 10   Supine press/flexion AROM    ER red Both UE same time x20 HEP   Blank cell = exercise not performed today   Modalities  Date:  Unattended Estim: Shoulder, IFC 80-105 Hz, 15 mins, Pain and Tone  PATIENT EDUCATION: Education details: healing, prognosis, and precautions Person educated: Patient Education method: Explanation Education comprehension: verbalized understanding  HOME  EXERCISE PROGRAM: [id number]  ASSESSMENT:  CLINICAL IMPRESSION:  Pt arrives for today's treatment session reporting 1-2/10 left shoulder pain.  Pt challenged by ball on the wall exercise today.  Pt able to tolerate increased time or reps with all other exercises performed today.  Normal responses to estim and cold pack noted upon removal.  Pt reported decreased pain at completion of today's treatment session.    OBJECTIVE IMPAIRMENTS: decreased activity tolerance, decreased ROM, decreased strength, hypomobility, impaired flexibility, impaired tone, impaired UE functional use, and pain.   ACTIVITY LIMITATIONS: carrying, lifting, sleeping, bathing, dressing, self feeding, reach over head, and hygiene/grooming  PARTICIPATION LIMITATIONS: meal prep, cleaning, laundry, shopping, community activity, and occupation  PERSONAL FACTORS: Profession, Time since onset of injury/illness/exacerbation, and 3+ comorbidities: Hypertension, diabetes type 2, chronic low back pain, history of prostate cancer, allergies, arthritis, and asthma  are also affecting patient's functional outcome.   REHAB POTENTIAL: Good  CLINICAL DECISION MAKING: Evolving/moderate complexity  EVALUATION COMPLEXITY: Moderate   GOALS: Goals reviewed with patient? Yes  SHORT TERM GOALS: Target date: 09/21/23  Patient will be independent with his initial HEP. Baseline: Goal status: MET  2.  Patient will be able to achieve least 125 degrees of passive left shoulder flexion for improved shoulder mobility. Baseline:  Goal status: MET  LONG TERM GOALS: Target date: 10/11/22  Patient will be independent with his advanced HEP. Baseline:  Goal status: IN PROGRESS  2.  Patient will be able to demonstrate at least 125 degrees of active left shoulder flexion for improved function with overhead activities. Baseline:  Goal status: MET  3.  Patient will be able to demonstrate at least 125 degrees of left shoulder abduction for  improved function with overhead activities. Baseline:  Goal status: IN PROGRESS  4.  Patient will be able to carry at least 8 pounds with his left upper extremity for improved function with household activities. Baseline:  Goal status: MET  5.  Patient will be able to return to his critical job demands without being limited by his left shoulder. Baseline:  Goal status: IN PROGRESS  PLAN:  PT FREQUENCY: 2x/week  PT DURATION: 6 weeks  PLANNED INTERVENTIONS: 97164- PT Re-evaluation, 97110-Therapeutic exercises, 97530- Therapeutic activity, 97112- Neuromuscular re-education, 97535- Self Care, 09811- Manual therapy, 97014- Electrical stimulation (unattended), 97016- Vasopneumatic device, Patient/Family education, Taping, Joint mobilization, Cryotherapy, and Moist heat       no vaso  PLAN FOR NEXT SESSION: Pulleys, isometrics, active assisted range of motion, manual therapy, and modalities as needed.  Light strengthening as per MD   Newman Pies, PTA 10/17/2023, 11:58 AM

## 2023-10-18 ENCOUNTER — Ambulatory Visit (INDEPENDENT_AMBULATORY_CARE_PROVIDER_SITE_OTHER): Payer: Managed Care, Other (non HMO) | Admitting: Orthopedic Surgery

## 2023-10-18 ENCOUNTER — Encounter: Payer: Self-pay | Admitting: Orthopedic Surgery

## 2023-10-18 DIAGNOSIS — S46012A Strain of muscle(s) and tendon(s) of the rotator cuff of left shoulder, initial encounter: Secondary | ICD-10-CM

## 2023-10-18 NOTE — Progress Notes (Signed)
Post-Op Visit Note   Patient: Robert Mcintyre           Date of Birth: 1965/11/10           MRN: [id number] Visit Date: 10/18/2023 PCP: Gilmore Laroche, FNP   Assessment & Plan:  Chief Complaint:  Chief Complaint  Patient presents with   Left Shoulder - Routine Post Op        LEFT SHOULDER SCOPE, deb, BT and MORCTR(surgery date 07-20-23)       Visit Diagnoses:  1. Traumatic incomplete tear of left rotator cuff, initial encounter     Plan: Robert Mcintyre is a 58 year old patient is now 3 months out left shoulder rotator cuff tear repair.  He has been doing well.  Doing strengthening and physical therapy.  On examination he has very good range of motion of 45/90/155.  Rotator cuff strength is very good to external rotation and internal rotation strength testing.  No grinding or coarseness with passive range of motion of the shoulder.  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  He will return to work on February 10 with no restrictions.  Follow-up with [place] as needed.  Follow-Up Instructions: No follow-ups on file.   Orders:  No orders of the defined types were placed in this encounter.  No orders of the defined types were placed in this encounter.   Imaging: No results found.  PMFS History: Patient Active Problem List   Diagnosis Date Noted   Synovitis of left shoulder 07/25/2023   Biceps tendonitis on left 07/25/2023   Complete tear of left rotator cuff 07/25/2023   Chronic left shoulder pain 02/07/2023   Musculoskeletal pain of upper extremity, left 10/12/2022   Vitamin D deficiency 06/29/2022   Seasonal allergies 06/29/2022   Encounter for routine adult physical exam with abnormal findings 04/22/2022   Fatigue 04/22/2022   Need for varicella vaccine 11/04/2021   Need for immunization against influenza 11/04/2021   Type 2 diabetes mellitus with hyperglycemia,  without long-term current use of insulin (HCC) 11/04/2021   Chronic left-sided low back pain with left-sided sciatica 09/06/2021   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) 06/07/2021   Mass of skin of back 11/13/2020   Hyperlipidemia 10/16/2020   Insomnia, unspecified 10/16/2020   Pure hypercholesterolemia 04/24/2020   Poorly controlled diabetes mellitus (HCC) 01/25/2019   Prostate cancer (HCC) 04/09/2018   Essential hypertension 03/20/2017   Tubular adenoma of colon 09/20/2016   Encounter to establish care    Morbid obesity (HCC) 07/18/2016   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   Past Medical History:  Diagnosis Date   Arthritis    back (had surgery 98), knees   Asthma    uses albuterol during cold weather   Diabetes mellitus without complication (HCC)    Type 2   Hypertension    Morbid obesity (HCC)    Periumbilical hernia    pt unaware   Pneumonia    Prostate cancer (HCC)    prostate cancer around 2018, had surgery   Pure hypercholesterolemia 04/24/2020   Spondylolysis, lumbar region     Family History  Problem Relation Age of Onset   Heart disease Mother    Stroke Mother    Hypertension Mother    Diabetes Mother    Asthma Father    Hypertension Brother  Past Surgical History:  Procedure Laterality Date   COLONOSCOPY N/A 09/16/2016   Procedure: COLONOSCOPY;  Surgeon: West Bali, MD;  Location: [facility];  Service: Endoscopy;  Laterality: N/A;  1:00 PM   LYMPHADENECTOMY Bilateral 04/09/2018   Procedure: LYMPHADENECTOMY;  Surgeon: Heloise Purpura, MD;  Location: [facility];  Service: Urology;  Laterality: Bilateral;   POLYPECTOMY  09/16/2016   Procedure: POLYPECTOMY;  Surgeon: West Bali, MD;  Location: [facility];  Service: Endoscopy;;  ascending colon polyp, hepatic flexure polyp, descending colon polyp,    ROBOT ASSISTED LAPAROSCOPIC RADICAL PROSTATECTOMY N/A 04/09/2018   Procedure: XI ROBOTIC ASSISTED  LAPAROSCOPIC RADICAL PROSTATECTOMY LEVEL 3;  Surgeon: Heloise Purpura, MD;  Location: [facility];  Service: Urology;  Laterality: N/A;  ONLY NEEDS 210 MIN FOR ALL PROCEDURES   SHOULDER ARTHROSCOPY WITH SUBACROMIAL DECOMPRESSION, ROTATOR CUFF REPAIR AND BICEP TENDON REPAIR Left 07/20/2023   Procedure: LEFT SHOULDER ARTHROSCOPY, DEBRIDEMENT, BICEPS TENODESIS, MINI OPEN ROTATOR CUFF TEAR REPAIR;  Surgeon: Cammy Copa, MD;  Location: [facility];  Service: Orthopedics;  Laterality: Left;   SPINE SURGERY  05/1997   back fusion   Social History   Occupational History   Occupation: truck driver  Tobacco Use   Smoking status: Former    Current packs/day: 0.00    Average packs/day: 0.3 packs/day for 3.0 years (0.8 ttl pk-yrs)    Types: Cigarettes    Start date: 14    Quit date: 1980    Years since quitting: 45.0   Smokeless tobacco: Never  Vaping Use   Vaping status: Never Used  Substance and Sexual Activity   Alcohol use: Not Currently    Comment: occasionally   Drug use: No   Sexual activity: Not Currently

## 2023-10-18 NOTE — Addendum Note (Signed)
Addended by: Candi Leash C on: 10/18/2023 04:00 PM   Modules accepted: Orders

## 2023-11-01 ENCOUNTER — Telehealth: Payer: Self-pay

## 2023-11-01 NOTE — Telephone Encounter (Signed)
 Copied from CRM [id number]. Topic: Clinical - Request for Lab/Test Order >> Nov 01, 2023 12:35 PM [place] wrote: Reason for CRM: Needs A1C labs for DOT  please call patient [phone]

## 2023-11-01 NOTE — Telephone Encounter (Signed)
 He should have returned the end of Sept for follow up and will need appt scheduled for labs to be ordered. If he happens to need the A1c before he can get in for an appt, we can send a message to [place] and see if she will place order once he has a visit scheduled

## 2023-11-02 ENCOUNTER — Ambulatory Visit (INDEPENDENT_AMBULATORY_CARE_PROVIDER_SITE_OTHER): Payer: Managed Care, Other (non HMO) | Admitting: Family Medicine

## 2023-11-02 ENCOUNTER — Encounter: Payer: Self-pay | Admitting: Family Medicine

## 2023-11-02 VITALS — BP 130/80 | HR 88 | Resp 16 | Ht 68.5 in | Wt 292.0 lb

## 2023-11-02 DIAGNOSIS — I10 Essential (primary) hypertension: Secondary | ICD-10-CM

## 2023-11-02 DIAGNOSIS — Z7984 Long term (current) use of oral hypoglycemic drugs: Secondary | ICD-10-CM

## 2023-11-02 DIAGNOSIS — R7301 Impaired fasting glucose: Secondary | ICD-10-CM

## 2023-11-02 DIAGNOSIS — B351 Tinea unguium: Secondary | ICD-10-CM | POA: Diagnosis not present

## 2023-11-02 DIAGNOSIS — E1165 Type 2 diabetes mellitus with hyperglycemia: Secondary | ICD-10-CM

## 2023-11-02 DIAGNOSIS — E038 Other specified hypothyroidism: Secondary | ICD-10-CM

## 2023-11-02 DIAGNOSIS — E559 Vitamin D deficiency, unspecified: Secondary | ICD-10-CM

## 2023-11-02 DIAGNOSIS — E7849 Other hyperlipidemia: Secondary | ICD-10-CM

## 2023-11-02 MED ORDER — EMPAGLIFLOZIN 10 MG PO TABS: 10.0000 mg | ORAL_TABLET | Freq: Every day | ORAL | Status: DC

## 2023-11-02 MED ORDER — AMLODIPINE-OLMESARTAN 10-20 MG PO TABS: 1.0000 | ORAL_TABLET | Freq: Every day | ORAL | 1 refills | Status: DC

## 2023-11-02 MED ORDER — TERBINAFINE HCL 1 % EX CREA
1.0000 | TOPICAL_CREAM | Freq: Two times a day (BID) | CUTANEOUS | 2 refills | Status: AC
Start: 2023-11-02 — End: ?

## 2023-11-02 NOTE — Patient Instructions (Addendum)
 I appreciate the opportunity to provide care to you today!    Follow up:  4 months  Labs: please stop by the lab today to get your blood drawn (CBC, CMP, TSH, Lipid profile, HgA1c, Vit D)  Lamisil  (Terbinafine ) Topical Application Instructions How to Apply: Wash and Dry the Area Clean the affected skin with mild soap and water . Pat the area dry completely before applying the medication. Apply a Thin Layer Squeeze a small amount of Lamisil  cream, gel, or spray onto your fingertips. Gently rub a thin layer over the affected area and about 1 inch beyond the edges to prevent spread. Wash Your Hands After applying the medication, wash your hands thoroughly to prevent spreading the infection to other areas. Use as Directed  Precautions: Do not apply to broken, irritated, or inflamed skin. Avoid contact with eyes, nose, or mouth. Keep the affected area clean and dry; change socks and underwear regularly. If symptoms do not improve within 2 weeks, consult your healthcare provider.    Please continue to a heart-healthy diet and increase your physical activities. Try to exercise for at least five days a week.    It was a pleasure to see you and I look forward to continuing to work together on your health and well-being. Please do not hesitate to call the office if you need care or have questions about your care.  In case of emergency, please visit the Emergency Department for urgent care, or contact our clinic at [phone] to schedule an appointment. We're here to help you!   Have a wonderful day and week. With Gratitude, Haliey Romberg MSN, FNP-BC

## 2023-11-02 NOTE — Assessment & Plan Note (Signed)
 Will initiate therapy with Lamisil , and topical application instructions have been provided.

## 2023-11-02 NOTE — Assessment & Plan Note (Signed)
 A diabetic foot exam was completed in the clinic today. The patient reports that he has  not been taking Jardiance  10 mg daily for the last few months due to a high copay and loss of insurance coverage. 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 The necessary orders have been placed, and the patient was provided with a sample of Jardiance  10 mg today.

## 2023-11-02 NOTE — Telephone Encounter (Signed)
Patient made it to appt today.

## 2023-11-02 NOTE — Progress Notes (Signed)
 Established Patient Office Visit  Subjective:  Patient ID: Robert Mcintyre, male    DOB: July 03, 1966  Age: 58 y.o. 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 male with past medical history of hypertension, type II diabetes, and hyperlipidemia presents for f/u of  chronic medical conditions. For the details of today's visit, please refer to the assessment and plan.    Past Medical History:  Diagnosis Date   Arthritis    back (had surgery 98), knees   Asthma    uses albuterol  during cold weather   Diabetes mellitus without complication (HCC)    Type 2   Hypertension    Morbid obesity (HCC)    Periumbilical hernia    pt unaware   Pneumonia    Prostate cancer (HCC)    prostate cancer around 2018, had surgery   Pure hypercholesterolemia 04/24/2020   Spondylolysis, lumbar region     Past Surgical History:  Procedure Laterality Date   COLONOSCOPY N/A 09/16/2016   Procedure: COLONOSCOPY;  Surgeon: Margo LITTIE Haddock, MD;  [facility];  Service: Endoscopy;  Laterality: N/A;  1:00 PM   LYMPHADENECTOMY Bilateral 04/09/2018   Procedure: LYMPHADENECTOMY;  Surgeon: Renda Glance, MD;  Location: [facility];  Service: Urology;  Laterality: Bilateral;   POLYPECTOMY  09/16/2016   Procedure: POLYPECTOMY;  Surgeon: Margo LITTIE Haddock, MD;  [facility];  Service: Endoscopy;;  ascending colon polyp, hepatic flexure polyp, descending colon polyp,    ROBOT ASSISTED LAPAROSCOPIC RADICAL PROSTATECTOMY N/A 04/09/2018   Procedure: XI ROBOTIC ASSISTED LAPAROSCOPIC RADICAL PROSTATECTOMY LEVEL 3;  Surgeon: Renda Glance, MD;  Location: [facility];  Service: Urology;  Laterality: N/A;  ONLY NEEDS 210 MIN FOR ALL PROCEDURES   SHOULDER ARTHROSCOPY WITH SUBACROMIAL DECOMPRESSION, ROTATOR CUFF REPAIR AND BICEP TENDON REPAIR Left 07/20/2023   Procedure: LEFT SHOULDER ARTHROSCOPY, DEBRIDEMENT, BICEPS TENODESIS,  MINI OPEN ROTATOR CUFF TEAR REPAIR;  Surgeon: Addie Cordella Hamilton, MD;  Location: [facility];  Service: Orthopedics;  Laterality: Left;   SPINE SURGERY  05/1997   back fusion    Family History  Problem Relation Age of Onset   Heart disease Mother    Stroke Mother    Hypertension Mother    Diabetes Mother    Asthma Father    Hypertension Brother     Social History   Socioeconomic History   Marital status: Single    Spouse name: Not on file   Number of children: 0   Years of education: 12   Highest education level: Not on file  Occupational History   Occupation: truck driver  Tobacco Use   Smoking status: Former    Current packs/day: 0.00    Average packs/day: 0.3 packs/day for 3.0 years (0.8 ttl pk-yrs)    Types: Cigarettes    Start date: 62    Quit date: 1980    Years since quitting: 45.1   Smokeless tobacco: Never  Vaping Use   Vaping status: Never Used  Substance and Sexual Activity   Alcohol use: Not Currently    Comment: occasionally   Drug use: No   Sexual activity: Not Currently  Other Topics Concern   Not on file  Social History Narrative   Lives with brother   Social Drivers of Health   Financial Resource Strain: Not on file  Food Insecurity: Not on  file  Transportation Needs: Not on file  Physical Activity: Not on file  Stress: Not on file  Social Connections: Not on file  Intimate Partner Violence: Not on file    Outpatient Medications Prior to Visit  Medication Sig Dispense Refill   albuterol  (PROAIR  HFA) 108 (90 Base) MCG/ACT inhaler Inhale 2 puffs into the lungs every 6 (six) hours as needed for wheezing or shortness of breath. 18 g 0   atorvastatin  (LIPITOR) 10 MG tablet Take 1 tablet (10 mg total) by mouth daily. 90 tablet 3   metFORMIN  (GLUCOPHAGE ) 1000 MG tablet Take 1 tablet (1,000 mg total) by mouth 2 (two) times daily with a meal. 180 tablet 3   QUEtiapine  (SEROQUEL ) 50 MG tablet Take 1 tablet (50 mg total) by mouth at bedtime. 30 tablet  0   amlodipine -olmesartan  (AZOR ) 10-20 MG tablet Take 1 tablet by mouth daily. 90 tablet 0   ANDROGEL  PUMP 20.25 MG/ACT (1.62%) GEL Apply 3 Pump topically in the morning. (Patient not taking: Reported on 11/02/2023)     blood glucose meter kit and supplies Per insurance preference. Check blood glucose once a day. Dx E11.65 1 each 11   capsaicin  (ZOSTRIX) 0.025 % cream Apply topically 2 (two) times daily. 60 g 0   glimepiride  (AMARYL ) 4 MG tablet Take 1 tablet (4 mg total) by mouth daily before breakfast. (Patient not taking: Reported on 11/02/2023) 30 tablet 3   Vitamin D , Ergocalciferol , (DRISDOL ) 1.25 MG (50000 UNIT) CAPS capsule Take 1 capsule (50,000 Units total) by mouth every 7 (seven) days. (Patient not taking: Reported on 11/02/2023) 8 capsule 2   celecoxib  (CELEBREX ) 100 MG capsule Take 1 capsule (100 mg total) by mouth 2 (two) times daily. 60 capsule 0   empagliflozin  (JARDIANCE ) 10 MG TABS tablet Take 1 tablet (10 mg total) by mouth daily before breakfast. (Patient not taking: Reported on 11/02/2023) 90 tablet 1   HYDROcodone -acetaminophen  (NORCO/VICODIN) 5-325 MG tablet Take 1 tablet by mouth every 6 (six) hours as needed for moderate pain (pain score 4-6). Do not take with oxycodone . 30 tablet 0   methocarbamol  (ROBAXIN ) 500 MG tablet Take 1 tablet (500 mg total) by mouth every 8 (eight) hours as needed for muscle spasms. 30 tablet 1   No facility-administered medications prior to visit.    Allergies  Allergen Reactions   Egg-Derived Products Nausea And Vomiting    ROS Review of Systems  Constitutional:  Negative for fatigue and fever.  Eyes:  Negative for visual disturbance.  Respiratory:  Negative for chest tightness and shortness of breath.   Cardiovascular:  Negative for chest pain and palpitations.  Neurological:  Negative for dizziness and headaches.      Objective:    Physical Exam HENT:     Head: Normocephalic.     Right Ear: External ear normal.     Left Ear:  External ear normal.     Nose: No congestion or rhinorrhea.     Mouth/Throat:     Mouth: Mucous membranes are moist.  Cardiovascular:     Rate and Rhythm: Regular rhythm.     Heart sounds: No murmur heard. Pulmonary:     Effort: No respiratory distress.     Breath sounds: Normal breath sounds.  Neurological:     Mental Status: He is alert.     BP 130/80   Pulse 88   Resp 16   Ht 5' 8.5 (1.74 m)   Wt 292 lb (132.5 kg)   SpO2 95%  BMI 43.75 kg/m  Wt Readings from Last 3 Encounters:  11/02/23 292 lb (132.5 kg)  07/20/23 300 lb (136.1 kg)  07/14/23 (!) 301 lb 1.6 oz (136.6 kg)    Lab Results  Component Value Date   TSH 1.250 05/26/2023   Lab Results  Component Value Date   WBC 9.2 07/14/2023   HGB 13.5 07/14/2023   HCT 42.4 07/14/2023   MCV 91.6 07/14/2023   PLT 293 07/14/2023   Lab Results  Component Value Date   NA 139 07/14/2023   K 3.8 07/14/2023   CO2 25 07/14/2023   GLUCOSE 133 (H) 07/14/2023   BUN 11 07/14/2023   CREATININE 1.15 07/14/2023   BILITOT 0.6 05/26/2023   ALKPHOS 96 05/26/2023   AST 11 05/26/2023   ALT 14 05/26/2023   PROT 7.1 05/26/2023   ALBUMIN 4.1 05/26/2023   CALCIUM  9.6 07/14/2023   ANIONGAP 11 07/14/2023   EGFR 73 05/26/2023   Lab Results  Component Value Date   CHOL 139 05/26/2023   Lab Results  Component Value Date   HDL 37 (L) 05/26/2023   Lab Results  Component Value Date   LDLCALC 86 05/26/2023   Lab Results  Component Value Date   TRIG 81 05/26/2023   Lab Results  Component Value Date   CHOLHDL 3.8 05/26/2023   Lab Results  Component Value Date   HGBA1C 8.8 (H) 05/26/2023      Assessment & Plan:  Type 2 diabetes mellitus with hyperglycemia, without long-term current use of insulin  (HCC) Assessment & Plan: A diabetic foot exam was completed in the clinic today. The patient reports that he has  not been taking Jardiance  10 mg daily for the last few months due to a high copay and loss of insurance  coverage. 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 The necessary orders have been placed, and the patient was provided with a sample of Jardiance  10 mg today.    Orders: -     [facility] Diabetes Foot Exam -     Empagliflozin ; Take 1 tablet (10 mg total) by mouth daily before breakfast.  Essential hypertension Assessment & Plan: The patient's blood pressure is controlled. He is encouraged to continue his treatment regimen of amlodipine -olmesartan  10-20 mg daily. A low-sodium diet of less than 2,300 mg daily is recommended, along with increased physical activity of moderate intensity, aiming for 150 minutes per week. The patient is encouraged to maintain these lifestyle modifications to help manage his blood pressure effectively.  BP Readings from Last 3 Encounters:  11/02/23 130/80  07/20/23 (!) 153/94  07/14/23 134/82     Orders: -     amLODIPine -Olmesartan ; Take 1 tablet by mouth daily.  Dispense: 90 tablet; Refill: 1  Onychomycosis Assessment & Plan: Will initiate therapy with Lamisil , and topical application instructions have been provided.    Orders: -     Terbinafine  HCl; Apply 1 Application topically 2 (two) times daily.  Dispense: 42 g; Refill: 2  IFG (impaired fasting glucose) -     Hemoglobin A1c  Vitamin D  deficiency -     VITAMIN D  25 Hydroxy (Vit-D Deficiency, Fractures)  TSH (thyroid -stimulating hormone deficiency) -     TSH + free T4  Other hyperlipidemia -  Lipid panel -     CMP14+EGFR -     CBC with Differential/Platelet  Note: This chart has been completed using [employer] (consulting) software, and while attempts have been made to ensure accuracy, certain words and phrases may not be transcribed as intended.    Follow-up: Return in about 4 months  (around 03/01/2024).   German Manke, FNP

## 2023-11-02 NOTE — Assessment & Plan Note (Signed)
 The patient's blood pressure is controlled. He is encouraged to continue his treatment regimen of amlodipine -olmesartan  10-20 mg daily. A low-sodium diet of less than 2,300 mg daily is recommended, along with increased physical activity of moderate intensity, aiming for 150 minutes per week. The patient is encouraged to maintain these lifestyle modifications to help manage his blood pressure effectively.  BP Readings from Last 3 Encounters:  11/02/23 130/80  07/20/23 (!) 153/94  07/14/23 134/82

## 2023-11-03 LAB — CBC WITH DIFFERENTIAL/PLATELET
Basophils Absolute: 0 10*3/uL (ref 0.0–0.2)
Basos: 0 %
EOS (ABSOLUTE): 0.4 10*3/uL (ref 0.0–0.4)
Eos: 7 %
Hematocrit: 42.1 % (ref 37.5–51.0)
Hemoglobin: 14 g/dL (ref 13.0–17.7)
Immature Grans (Abs): 0 10*3/uL (ref 0.0–0.1)
Immature Granulocytes: 0 %
Lymphocytes Absolute: 1.1 10*3/uL (ref 0.7–3.1)
Lymphs: 21 %
MCH: 29.6 pg (ref 26.6–33.0)
MCHC: 33.3 g/dL (ref 31.5–35.7)
MCV: 89 fL (ref 79–97)
Monocytes Absolute: 0.6 10*3/uL (ref 0.1–0.9)
Monocytes: 10 %
Neutrophils Absolute: 3.4 10*3/uL (ref 1.4–7.0)
Neutrophils: 62 %
Platelets: 303 10*3/uL (ref 150–450)
RBC: 4.73 x10E6/uL (ref 4.14–5.80)
RDW: 12.9 % (ref 11.6–15.4)
WBC: 5.5 10*3/uL (ref 3.4–10.8)

## 2023-11-03 LAB — CMP14+EGFR
ALT: 8 [IU]/L (ref 0–44)
AST: 12 [IU]/L (ref 0–40)
Albumin: 4.1 g/dL (ref 3.8–4.9)
Alkaline Phosphatase: 121 [IU]/L (ref 44–121)
BUN/Creatinine Ratio: 10 (ref 9–20)
BUN: 12 mg/dL (ref 6–24)
Bilirubin Total: 0.5 mg/dL (ref 0.0–1.2)
CO2: 23 mmol/L (ref 20–29)
Calcium: 9.7 mg/dL (ref 8.7–10.2)
Chloride: 104 mmol/L (ref 96–106)
Creatinine, Ser: 1.24 mg/dL (ref 0.76–1.27)
Globulin, Total: 3 g/dL (ref 1.5–4.5)
Glucose: 136 mg/dL — ABNORMAL HIGH (ref 70–99)
Potassium: 4.2 mmol/L (ref 3.5–5.2)
Sodium: 143 mmol/L (ref 134–144)
Total Protein: 7.1 g/dL (ref 6.0–8.5)
eGFR: 67 mL/min/{1.73_m2} (ref >59)

## 2023-11-03 LAB — VITAMIN D 25 HYDROXY (VIT D DEFICIENCY, FRACTURES): Vit D, 25-Hydroxy: 27.9 ng/mL — ABNORMAL LOW (ref 30.0–100.0)

## 2023-11-03 LAB — TSH+FREE T4
Free T4: 1.35 ng/dL (ref 0.82–1.77)
TSH: 2.05 u[IU]/mL (ref 0.450–4.500)

## 2023-11-03 LAB — LIPID PANEL
Chol/HDL Ratio: 3.4 {ratio} (ref 0.0–5.0)
Cholesterol, Total: 125 mg/dL (ref 100–199)
HDL: 37 mg/dL — ABNORMAL LOW (ref >39)
LDL Chol Calc (NIH): 75 mg/dL (ref 0–99)
Triglycerides: 58 mg/dL (ref 0–149)
VLDL Cholesterol Cal: 13 mg/dL (ref 5–40)

## 2023-11-03 LAB — HEMOGLOBIN A1C
Est. average glucose Bld gHb Est-mCnc: 174 mg/dL
Hgb A1c MFr Bld: 7.7 % — ABNORMAL HIGH (ref 4.8–5.6)

## 2023-11-05 ENCOUNTER — Encounter: Payer: Self-pay | Admitting: Family Medicine

## 2023-11-13 ENCOUNTER — Other Ambulatory Visit: Payer: Self-pay | Admitting: Family Medicine

## 2023-11-13 DIAGNOSIS — G4700 Insomnia, unspecified: Secondary | ICD-10-CM

## 2023-11-14 MED ORDER — QUETIAPINE FUMARATE 50 MG PO TABS: 50.0000 mg | ORAL_TABLET | Freq: Every day | ORAL | 0 refills | Status: DC

## 2024-01-06 ENCOUNTER — Other Ambulatory Visit: Payer: Self-pay | Admitting: Family Medicine

## 2024-01-06 DIAGNOSIS — G4700 Insomnia, unspecified: Secondary | ICD-10-CM

## 2024-01-07 ENCOUNTER — Other Ambulatory Visit: Payer: Self-pay | Admitting: Family Medicine

## 2024-01-07 DIAGNOSIS — G4700 Insomnia, unspecified: Secondary | ICD-10-CM

## 2024-01-08 MED ORDER — QUETIAPINE FUMARATE 50 MG PO TABS: 50.0000 mg | ORAL_TABLET | Freq: Every day | ORAL | 0 refills | Status: DC

## 2024-02-12 ENCOUNTER — Other Ambulatory Visit: Payer: Self-pay | Admitting: Family Medicine

## 2024-02-12 DIAGNOSIS — G4700 Insomnia, unspecified: Secondary | ICD-10-CM

## 2024-02-15 ENCOUNTER — Other Ambulatory Visit: Payer: Self-pay | Admitting: Family Medicine

## 2024-03-01 ENCOUNTER — Ambulatory Visit (INDEPENDENT_AMBULATORY_CARE_PROVIDER_SITE_OTHER): Payer: Managed Care, Other (non HMO) | Admitting: Family Medicine

## 2024-03-01 ENCOUNTER — Encounter: Payer: Self-pay | Admitting: Family Medicine

## 2024-03-01 VITALS — BP 145/82 | HR 79 | Temp 98.4°F | Resp 16 | Ht 68.0 in | Wt 298.8 lb

## 2024-03-01 DIAGNOSIS — B349 Viral infection, unspecified: Secondary | ICD-10-CM

## 2024-03-01 DIAGNOSIS — J302 Other seasonal allergic rhinitis: Secondary | ICD-10-CM | POA: Diagnosis not present

## 2024-03-01 MED ORDER — ALBUTEROL SULFATE HFA 108 (90 BASE) MCG/ACT IN AERS
2.0000 | INHALATION_SPRAY | Freq: Four times a day (QID) | RESPIRATORY_TRACT | 1 refills | Status: AC | PRN
Start: 2024-03-01 — End: ?

## 2024-03-01 MED ORDER — PROMETHAZINE-DM 6.25-15 MG/5ML PO SYRP: 5.0000 mL | ORAL_SOLUTION | Freq: Four times a day (QID) | ORAL | 0 refills | Status: DC | PRN

## 2024-03-01 MED ORDER — PREDNISONE 20 MG PO TABS: 40.0000 mg | ORAL_TABLET | Freq: Every day | ORAL | 0 refills | Status: AC

## 2024-03-01 NOTE — Patient Instructions (Addendum)
 I appreciate the opportunity to provide care to you today!   Diarrhea -start taking imodium after each loose stool -Stay hydrated by drinking at least 64 ounces of water  daily. I recommend dietary modifications, including increasing your intake of boiled starches and cereals (e.g., potatoes, noodles, rice, wheat, and oats) with added salt. You may also consume crackers, bananas, soup, and boiled vegetables. Avoid foods high in fat until your gut function returns to normal following this episode of diarrhea.  Viral Illness Management: -Please start taking prednisone  40 mg daily for 5 days to reduce inflammation in your lower airways. -Promethazine DM has been ordered to help alleviate your cough; please take the medication as prescribed. -Increase your fluid intake and ensure you get plenty of rest. -You may use Tylenol   as needed for pain, fever, or general discomfort. -Gargle with warm salt water  3-4 times daily to help with throat pain or discomfort. -Using a humidifier at bedtime may also aid in relieving cough and nasal congestion. Please follow up if your symptoms do not improve.   Seek urgent care if you experience fever, chills, or notice blood, pus, or mucus in your stool, along with severe nausea, vomiting, or diarrhea.   Please follow up if your symptoms worsen or fail to improve.   Please continue to a heart-healthy diet and increase your physical activities. Try to exercise for at least five days a week.    It was a pleasure to see you and I look forward to continuing to work together on your health and well-being. Please do not hesitate to call the office if you need care or have questions about your care.  In case of emergency, please visit the Emergency Department for urgent care, or contact our clinic at [phone] to schedule an appointment. We're here to help you!   Have a wonderful day and week. With Gratitude, Lennan Malone MSN, FNP-BC

## 2024-03-01 NOTE — Progress Notes (Signed)
 Acute Office Visit  Subjective:    Patient ID: Robert Mcintyre, male    DOB: 1965/11/11, 58 y.o.   MRN: [id number]  Chief Complaint  Patient presents with   Cough    Non productive cough, chest tightness, SOB, no appetite, loose stool x Sun. Feels like its starting to break up in his chest.     HPI Patient is in today with the above complaints. For the details of today's visit, please refer to the assessment and plan.     Past Medical History:  Diagnosis Date   Arthritis    back (had surgery 98), knees   Asthma    uses albuterol  during cold weather   Diabetes mellitus without complication (HCC)    Type 2   Hypertension    Morbid obesity (HCC)    Periumbilical hernia    pt unaware   Pneumonia    Prostate cancer (HCC)    prostate cancer around 2018, had surgery   Pure hypercholesterolemia 04/24/2020   Spondylolysis, lumbar region     Past Surgical History:  Procedure Laterality Date   COLONOSCOPY N/A 09/16/2016   Procedure: COLONOSCOPY;  Surgeon: Alyce Jubilee, MD;  Location: [facility];  Service: Endoscopy;  Laterality: N/A;  1:00 PM   LYMPHADENECTOMY Bilateral 04/09/2018   Procedure: LYMPHADENECTOMY;  Surgeon: Florencio Hunting, MD;  Location: [facility];  Service: Urology;  Laterality: Bilateral;   POLYPECTOMY  09/16/2016   Procedure: POLYPECTOMY;  Surgeon: Alyce Jubilee, MD;  Location: [facility];  Service: Endoscopy;;  ascending colon polyp, hepatic flexure polyp, descending colon polyp,    ROBOT ASSISTED LAPAROSCOPIC RADICAL PROSTATECTOMY N/A 04/09/2018   Procedure: XI ROBOTIC ASSISTED LAPAROSCOPIC RADICAL PROSTATECTOMY LEVEL 3;  Surgeon: Florencio Hunting, MD;  Location: [facility];  Service: Urology;  Laterality: N/A;  ONLY NEEDS 210 MIN FOR ALL PROCEDURES   SHOULDER ARTHROSCOPY WITH SUBACROMIAL DECOMPRESSION, ROTATOR CUFF REPAIR AND BICEP TENDON REPAIR Left 07/20/2023   Procedure: LEFT SHOULDER ARTHROSCOPY, DEBRIDEMENT, BICEPS TENODESIS, MINI OPEN ROTATOR CUFF TEAR REPAIR;   Surgeon: Jasmine Mesi, MD;  Location: [facility];  Service: Orthopedics;  Laterality: Left;   SPINE SURGERY  05/1997   back fusion    Family History  Problem Relation Age of Onset   Heart disease Mother    Stroke Mother    Hypertension Mother    Diabetes Mother    Asthma Father    Hypertension Brother     Social History   Socioeconomic History   Marital status: Single    Spouse name: Not on file   Number of children: 0   Years of education: 12   Highest education level: Not on file  Occupational History   Occupation: truck driver  Tobacco Use   Smoking status: Former    Current packs/day: 0.00    Average packs/day: 0.3 packs/day for 3.0 years (0.8 ttl pk-yrs)    Types: Cigarettes    Start date: 31    Quit date: 1980    Years since quitting: 45.4   Smokeless tobacco: Never  Vaping Use   Vaping status: Never Used  Substance and Sexual Activity   Alcohol use: Not Currently    Comment: occasionally   Drug use: No   Sexual activity: Not Currently  Other Topics Concern   Not on file  Social History Narrative   Lives with brother   Social Drivers of Corporate investment banker Strain: Not on file  Food Insecurity: Not on file  Transportation Needs: Not on file  Physical Activity: Not on file  Stress: Not on file  Social Connections: Not on file  Intimate Partner Violence: Not on file    Outpatient Medications Prior to Visit  Medication Sig Dispense Refill   amlodipine -olmesartan  (AZOR ) 10-20 MG tablet Take 1 tablet by mouth daily. 90 tablet 1   ANDROGEL  PUMP 20.25 MG/ACT (1.62%) GEL Apply 3 Pump topically in the morning.     atorvastatin  (LIPITOR) 10 MG tablet Take 1 tablet (10 mg total) by mouth daily. 90 tablet 3   blood glucose meter kit and supplies Per insurance preference. Check blood glucose once a day. Dx E11.65 1 each 11   capsaicin  (ZOSTRIX) 0.025 % cream Apply topically 2 (two) times daily. 60 g 0   empagliflozin  (JARDIANCE ) 10 MG TABS tablet  Take 1 tablet (10 mg total) by mouth daily before breakfast.     glimepiride  (AMARYL ) 4 MG tablet Take 1 tablet (4 mg total) by mouth daily before breakfast. 30 tablet 3   metFORMIN  (GLUCOPHAGE ) 1000 MG tablet TAKE 1 TABLET BY MOUTH TWICE DAILY WITH A MEAL 180 tablet 0   QUEtiapine  (SEROQUEL ) 50 MG tablet TAKE 1 TABLET BY MOUTH AT BEDTIME 30 tablet 0   terbinafine  (LAMISIL ) 1 % cream Apply 1 Application topically 2 (two) times daily. 42 g 2   Vitamin D , Ergocalciferol , (DRISDOL ) 1.25 MG (50000 UNIT) CAPS capsule Take 1 capsule (50,000 Units total) by mouth every 7 (seven) days. 8 capsule 2   albuterol  (PROAIR  HFA) 108 (90 Base) MCG/ACT inhaler Inhale 2 puffs into the lungs every 6 (six) hours as needed for wheezing or shortness of breath. 18 g 0   No facility-administered medications prior to visit.    Allergies  Allergen Reactions   Egg-Derived Products Nausea And Vomiting    Review of Systems  Constitutional:  Negative for fatigue and fever.  HENT:  Positive for congestion.   Eyes:  Negative for visual disturbance.  Respiratory:  Positive for cough. Negative for chest tightness and shortness of breath.   Cardiovascular:  Negative for chest pain and palpitations.  Neurological:  Negative for dizziness and headaches.       Objective:     Physical Exam HENT:     Head: Normocephalic.     Right Ear: External ear normal.     Left Ear: External ear normal.     Nose: No congestion or rhinorrhea.     Mouth/Throat:     Mouth: Mucous membranes are moist.  Cardiovascular:     Rate and Rhythm: Regular rhythm.     Heart sounds: No murmur heard. Pulmonary:     Effort: No respiratory distress.     Breath sounds: Normal breath sounds.  Neurological:     Mental Status: He is alert.     BP (!) 145/82   Pulse 79   Temp 98.4 F (36.9 C) (Oral)   Resp 16   Ht 5\' 8"  (1.727 m)   Wt 298 lb 12.8 oz (135.5 kg)   SpO2 94%   BMI 45.43 kg/m  Wt Readings from Last 3 Encounters:   03/01/24 298 lb 12.8 oz (135.5 kg)  11/02/23 292 lb (132.5 kg)  07/20/23 300 lb (136.1 kg)       Assessment & Plan:  Viral illness Assessment & Plan: Discussed Diarrhea Management: Start Imodium after each loose stool. Drink at least 64 oz of water  daily. Follow a bland diet: boiled starches/cereals (e.g., rice, oats, noodles), crackers, bananas,  soup, and boiled vegetables. Avoid high-fat foods until symptoms resolve.  Discussed Viral Illness Treatment: Start Prednisone  40 mg daily x5 days. Take Promethazine  DM as prescribed for cough. Use Tylenol  as needed for fever or discomfort. Gargle with warm salt water  3-4 times daily. Use a humidifier at night. Rest and stay well-hydrated.  Discussed Follow-Up: Seek urgent care for fever, chills, or blood/pus in stool, or severe nausea, vomiting, or diarrhea. Follow up if symptoms do not improve.   Orders: -     predniSONE ; Take 2 tablets (40 mg total) by mouth daily for 5 days.  Dispense: 10 tablet; Refill: 0 -     Promethazine -DM; Take 5 mLs by mouth 4 (four) times daily as needed.  Dispense: 118 mL; Refill: 0 -     COVID-19, Flu A+B and RSV  Seasonal allergies -     Albuterol  Sulfate HFA; Inhale 2 puffs into the lungs every 6 (six) hours as needed for wheezing or shortness of breath.  Dispense: 18 g; Refill: 1   Note: This chart has been completed using Engineer, civil (consulting) software, and while attempts have been made to ensure accuracy, certain words and phrases may not be transcribed as intended.   Celedonio Sortino, FNP

## 2024-03-03 DIAGNOSIS — B349 Viral infection, unspecified: Secondary | ICD-10-CM | POA: Insufficient documentation

## 2024-03-03 NOTE — Assessment & Plan Note (Addendum)
 Discussed Diarrhea Management: Start Imodium after each loose stool. Drink at least 64 oz of water  daily. Follow a bland diet: boiled starches/cereals (e.g., rice, oats, noodles), crackers, bananas, soup, and boiled vegetables. Avoid high-fat foods until symptoms resolve.  Discussed Viral Illness Treatment: Start Prednisone  40 mg daily x5 days. Take Promethazine  DM as prescribed for cough. Use Tylenol  as needed for fever or discomfort. Gargle with warm salt water  3-4 times daily. Use a humidifier at night. Rest and stay well-hydrated.  Discussed Follow-Up: Seek urgent care for fever, chills, or blood/pus in stool, or severe nausea, vomiting, or diarrhea. Follow up if symptoms do not improve.

## 2024-03-05 LAB — COVID-19, FLU A+B AND RSV
Influenza A, NAA: NOT DETECTED
Influenza B, NAA: NOT DETECTED
RSV, NAA: NOT DETECTED
SARS-CoV-2, NAA: NOT DETECTED

## 2024-03-11 ENCOUNTER — Ambulatory Visit: Payer: Self-pay | Admitting: Family Medicine

## 2024-06-19 ENCOUNTER — Ambulatory Visit (INDEPENDENT_AMBULATORY_CARE_PROVIDER_SITE_OTHER): Payer: Self-pay

## 2024-06-19 VITALS — BP 140/90 | HR 85 | Resp 18 | Ht 68.0 in | Wt 294.1 lb

## 2024-06-19 DIAGNOSIS — Z23 Encounter for immunization: Secondary | ICD-10-CM | POA: Diagnosis not present

## 2024-06-19 DIAGNOSIS — E7800 Pure hypercholesterolemia, unspecified: Secondary | ICD-10-CM | POA: Diagnosis not present

## 2024-06-19 DIAGNOSIS — G4700 Insomnia, unspecified: Secondary | ICD-10-CM | POA: Diagnosis not present

## 2024-06-19 DIAGNOSIS — E1165 Type 2 diabetes mellitus with hyperglycemia: Secondary | ICD-10-CM | POA: Diagnosis not present

## 2024-06-19 DIAGNOSIS — I10 Essential (primary) hypertension: Secondary | ICD-10-CM

## 2024-06-19 DIAGNOSIS — Z7985 Long-term (current) use of injectable non-insulin antidiabetic drugs: Secondary | ICD-10-CM

## 2024-06-19 DIAGNOSIS — E559 Vitamin D deficiency, unspecified: Secondary | ICD-10-CM

## 2024-06-19 MED ORDER — ATORVASTATIN CALCIUM 10 MG PO TABS: 10.0000 mg | ORAL_TABLET | Freq: Every day | ORAL | 3 refills | Status: DC

## 2024-06-19 MED ORDER — AMLODIPINE-OLMESARTAN 10-20 MG PO TABS: 1.0000 | ORAL_TABLET | Freq: Every day | ORAL | 1 refills | Status: DC

## 2024-06-19 MED ORDER — QUETIAPINE FUMARATE 25 MG PO TABS: 25.0000 mg | ORAL_TABLET | Freq: Every day | ORAL | 1 refills | Status: DC

## 2024-06-19 NOTE — Progress Notes (Signed)
 Established Patient Office Visit  Subjective   Patient ID: Robert Mcintyre, male    DOB: 1966/06/16  Age: 58 y.o. MRN: [id number]  Chief Complaint  Patient presents with   Medical Management of Chronic Issues    Has been out of multiple medications, wants to check cholesterol and A1c levels     HPI Discussed the use of AI scribe software for clinical note transcription with the patient, who gave verbal consent to proceed.  History of Present Illness   Robert Mcintyre is a 58 year old male with hypertension and diabetes who presents for medication refills and blood work.  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 - Had planned blood work last Friday but appointment was canceled; plans to return for testing this Friday  Hyperlipidemia management - Currently out of atorvastatin  and requires a refill - Consumed crackers, cheese sticks, and coffee with cream earlier in the day, which may affect lipid panel results - Has been drinking water  throughout the day  Sedative side effects - Takes Seroquel , prescribed at 50 mg, but usually cuts the pill in half to avoid excessive sleepiness - Full 50 mg dose results in 8-9 hours of sleep - Prefers a 25 mg dose to minimize sedation and avoid splitting pills      Patient Active Problem List   Diagnosis Date Noted   Viral illness 03/03/2024   Onychomycosis 11/02/2023   Synovitis of left shoulder 07/25/2023   Biceps tendonitis on left 07/25/2023   Complete tear of left rotator cuff 07/25/2023   Chronic left shoulder  pain 02/07/2023   Musculoskeletal pain of upper extremity, left 10/12/2022   Vitamin D  deficiency 06/29/2022   Seasonal allergies 06/29/2022   Encounter for routine adult physical exam with abnormal findings 04/22/2022   Fatigue 04/22/2022   Need for varicella vaccine 11/04/2021   Need for immunization against influenza 11/04/2021   Type 2 diabetes mellitus with hyperglycemia, without long-term current use of insulin  (HCC) 11/04/2021   Chronic left-sided low back pain with left-sided sciatica 09/06/2021   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) 06/07/2021   Mass of skin of back 11/13/2020   Hyperlipidemia 10/16/2020   Insomnia, unspecified 10/16/2020   Pure hypercholesterolemia 04/24/2020   Poorly controlled diabetes mellitus (HCC) 01/25/2019   Prostate cancer (HCC) 04/09/2018   Essential hypertension 03/20/2017   Tubular adenoma of colon 09/20/2016   Encounter to establish care    Morbid obesity (HCC) 07/18/2016   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    ROS    Objective:     BP (!) 140/90 (BP Location: Left Arm, Patient Position: Sitting, Cuff Size: Normal)   Pulse 85   Resp 18   Ht 5' 8 (1.727 m)   Wt 294 lb 1.3 oz (133.4 kg)   SpO2 94%   BMI 44.71 kg/m  BP Readings from Last 3 Encounters:  06/19/24 (!) 140/90  03/01/24 (!) 145/82  11/02/23 130/80   Wt Readings from Last 3 Encounters:  06/19/24 294 lb 1.3 oz (133.4 kg)  03/01/24 298 lb 12.8 oz (135.5 kg)  11/02/23 292 lb (132.5 kg)  Physical Exam Vitals and nursing note reviewed.  Constitutional:      Appearance: Normal appearance.  HENT:     Head: Normocephalic.     Right Ear: Tympanic membrane, ear canal and external ear normal.     Left Ear: Tympanic membrane, ear canal and external ear normal.     Nose: Nose normal.     Mouth/Throat:     Mouth: Mucous membranes are moist.     Pharynx: Oropharynx is clear.  Cardiovascular:     Rate and Rhythm: Normal  rate and regular rhythm.  Pulmonary:     Effort: Pulmonary effort is normal.     Breath sounds: Normal breath sounds.  Musculoskeletal:     Cervical back: Normal range of motion and neck supple.  Skin:    General: Skin is warm and dry.  Neurological:     Mental Status: He is alert and oriented to person, place, and time.  Psychiatric:        Mood and Affect: Mood normal.        Thought Content: Thought content normal.      Last CBC Lab Results  Component Value Date   WBC 5.5 11/02/2023   HGB 14.0 11/02/2023   HCT 42.1 11/02/2023   MCV 89 11/02/2023   MCH 29.6 11/02/2023   RDW 12.9 11/02/2023   PLT 303 11/02/2023   Last metabolic panel Lab Results  Component Value Date   GLUCOSE 148 (H) 06/21/2024   NA 141 06/21/2024   K 4.4 06/21/2024   CL 102 06/21/2024   CO2 25 06/21/2024   BUN 13 06/21/2024   CREATININE 1.26 06/21/2024   EGFR 65 06/21/2024   CALCIUM  9.6 06/21/2024   PROT 6.9 06/21/2024   ALBUMIN 4.1 06/21/2024   LABGLOB 2.8 06/21/2024   AGRATIO 1.5 02/17/2023   BILITOT 0.7 06/21/2024   ALKPHOS 100 06/21/2024   AST 12 06/21/2024   ALT 14 06/21/2024   ANIONGAP 11 07/14/2023   Last lipids Lab Results  Component Value Date   CHOL 192 06/21/2024   HDL 38 (L) 06/21/2024   LDLCALC 142 (H) 06/21/2024   TRIG 67 06/21/2024   CHOLHDL 5.1 (H) 06/21/2024   Last hemoglobin A1c Lab Results  Component Value Date   HGBA1C 7.8 (H) 06/21/2024   Last thyroid  functions Lab Results  Component Value Date   TSH 2.050 11/02/2023   Last vitamin D  Lab Results  Component Value Date   VD25OH 20.8 (L) 06/21/2024   Last vitamin B12 and Folate No results found for: VITAMINB12, FOLATE    The 10-year ASCVD risk score (Arnett DK, et al., 2019) is: 29.5%    Assessment & Plan:   Problem List Items Addressed This Visit       Cardiovascular and Mediastinum   Essential hypertension - Primary   Blood pressure borderline at 140/90 mmHg. - Refill antihypertensive  medication. - He is encouraged to continue his treatment regimen of amlodipine -olmesartan  10-20 mg daily.          Relevant Medications   amlodipine -olmesartan  (AZOR ) 10-20 MG tablet   atorvastatin  (LIPITOR) 10 MG tablet   Other Relevant Orders   CMP14+EGFR (Completed)     Endocrine   Type 2 diabetes mellitus with hyperglycemia, without long-term current use of insulin  (HCC)   Requires updated A1c for DOT certification. - Order A1c test.       Relevant Medications   amlodipine -olmesartan  (AZOR ) 10-20 MG tablet   atorvastatin  (LIPITOR) 10 MG tablet   Other  Relevant Orders   CMP14+EGFR (Completed)   Hemoglobin A1c (Completed)   Microalbumin / creatinine urine ratio (Completed)     Other   Pure hypercholesterolemia   - Will obtain fasting labs today - Refill atorvastatin  10 mg.       Relevant Medications   amlodipine -olmesartan  (AZOR ) 10-20 MG tablet   atorvastatin  (LIPITOR) 10 MG tablet   Other Relevant Orders   CMP14+EGFR (Completed)   Lipid panel (Completed)   Insomnia, unspecified   Halves Seroquel  50 mg due to excessive sleepiness. Prefers 25 mg dose. - Seroquel  25 mg prescribed.      Relevant Medications   QUEtiapine  (SEROQUEL ) 25 MG tablet   Vitamin D  deficiency   Relevant Orders   Vitamin D  (25 hydroxy) (Completed)   Other Visit Diagnoses       Encounter for immunization       Relevant Orders   Flu vaccine trivalent PF, 6mos and older(Flulaval,Afluria,Fluarix,Fluzone) (Completed)         No follow-ups on file.    Leita Longs, FNP

## 2024-06-21 ENCOUNTER — Ambulatory Visit: Payer: Self-pay | Admitting: Family Medicine

## 2024-06-23 ENCOUNTER — Ambulatory Visit: Payer: Self-pay

## 2024-06-23 LAB — CMP14+EGFR
ALT: 14 IU/L (ref 0–44)
AST: 12 IU/L (ref 0–40)
Albumin: 4.1 g/dL (ref 3.8–4.9)
Alkaline Phosphatase: 100 IU/L (ref 47–123)
BUN/Creatinine Ratio: 10 (ref 10–24)
BUN: 13 mg/dL (ref 8–27)
Bilirubin Total: 0.7 mg/dL (ref 0.0–1.2)
CO2: 25 mmol/L (ref 20–29)
Calcium: 9.6 mg/dL (ref 8.6–10.2)
Chloride: 102 mmol/L (ref 96–106)
Creatinine, Ser: 1.26 mg/dL (ref 0.76–1.27)
Globulin, Total: 2.8 g/dL (ref 1.5–4.5)
Glucose: 148 mg/dL — ABNORMAL HIGH (ref 70–99)
Potassium: 4.4 mmol/L (ref 3.5–5.2)
Sodium: 141 mmol/L (ref 134–144)
Total Protein: 6.9 g/dL (ref 6.0–8.5)
eGFR: 65 mL/min/1.73 (ref >59)

## 2024-06-23 LAB — HEMOGLOBIN A1C
Est. average glucose Bld gHb Est-mCnc: 177 mg/dL
Hgb A1c MFr Bld: 7.8 % — ABNORMAL HIGH (ref 4.8–5.6)

## 2024-06-23 LAB — LIPID PANEL
Chol/HDL Ratio: 5.1 ratio — ABNORMAL HIGH (ref 0.0–5.0)
Cholesterol, Total: 192 mg/dL (ref 100–199)
HDL: 38 mg/dL — ABNORMAL LOW (ref >39)
LDL Chol Calc (NIH): 142 mg/dL — ABNORMAL HIGH (ref 0–99)
Triglycerides: 67 mg/dL (ref 0–149)
VLDL Cholesterol Cal: 12 mg/dL (ref 5–40)

## 2024-06-23 LAB — MICROALBUMIN / CREATININE URINE RATIO
Creatinine, Urine: 265.7 mg/dL
Microalb/Creat Ratio: 3 mg/g{creat} (ref 0–29)
Microalbumin, Urine: 8 ug/mL

## 2024-06-23 LAB — VITAMIN D 25 HYDROXY (VIT D DEFICIENCY, FRACTURES): Vit D, 25-Hydroxy: 20.8 ng/mL — ABNORMAL LOW (ref 30.0–100.0)

## 2024-06-23 NOTE — Assessment & Plan Note (Signed)
 Blood pressure borderline at 140/90 mmHg. - Refill antihypertensive medication. - He is encouraged to continue his treatment regimen of amlodipine -olmesartan  10-20 mg daily.

## 2024-06-23 NOTE — Assessment & Plan Note (Signed)
 Requires updated A1c for DOT certification. - Order A1c test.

## 2024-06-23 NOTE — Assessment & Plan Note (Signed)
-   Will obtain fasting labs today - Refill atorvastatin  10 mg.

## 2024-06-23 NOTE — Assessment & Plan Note (Signed)
 Halves Seroquel  50 mg due to excessive sleepiness. Prefers 25 mg dose. - Seroquel  25 mg prescribed.

## 2024-07-29 ENCOUNTER — Encounter: Payer: Self-pay | Admitting: Radiology

## 2024-10-15 NOTE — Progress Notes (Unsigned)
 "  Subjective:    Patient ID: Robert Mcintyre, male    DOB: 05-Mar-1966, 59 y.o.   MRN: [id number]   Chief Complaint: medical management of chronic issues     HPI:  Robert Mcintyre is a 59 y.o. who identifies as a male who was assigned male at birth.   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 Essential hypertension No c/o chest pain, sob or headache. Does not check blood pressure at home. BP Readings from Last 3 Encounters:  06/19/24 (!) 140/90  03/01/24 (!) 145/82  11/02/23 130/80     2. Type 2 diabetes mellitus with hyperglycemia, without long-term current use of insulin  (HCC) 3. 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 Does not check blood sugars very often and is not compliant with diet. Is currently on metformin  1x daily,  no sure if he is taking amaryl . Lab Results  Component Value Date   HGBA1C 7.8 (H) 06/21/2024     4. Hyperlipidemia, unspecified hyperlipidemia type Doe snot watch diet and does no dedicated exercise.is on lipitor daily Lab Results  Component Value Date   CHOL 192 06/21/2024   HDL 38 (L) 06/21/2024   LDLCALC 142 (H) 06/21/2024   TRIG 67 06/21/2024   CHOLHDL 5.1 (H) 06/21/2024     5. Primary insomnia Takes seroquel  for sleep. He usually only takes 1/2 tablet to avoid excessive drowiness.  6. Vitamin D  deficiency Is suppose to be on vitamin d  weekly. Has ran out of meds  7. Morbid obesity (HCC) Weight is up about 12lbs Wt Readings from Last 3 Encounters:  10/16/24 (!) 306 lb (138.8 kg)  06/19/24 294 lb 1.3 oz (133.4 kg)  03/01/24 298 lb 12.8 oz (135.5 kg)      New complaints: None today  Allergies[1] Outpatient Encounter Medications as of 10/16/2024  Medication Sig   albuterol  (PROAIR  HFA) 108 (90 Base) MCG/ACT inhaler Inhale 2 puffs into the lungs every 6 (six)  hours as needed for wheezing or shortness of breath.   amlodipine -olmesartan  (AZOR ) 10-20 MG tablet Take 1 tablet by mouth daily.   ANDROGEL  PUMP 20.25 MG/ACT (1.62%) GEL Apply 3 Pump topically in the morning.   atorvastatin  (LIPITOR) 10 MG tablet Take 1 tablet (10 mg total) by mouth daily.   blood glucose meter kit and supplies Per insurance preference. Check blood glucose once a day. Dx E11.65   capsaicin  (ZOSTRIX) 0.025 % cream Apply topically 2 (two) times daily.   empagliflozin  (JARDIANCE ) 10 MG TABS tablet Take 1 tablet (10 mg total) by mouth daily before breakfast.   glimepiride  (AMARYL ) 4 MG tablet Take 1 tablet (4 mg total) by mouth daily before breakfast.   metFORMIN  (GLUCOPHAGE ) 1000 MG tablet TAKE 1 TABLET BY MOUTH TWICE DAILY WITH A MEAL   QUEtiapine  (SEROQUEL ) 25 MG tablet Take 1 tablet (25 mg total) by mouth at bedtime.   terbinafine  (LAMISIL ) 1 % cream Apply 1 Application topically 2 (two) times daily.   Vitamin D , Ergocalciferol , (DRISDOL ) 1.25 MG (50000 UNIT) CAPS capsule Take 1 capsule (50,000 Units total) by mouth every 7 (seven) days.   No facility-administered encounter medications on file as of 10/16/2024.    Past Surgical History:  Procedure Laterality Date   COLONOSCOPY N/A 09/16/2016   Procedure: COLONOSCOPY;  Surgeon: Margo LITTIE Haddock, MD;  [facility];  Service: Endoscopy;  Laterality: N/A;  1:00 PM   LYMPHADENECTOMY Bilateral 04/09/2018   Procedure: LYMPHADENECTOMY;  Surgeon: Renda Glance, MD;  Location: [facility];  Service: Urology;  Laterality: Bilateral;   POLYPECTOMY  09/16/2016   Procedure: POLYPECTOMY;  Surgeon: Margo LITTIE Haddock, MD;  Location: [facility];  Service: Endoscopy;;  ascending colon polyp, hepatic flexure polyp, descending colon polyp,    ROBOT ASSISTED LAPAROSCOPIC RADICAL PROSTATECTOMY N/A 04/09/2018   Procedure: XI ROBOTIC ASSISTED LAPAROSCOPIC RADICAL PROSTATECTOMY LEVEL 3;  Surgeon: Renda Glance, MD;  Location: [facility];  Service:  Urology;  Laterality: N/A;  ONLY NEEDS 210 MIN FOR ALL PROCEDURES   SHOULDER ARTHROSCOPY WITH SUBACROMIAL DECOMPRESSION, ROTATOR CUFF REPAIR AND BICEP TENDON REPAIR Left 07/20/2023   Procedure: LEFT SHOULDER ARTHROSCOPY, DEBRIDEMENT, BICEPS TENODESIS, MINI OPEN ROTATOR CUFF TEAR REPAIR;  Surgeon: Addie Cordella Hamilton, MD;  Location: [facility];  Service: Orthopedics;  Laterality: Left;   SPINE SURGERY  05/1997   back fusion    Family History  Problem Relation Age of Onset   Heart disease Mother    Stroke Mother    Hypertension Mother    Diabetes Mother    Asthma Father    Hypertension Brother       Controlled substance contract: n/a     Review of Systems  Constitutional:  Negative for diaphoresis.  Eyes:  Negative for pain.  Respiratory:  Negative for shortness of breath.   Cardiovascular:  Negative for chest pain, palpitations and leg swelling.  Gastrointestinal:  Negative for abdominal pain.  Endocrine: Negative for polydipsia.  Skin:  Negative for rash.  Neurological:  Negative for dizziness, weakness and headaches.  Hematological:  Does not bruise/bleed easily.  All other systems reviewed and are negative.      Objective:   Physical Exam Vitals and nursing note reviewed.  Constitutional:      Appearance: Normal appearance. He is well-developed.  HENT:     Head: Normocephalic.     Nose: Nose normal.     Mouth/Throat:     Mouth: Mucous membranes are moist.     Pharynx: Oropharynx is clear.  Eyes:     Pupils: Pupils are equal, round, and reactive to light.  Neck:     Thyroid : No thyroid  mass or thyromegaly.     Vascular: No carotid bruit or JVD.     Trachea: Phonation normal.  Cardiovascular:     Rate and Rhythm: Normal rate and regular rhythm.  Pulmonary:     Effort: Pulmonary effort is normal. No respiratory distress.     Breath sounds: Normal breath sounds.  Abdominal:     General: Bowel sounds are normal.     Palpations: Abdomen is soft.     Tenderness:  There is no abdominal tenderness.  Musculoskeletal:        General: Normal range of motion.     Cervical back: Normal range of motion and neck supple.  Lymphadenopathy:     Cervical: No cervical adenopathy.  Skin:    General: Skin is warm and dry.  Neurological:     Mental Status: He is alert and oriented to person, place, and time.  Psychiatric:        Behavior: Behavior normal.        Thought Content: Thought content normal.        Judgment: Judgment normal.    BP 137/78   Pulse 84   Ht 5' 8 (1.727 m)   Wt (!) 306 lb (138.8  kg)   SpO2 98%   BMI 46.53 kg/m         Assessment & Plan:  Robert Mcintyre comes in today with chief complaint of Medical Management of Chronic Issues (Follow up )   Diagnosis and orders addressed:  1. Essential hypertension (Primary) Low salt diet - CBC with Differential/Platelet - CMP14+EGFR - amlodipine -olmesartan  (AZOR ) 10-20 MG tablet; Take 1 tablet by mouth daily.  Dispense: 90 tablet; Refill: 1  2. Type 2 diabetes mellitus with hyperglycemia, without long-term current use of insulin  (HCC) HGBA1c pending Any need to add amaryl  back if elevated Needs to take metformin  2x a day - Bayer DCA Hb A1c Waived - metFORMIN  (GLUCOPHAGE ) 1000 MG tablet; Take 1 tablet (1,000 mg total) by mouth 2 (two) times daily with a meal.  Dispense: 180 tablet; Refill: 0  3. Hyperlipidemia, unspecified hyperlipidemia type Low fat diet - Lipid panel - atorvastatin  (LIPITOR) 10 MG tablet; Take 1 tablet (10 mg total) by mouth daily.  Dispense: 90 tablet; Refill: 3  4. Primary insomnia Continue seroquel  1-1/2 tablet daily  5. Vitamin D  deficiency Add  vitamin d  back weekly - VITAMIN D  25 Hydroxy (Vit-D Deficiency, Fractures) - Vitamin D , Ergocalciferol , (DRISDOL ) 1.25 MG (50000 UNIT) CAPS capsule; Take 1 capsule (50,000 Units total) by mouth every 7 (seven) days.  Dispense: 8 capsule; Refill: 2  6. Morbid obesity (HCC) Discussed diet and exercise for person  with BMI >25 Will recheck weight in 3-6 months   7. Insomnia, unspecified type Bedtime routine - QUEtiapine  (SEROQUEL ) 25 MG tablet; Take 1 tablet (25 mg total) by mouth at bedtime.  Dispense: 90 tablet; Refill: 1   Labs pending Health Maintenance reviewed Diet and exercise encouraged  Follow up plan: 6 months   Mary-Margaret Gladis, FNP     [1]  Allergies Allergen Reactions   Egg Protein-Containing Drug Products Nausea And Vomiting   "

## 2024-10-16 ENCOUNTER — Ambulatory Visit (INDEPENDENT_AMBULATORY_CARE_PROVIDER_SITE_OTHER): Admitting: Nurse Practitioner

## 2024-10-16 ENCOUNTER — Encounter: Payer: Self-pay | Admitting: Nurse Practitioner

## 2024-10-16 VITALS — BP 137/78 | HR 84 | Ht 68.0 in | Wt 306.0 lb

## 2024-10-16 DIAGNOSIS — Z7984 Long term (current) use of oral hypoglycemic drugs: Secondary | ICD-10-CM

## 2024-10-16 DIAGNOSIS — E785 Hyperlipidemia, unspecified: Secondary | ICD-10-CM

## 2024-10-16 DIAGNOSIS — F5101 Primary insomnia: Secondary | ICD-10-CM

## 2024-10-16 DIAGNOSIS — E7800 Pure hypercholesterolemia, unspecified: Secondary | ICD-10-CM

## 2024-10-16 DIAGNOSIS — G4700 Insomnia, unspecified: Secondary | ICD-10-CM

## 2024-10-16 DIAGNOSIS — E1165 Type 2 diabetes mellitus with hyperglycemia: Secondary | ICD-10-CM

## 2024-10-16 DIAGNOSIS — Z6841 Body Mass Index (BMI) 40.0 and over, adult: Secondary | ICD-10-CM

## 2024-10-16 DIAGNOSIS — E559 Vitamin D deficiency, unspecified: Secondary | ICD-10-CM

## 2024-10-16 DIAGNOSIS — I10 Essential (primary) hypertension: Secondary | ICD-10-CM

## 2024-10-16 MED ORDER — AMLODIPINE-OLMESARTAN 10-20 MG PO TABS: 1.0000 | ORAL_TABLET | Freq: Every day | ORAL | 1 refills | Status: AC

## 2024-10-16 MED ORDER — METFORMIN HCL 1000 MG PO TABS: 1000.0000 mg | ORAL_TABLET | Freq: Two times a day (BID) | ORAL | 0 refills | Status: AC

## 2024-10-16 MED ORDER — ATORVASTATIN CALCIUM 10 MG PO TABS: 10.0000 mg | ORAL_TABLET | Freq: Every day | ORAL | 3 refills | Status: AC

## 2024-10-16 MED ORDER — QUETIAPINE FUMARATE 25 MG PO TABS: 25.0000 mg | ORAL_TABLET | Freq: Every day | ORAL | 1 refills | Status: AC

## 2024-10-16 MED ORDER — VITAMIN D (ERGOCALCIFEROL) 1.25 MG (50000 UNIT) PO CAPS: 50000.0000 [IU] | ORAL_CAPSULE | ORAL | 2 refills | Status: AC

## 2024-10-16 NOTE — Patient Instructions (Signed)

## 2024-10-17 ENCOUNTER — Ambulatory Visit: Payer: Self-pay | Admitting: Nurse Practitioner

## 2024-10-17 LAB — CBC WITH DIFFERENTIAL/PLATELET
Basophils Absolute: 0 x10E3/uL (ref 0.0–0.2)
Basos: 1 %
EOS (ABSOLUTE): 0.2 x10E3/uL (ref 0.0–0.4)
Eos: 3 %
Hematocrit: 43.4 % (ref 37.5–51.0)
Hemoglobin: 14.2 g/dL (ref 13.0–17.7)
Immature Grans (Abs): 0 x10E3/uL (ref 0.0–0.1)
Immature Granulocytes: 0 %
Lymphocytes Absolute: 1.4 x10E3/uL (ref 0.7–3.1)
Lymphs: 22 %
MCH: 30.5 pg (ref 26.6–33.0)
MCHC: 32.7 g/dL (ref 31.5–35.7)
MCV: 93 fL (ref 79–97)
Monocytes Absolute: 0.7 x10E3/uL (ref 0.1–0.9)
Monocytes: 10 %
Neutrophils Absolute: 4.2 x10E3/uL (ref 1.4–7.0)
Neutrophils: 64 %
Platelets: 304 x10E3/uL (ref 150–450)
RBC: 4.65 x10E6/uL (ref 4.14–5.80)
RDW: 12.7 % (ref 11.6–15.4)
WBC: 6.6 x10E3/uL (ref 3.4–10.8)

## 2024-10-17 LAB — LIPID PANEL
Chol/HDL Ratio: 3.1 ratio (ref 0.0–5.0)
Cholesterol, Total: 119 mg/dL (ref 100–199)
HDL: 39 mg/dL — ABNORMAL LOW
LDL Chol Calc (NIH): 67 mg/dL (ref 0–99)
Triglycerides: 60 mg/dL (ref 0–149)
VLDL Cholesterol Cal: 13 mg/dL (ref 5–40)

## 2024-10-17 LAB — CMP14+EGFR
ALT: 15 IU/L (ref 0–44)
AST: 13 IU/L (ref 0–40)
Albumin: 4.2 g/dL (ref 3.8–4.9)
Alkaline Phosphatase: 108 IU/L (ref 47–123)
BUN/Creatinine Ratio: 11 (ref 10–24)
BUN: 14 mg/dL (ref 8–27)
Bilirubin Total: 0.7 mg/dL (ref 0.0–1.2)
CO2: 23 mmol/L (ref 20–29)
Calcium: 9.5 mg/dL (ref 8.6–10.2)
Chloride: 101 mmol/L (ref 96–106)
Creatinine, Ser: 1.23 mg/dL (ref 0.76–1.27)
Globulin, Total: 3.1 g/dL (ref 1.5–4.5)
Glucose: 137 mg/dL — ABNORMAL HIGH (ref 70–99)
Potassium: 4.5 mmol/L (ref 3.5–5.2)
Sodium: 140 mmol/L (ref 134–144)
Total Protein: 7.3 g/dL (ref 6.0–8.5)
eGFR: 67 mL/min/1.73

## 2024-10-17 LAB — VITAMIN D 25 HYDROXY (VIT D DEFICIENCY, FRACTURES): Vit D, 25-Hydroxy: 19 ng/mL — ABNORMAL LOW (ref 30.0–100.0)

## 2024-10-22 ENCOUNTER — Other Ambulatory Visit: Payer: Self-pay | Admitting: Family Medicine

## 2024-10-22 DIAGNOSIS — E1165 Type 2 diabetes mellitus with hyperglycemia: Secondary | ICD-10-CM

## 2024-10-25 ENCOUNTER — Ambulatory Visit: Payer: Self-pay

## 2024-10-25 NOTE — Telephone Encounter (Signed)
 FYI Only or Action Required?: Action required by provider: update on patient condition.  Patient was last seen in primary care on 10/16/2024 by Gladis Mustard, FNP.  Called Nurse Triage reporting Advice Only.   Triage Disposition: Information or Advice Only Call  Patient/caregiver understands and will follow disposition?: Unsure   Copied from CRM [id number]. Topic: Clinical - Lab/Test Results >> Oct 25, 2024  9:36 AM Emylou G wrote: Reason for CRM: need to go over A1C   Reason for Disposition  Health information question, no triage required and triager able to answer question  Answer Assessment - Initial Assessment Questions 1. REASON FOR CALL: What is the main reason for your call? or How can I best help you?     PAS contacted NT but did not transfer pt. 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 Caller is questioning where A1C 7.8% was coming from. Discussed with PAS, 7.8% is a previous lab from 06/21/24 but pt does have active orders for redraw per provider. Per my chart messages pt will need redraw prior to new orders or change in medications. Encouraged her to transfer caller for any further questions or concerns.  Protocols used: Information Only Call - No Triage-A-AH

## 2025-04-02 ENCOUNTER — Ambulatory Visit: Payer: Self-pay | Admitting: Nurse Practitioner
